# Patient Record
Sex: Male | Born: 1945 | Hispanic: Yes | Smoking: Never smoker
Health system: Southern US, Community
[De-identification: ages and names within clinical notes are randomized; demographics above are authoritative.]

## PROBLEM LIST (undated history)

## (undated) DIAGNOSIS — G20 Parkinson's disease: Secondary | ICD-10-CM

## (undated) DIAGNOSIS — C8590 Non-Hodgkin lymphoma, unspecified, unspecified site: Secondary | ICD-10-CM

## (undated) DIAGNOSIS — G20A1 Parkinson's disease without dyskinesia, without mention of fluctuations: Secondary | ICD-10-CM

## (undated) DIAGNOSIS — I10 Essential (primary) hypertension: Secondary | ICD-10-CM

---

## 2020-01-17 ENCOUNTER — Encounter (HOSPITAL_BASED_OUTPATIENT_CLINIC_OR_DEPARTMENT_OTHER): Payer: Self-pay | Admitting: *Deleted

## 2020-01-17 ENCOUNTER — Other Ambulatory Visit: Payer: Self-pay

## 2020-01-17 ENCOUNTER — Emergency Department (HOSPITAL_BASED_OUTPATIENT_CLINIC_OR_DEPARTMENT_OTHER): Payer: Medicare Other

## 2020-01-17 ENCOUNTER — Inpatient Hospital Stay (HOSPITAL_BASED_OUTPATIENT_CLINIC_OR_DEPARTMENT_OTHER)
Admission: EM | Admit: 2020-01-17 | Discharge: 2020-02-03 | DRG: 871 | Disposition: E | Payer: Medicare Other | Attending: Internal Medicine | Admitting: Internal Medicine

## 2020-01-17 DIAGNOSIS — R1310 Dysphagia, unspecified: Secondary | ICD-10-CM | POA: Diagnosis not present

## 2020-01-17 DIAGNOSIS — B3781 Candidal esophagitis: Secondary | ICD-10-CM | POA: Diagnosis not present

## 2020-01-17 DIAGNOSIS — U071 COVID-19: Secondary | ICD-10-CM | POA: Diagnosis not present

## 2020-01-17 DIAGNOSIS — I82462 Acute embolism and thrombosis of left calf muscular vein: Secondary | ICD-10-CM | POA: Diagnosis not present

## 2020-01-17 DIAGNOSIS — N179 Acute kidney failure, unspecified: Secondary | ICD-10-CM | POA: Diagnosis present

## 2020-01-17 DIAGNOSIS — E43 Unspecified severe protein-calorie malnutrition: Secondary | ICD-10-CM | POA: Insufficient documentation

## 2020-01-17 DIAGNOSIS — J1282 Pneumonia due to coronavirus disease 2019: Secondary | ICD-10-CM | POA: Diagnosis present

## 2020-01-17 DIAGNOSIS — A419 Sepsis, unspecified organism: Secondary | ICD-10-CM | POA: Diagnosis present

## 2020-01-17 DIAGNOSIS — R627 Adult failure to thrive: Secondary | ICD-10-CM | POA: Diagnosis present

## 2020-01-17 DIAGNOSIS — E860 Dehydration: Secondary | ICD-10-CM | POA: Diagnosis present

## 2020-01-17 DIAGNOSIS — G9349 Other encephalopathy: Secondary | ICD-10-CM | POA: Diagnosis not present

## 2020-01-17 DIAGNOSIS — N281 Cyst of kidney, acquired: Secondary | ICD-10-CM | POA: Diagnosis present

## 2020-01-17 DIAGNOSIS — E875 Hyperkalemia: Secondary | ICD-10-CM | POA: Diagnosis not present

## 2020-01-17 DIAGNOSIS — B370 Candidal stomatitis: Secondary | ICD-10-CM | POA: Diagnosis not present

## 2020-01-17 DIAGNOSIS — I5032 Chronic diastolic (congestive) heart failure: Secondary | ICD-10-CM | POA: Diagnosis present

## 2020-01-17 DIAGNOSIS — R6520 Severe sepsis without septic shock: Secondary | ICD-10-CM | POA: Diagnosis present

## 2020-01-17 DIAGNOSIS — D72823 Leukemoid reaction: Secondary | ICD-10-CM | POA: Diagnosis not present

## 2020-01-17 DIAGNOSIS — R680 Hypothermia, not associated with low environmental temperature: Secondary | ICD-10-CM | POA: Diagnosis not present

## 2020-01-17 DIAGNOSIS — Z781 Physical restraint status: Secondary | ICD-10-CM

## 2020-01-17 DIAGNOSIS — Z515 Encounter for palliative care: Secondary | ICD-10-CM

## 2020-01-17 DIAGNOSIS — D696 Thrombocytopenia, unspecified: Secondary | ICD-10-CM | POA: Diagnosis not present

## 2020-01-17 DIAGNOSIS — Z8572 Personal history of non-Hodgkin lymphomas: Secondary | ICD-10-CM

## 2020-01-17 DIAGNOSIS — J9601 Acute respiratory failure with hypoxia: Secondary | ICD-10-CM | POA: Diagnosis present

## 2020-01-17 DIAGNOSIS — Z0189 Encounter for other specified special examinations: Secondary | ICD-10-CM

## 2020-01-17 DIAGNOSIS — I110 Hypertensive heart disease with heart failure: Secondary | ICD-10-CM | POA: Diagnosis present

## 2020-01-17 DIAGNOSIS — D72829 Elevated white blood cell count, unspecified: Secondary | ICD-10-CM | POA: Diagnosis not present

## 2020-01-17 DIAGNOSIS — Z6827 Body mass index (BMI) 27.0-27.9, adult: Secondary | ICD-10-CM

## 2020-01-17 DIAGNOSIS — K922 Gastrointestinal hemorrhage, unspecified: Secondary | ICD-10-CM | POA: Diagnosis not present

## 2020-01-17 DIAGNOSIS — J189 Pneumonia, unspecified organism: Secondary | ICD-10-CM

## 2020-01-17 DIAGNOSIS — G9341 Metabolic encephalopathy: Secondary | ICD-10-CM | POA: Diagnosis not present

## 2020-01-17 DIAGNOSIS — Z66 Do not resuscitate: Secondary | ICD-10-CM | POA: Diagnosis not present

## 2020-01-17 DIAGNOSIS — K7290 Hepatic failure, unspecified without coma: Secondary | ICD-10-CM | POA: Diagnosis present

## 2020-01-17 DIAGNOSIS — I499 Cardiac arrhythmia, unspecified: Secondary | ICD-10-CM | POA: Diagnosis not present

## 2020-01-17 DIAGNOSIS — J9690 Respiratory failure, unspecified, unspecified whether with hypoxia or hypercapnia: Secondary | ICD-10-CM

## 2020-01-17 DIAGNOSIS — G20A1 Parkinson's disease without dyskinesia, without mention of fluctuations: Secondary | ICD-10-CM | POA: Diagnosis present

## 2020-01-17 DIAGNOSIS — Z79899 Other long term (current) drug therapy: Secondary | ICD-10-CM

## 2020-01-17 DIAGNOSIS — I10 Essential (primary) hypertension: Secondary | ICD-10-CM | POA: Diagnosis not present

## 2020-01-17 DIAGNOSIS — Y95 Nosocomial condition: Secondary | ICD-10-CM | POA: Diagnosis present

## 2020-01-17 DIAGNOSIS — R0602 Shortness of breath: Secondary | ICD-10-CM

## 2020-01-17 DIAGNOSIS — Z800 Family history of malignant neoplasm of digestive organs: Secondary | ICD-10-CM

## 2020-01-17 DIAGNOSIS — I4891 Unspecified atrial fibrillation: Secondary | ICD-10-CM | POA: Diagnosis not present

## 2020-01-17 DIAGNOSIS — A4189 Other specified sepsis: Principal | ICD-10-CM | POA: Diagnosis present

## 2020-01-17 DIAGNOSIS — Z4659 Encounter for fitting and adjustment of other gastrointestinal appliance and device: Secondary | ICD-10-CM

## 2020-01-17 DIAGNOSIS — E870 Hyperosmolality and hypernatremia: Secondary | ICD-10-CM | POA: Diagnosis not present

## 2020-01-17 DIAGNOSIS — I82811 Embolism and thrombosis of superficial veins of right lower extremities: Secondary | ICD-10-CM | POA: Diagnosis not present

## 2020-01-17 DIAGNOSIS — Z91041 Radiographic dye allergy status: Secondary | ICD-10-CM

## 2020-01-17 DIAGNOSIS — Z452 Encounter for adjustment and management of vascular access device: Secondary | ICD-10-CM

## 2020-01-17 DIAGNOSIS — G20 Parkinson's disease: Secondary | ICD-10-CM | POA: Diagnosis present

## 2020-01-17 DIAGNOSIS — E861 Hypovolemia: Secondary | ICD-10-CM | POA: Diagnosis not present

## 2020-01-17 DIAGNOSIS — R6521 Severe sepsis with septic shock: Secondary | ICD-10-CM | POA: Diagnosis not present

## 2020-01-17 HISTORY — DX: Essential (primary) hypertension: I10

## 2020-01-17 HISTORY — DX: Parkinson's disease: G20

## 2020-01-17 HISTORY — DX: Non-Hodgkin lymphoma, unspecified, unspecified site: C85.90

## 2020-01-17 HISTORY — DX: Parkinson's disease without dyskinesia, without mention of fluctuations: G20.A1

## 2020-01-17 LAB — CBC WITH DIFFERENTIAL/PLATELET
Abs Immature Granulocytes: 0.1 10*3/uL — ABNORMAL HIGH (ref 0.00–0.07)
Basophils Absolute: 0.1 10*3/uL (ref 0.0–0.1)
Basophils Relative: 0 %
Eosinophils Absolute: 0 10*3/uL (ref 0.0–0.5)
Eosinophils Relative: 0 %
HCT: 43.7 % (ref 39.0–52.0)
Hemoglobin: 15.2 g/dL (ref 13.0–17.0)
Immature Granulocytes: 1 %
Lymphocytes Relative: 4 %
Lymphs Abs: 0.6 10*3/uL — ABNORMAL LOW (ref 0.7–4.0)
MCH: 32.5 pg (ref 26.0–34.0)
MCHC: 34.8 g/dL (ref 30.0–36.0)
MCV: 93.4 fL (ref 80.0–100.0)
Monocytes Absolute: 1 10*3/uL (ref 0.1–1.0)
Monocytes Relative: 7 %
Neutro Abs: 13.2 10*3/uL — ABNORMAL HIGH (ref 1.7–7.7)
Neutrophils Relative %: 88 %
Platelets: 198 10*3/uL (ref 150–400)
RBC: 4.68 MIL/uL (ref 4.22–5.81)
RDW: 13.9 % (ref 11.5–15.5)
WBC: 15 10*3/uL — ABNORMAL HIGH (ref 4.0–10.5)
nRBC: 0 % (ref 0.0–0.2)

## 2020-01-17 LAB — D-DIMER, QUANTITATIVE: D-Dimer, Quant: 1.28 ug/mL-FEU — ABNORMAL HIGH (ref 0.00–0.50)

## 2020-01-17 LAB — FIBRINOGEN: Fibrinogen: >800 mg/dL — ABNORMAL HIGH (ref 210–475)

## 2020-01-17 LAB — COMPREHENSIVE METABOLIC PANEL
ALT: 6 U/L (ref 0–44)
AST: 40 U/L (ref 15–41)
Albumin: 3.7 g/dL (ref 3.5–5.0)
Alkaline Phosphatase: 175 U/L — ABNORMAL HIGH (ref 38–126)
Anion gap: 16 — ABNORMAL HIGH (ref 5–15)
BUN: 30 mg/dL — ABNORMAL HIGH (ref 8–23)
CO2: 19 mmol/L — ABNORMAL LOW (ref 22–32)
Calcium: 8.8 mg/dL — ABNORMAL LOW (ref 8.9–10.3)
Chloride: 98 mmol/L (ref 98–111)
Creatinine, Ser: 1.36 mg/dL — ABNORMAL HIGH (ref 0.61–1.24)
GFR calc Af Amer: 59 mL/min — ABNORMAL LOW (ref >60)
GFR calc non Af Amer: 51 mL/min — ABNORMAL LOW (ref >60)
Glucose, Bld: 162 mg/dL — ABNORMAL HIGH (ref 70–99)
Potassium: 3.7 mmol/L (ref 3.5–5.1)
Sodium: 133 mmol/L — ABNORMAL LOW (ref 135–145)
Total Bilirubin: 2.1 mg/dL — ABNORMAL HIGH (ref 0.3–1.2)
Total Protein: 7.5 g/dL (ref 6.5–8.1)

## 2020-01-17 LAB — LACTIC ACID, PLASMA
Lactic Acid, Venous: 4.3 mmol/L (ref 0.5–1.9)
Lactic Acid, Venous: 1.8 mmol/L (ref 0.5–1.9)

## 2020-01-17 LAB — TRIGLYCERIDES: Triglycerides: 87 mg/dL (ref <150)

## 2020-01-17 LAB — PROCALCITONIN: Procalcitonin: 2.55 ng/mL

## 2020-01-17 LAB — C-REACTIVE PROTEIN: CRP: 34.6 mg/dL — ABNORMAL HIGH (ref <1.0)

## 2020-01-17 LAB — SARS CORONAVIRUS 2 AG (30 MIN TAT): SARS Coronavirus 2 Ag: POSITIVE — AB

## 2020-01-17 LAB — LACTATE DEHYDROGENASE: LDH: 327 U/L — ABNORMAL HIGH (ref 98–192)

## 2020-01-17 LAB — FERRITIN: Ferritin: 571 ng/mL — ABNORMAL HIGH (ref 24–336)

## 2020-01-17 LAB — BRAIN NATRIURETIC PEPTIDE: B Natriuretic Peptide: 154.9 pg/mL — ABNORMAL HIGH (ref 0.0–100.0)

## 2020-01-17 MED ORDER — SODIUM CHLORIDE 0.9 % IV SOLN
100.0000 mg | Freq: Every day | INTRAVENOUS | Status: AC
  Administered 2020-01-18 – 2020-01-21 (×4): 100 mg via INTRAVENOUS
  Filled 2020-01-17 (×4): qty 20

## 2020-01-17 MED ORDER — SODIUM CHLORIDE 0.9 % IV BOLUS (SEPSIS)
500.0000 mL | Freq: Once | INTRAVENOUS | Status: AC
  Administered 2020-01-17: 500 mL via INTRAVENOUS

## 2020-01-17 MED ORDER — SODIUM CHLORIDE 0.9 % IV SOLN
100.0000 mg | INTRAVENOUS | Status: AC
  Administered 2020-01-17 (×2): 100 mg via INTRAVENOUS
  Filled 2020-01-17 (×2): qty 20

## 2020-01-17 MED ORDER — SODIUM CHLORIDE 0.9 % IV SOLN
1000.0000 mL | INTRAVENOUS | Status: DC
  Administered 2020-01-17: 1000 mL via INTRAVENOUS

## 2020-01-17 MED ORDER — ACETAMINOPHEN 500 MG PO TABS
ORAL_TABLET | ORAL | Status: AC
  Filled 2020-01-17: qty 2

## 2020-01-17 MED ORDER — ACETAMINOPHEN 500 MG PO TABS
1000.0000 mg | ORAL_TABLET | Freq: Once | ORAL | Status: AC
  Administered 2020-01-17: 1000 mg via ORAL

## 2020-01-17 MED ORDER — DEXAMETHASONE SODIUM PHOSPHATE 10 MG/ML IJ SOLN
10.0000 mg | Freq: Once | INTRAMUSCULAR | Status: AC
  Administered 2020-01-17: 10 mg via INTRAVENOUS
  Filled 2020-01-17: qty 1

## 2020-01-17 NOTE — ED Triage Notes (Signed)
Daughter states family at home with Covid, pts SOB x 2 days

## 2020-01-17 NOTE — Progress Notes (Signed)
Patient's SPO2 is 84%, and RR of 40 while on4 liter nasal upon arrival to room 11.  Placed patient on 10 liter Hi-Flo nasal cannula.  SPO2 increased to 97% and RR decreased to 30.  Patient's WOB has also decreased.

## 2020-01-17 NOTE — ED Provider Notes (Signed)
[facility] EMERGENCY DEPARTMENT Provider Note   CSN: [id number] Arrival date & time: 01/23/2020  1921     History Chief Complaint  Patient presents with  . Shortness of Breath    Christopher Travis is a 74 y.o. male.  HPI Patient moved to the [place] area from [place] about 3 weeks ago.  He is family from [place] came to help with the move.  Unfortunately, his daughter had contracted Covid from her daughter just before coming to their home.  All the family members have now contracted Covid.  Patient reports symptoms started about 5 to 7 days ago.  He has had a harsh cough.  He denies significant amount of chest pain.  He has had fever and body aches.  He denies he has been vomiting.  He reports he is continue to eat small amounts.  He has had some associated diarrhea.  Patient's daughter reports they have been using the pulse oximeter and his oxygen level has started to go down at home when he was getting down to the lower 80s.  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  He is treated for Parkinson's disease.  At baseline however he is active and living independently with his wife.  The majority of patients healthcare records are within the [facility].    Past Medical History:  Diagnosis Date  . Hypertension   . Non Hodgkin's lymphoma ([place])   . Parkinson disease ([place])     There are no problems to display for this patient.   History reviewed. No pertinent surgical history.     No family history on file.  Social History   Tobacco Use  . Smoking status: Not on file  Substance Use Topics  . Alcohol use: Not on file  . Drug use: Not on file    Home Medications Prior to Admission medications   Medication Sig Start Date End Date Taking? Authorizing Provider  amLODipine-valsartan (EXFORGE) 10-320 MG tablet Take 1 tablet by mouth daily.   Yes [provider]  carbidopa-levodopa (SINEMET IR) 25-100 MG  tablet Take 1 tablet by mouth 3 (three) times daily.   Yes [provider]  rOPINIRole (REQUIP) 2 MG tablet Take 2 mg by mouth at bedtime.   Yes [provider]    Allergies    Patient has no known allergies.  Review of Systems   Review of Systems 10 Systems reviewed and are negative for acute change except as noted in the HPI.  Physical Exam Updated Vital Signs BP (!) 141/77   Pulse 88   Temp (!) 102.5 F (39.2 C) (Oral)   Resp (!) 26   Ht 5\' 11"  (1.803 m)   Wt 88.9 kg   SpO2 99%   BMI 27.34 kg/m   Physical Exam Constitutional:      Comments: Patient is alert.  Mental status clear.  Moderate tachypnea.  Speaking in full short sentences.  HENT:     Head: Normocephalic and atraumatic.  Eyes:     Extraocular Movements: Extraocular movements intact.  Cardiovascular:     Comments: Tachycardia.  No gross rub murmur gallop. Pulmonary:     Comments: Mild to moderate increased work of breathing.  Crackles diffusely throughout the lung fields. Abdominal:     General: There is no distension.     Palpations: Abdomen is soft.     Tenderness: There is no abdominal tenderness. There is no guarding.  Musculoskeletal:     Comments: Large varicosity of the right lower extremity.  Brawny skin changes of the lower leg consistent with chronic venous stasis.  Left leg normal.  Skin:    General: Skin is warm and dry.  Neurological:     General: No focal deficit present.     Mental Status: He is oriented to person, place, and time.     Coordination: Coordination normal.  Psychiatric:        Mood and Affect: Mood normal.     ED Results / Procedures / Treatments   Labs (all labs ordered are listed, but only abnormal results are displayed) Labs Reviewed  SARS CORONAVIRUS 2 AG (30 MIN TAT) - Abnormal; Notable for the following components:      Result Value   SARS Coronavirus 2 Ag POSITIVE (*)    All other components within normal limits  CBC WITH  DIFFERENTIAL/PLATELET - Abnormal; Notable for the following components:   WBC 15.0 (*)    Neutro Abs 13.2 (*)    Lymphs Abs 0.6 (*)    Abs Immature Granulocytes 0.10 (*)    All other components within normal limits  COMPREHENSIVE METABOLIC PANEL - Abnormal; Notable for the following components:   Sodium 133 (*)    CO2 19 (*)    Glucose, Bld 162 (*)    BUN 30 (*)    Creatinine, Ser 1.36 (*)    Calcium 8.8 (*)    Alkaline Phosphatase 175 (*)    Total Bilirubin 2.1 (*)    GFR calc non Af Amer 51 (*)    GFR calc Af Amer 59 (*)    Anion gap 16 (*)    All other components within normal limits  LACTIC ACID, PLASMA - Abnormal; Notable for the following components:   Lactic Acid, Venous 4.3 (*)    All other components within normal limits  D-DIMER, QUANTITATIVE (NOT AT [facility]) - Abnormal; Notable for the following components:   D-Dimer, Quant 1.28 (*)    All other components within normal limits  CULTURE, BLOOD (ROUTINE X 2)  CULTURE, BLOOD (ROUTINE X 2)  LACTIC ACID, PLASMA  PROCALCITONIN  LACTATE DEHYDROGENASE  FERRITIN  TRIGLYCERIDES  FIBRINOGEN  C-REACTIVE PROTEIN  BRAIN NATRIURETIC PEPTIDE    EKG None EKG will not import from use.  Sinus rhythm 98.  PACs.  No acute ischemic changes. No old comparison available. Ventricular rate 98 QRS 96 QTC 460 Radiolo DG Chest Portable 1 View  Result Date: 01/18/2020 CLINICAL DATA:  Shortness of breath. COVID exposure at home. EXAM: PORTABLE CHEST 1 VIEW COMPARISON:  None. FINDINGS: Heterogeneous patchy bilateral airspace opacities, left greater than right. Heart is normal in size with normal mediastinal contours. No pleural fluid or pneumothorax. No acute osseous abnormalities are seen. IMPRESSION: Heterogeneous patchy bilateral airspace opacities, left greater than right. Findings suspicious for multifocal pneumonia, pattern typical of COVID pneumonia. Electronically Signed   By: Keith Rake M.D.   On: 01/22/2020 20:13     Procedures Procedures (including critical care time) CRITICAL CARE Performed by: Charlesetta Shanks   Total critical care time: 30 minutes  Critical care time was exclusive of separately billable procedures and treating other patients.  Critical care was necessary to treat or prevent imminent or life-threatening deterioration.  Critical care was time spent personally by me on the following activities: development of treatment plan with patient and/or surrogate as well as nursing, discussions with consultants, evaluation of patient's response to treatment, examination of patient, obtaining  history from patient or surrogate, ordering and performing treatments and interventions, ordering and review of laboratory studies, ordering and review of radiographic studies, pulse oximetry and re-evaluation of patient's condition. Medications Ordered in ED Medications  sodium chloride 0.9 % bolus 500 mL (500 mLs Intravenous New Bag/Given 02/02/2020 2032)    Followed by  0.9 %  sodium chloride infusion (has no administration in time range)  dexamethasone (DECADRON) injection 10 mg (has no administration in time range)  acetaminophen (TYLENOL) tablet 1,000 mg (1,000 mg Oral Given 01/10/2020 1937)    ED Course  I have reviewed the triage vital signs and the nursing notes.  Pertinent labs & imaging results that were available during my care of the patient were reviewed by me and considered in my medical decision making (see chart for details).    MDM Rules/Calculators/A&P                     Patient presents with symptoms of Covid pneumonia.  He has had direct exposure to positive family members.  Patient is hypoxic on arrival is 78%.  He has improved on high flow supplemental oxygen.  Patient's mental status is clear.  Chest x-ray is consistent with bilateral pneumonia.  Patient is started on remdesivir and Decadron in the emergency department.  Lactic acid is elevated and there is some renal  insufficiency.  Patient is given a 500 cc saline bolus and fluids started at 125 an hour.  Will repeat lactic acid.  Patient's mental status is good.  Extremities are warm and dry.  Heart rate and blood pressure are normal.  Patient has history of non-Hodgkin's lymphoma which has been in remission for greater than 3 years.  Patient has Parkinson's disease but lives independently with his wife and recently moved into a new home in the [place] area.  I reviewed the plan with patient's daughter Chrys Racer.  Her number is [phone].  She wants updates when available.  Patient will be admitted to [facility]. Final Clinical Impression(s) / ED Diagnoses Final diagnoses:  Pneumonia due to COVID-19 virus    Rx / DC Orders ED Discharge Orders    None       Charlesetta Shanks, MD 01/16/2020 2146

## 2020-01-17 NOTE — ED Notes (Signed)
ED Provider at bedside. 

## 2020-01-17 NOTE — ED Notes (Signed)
Date and time results received: 01/14/2020 2018 (use smartphrase ".now" to insert current time)  Test: lactic acid Critical Value: 4.3  Name of Provider Notified: Dr. Johnney Killian  Orders Received? Or Actions Taken?: no new orders

## 2020-01-17 NOTE — ED Notes (Signed)
Notified daughter by Phone of pts condition and plan.

## 2020-01-17 NOTE — ED Notes (Signed)
Pfifer MD made aware of pts condition and 02 sat

## 2020-01-18 ENCOUNTER — Encounter (HOSPITAL_COMMUNITY): Payer: Self-pay | Admitting: Family Medicine

## 2020-01-18 ENCOUNTER — Telehealth (HOSPITAL_BASED_OUTPATIENT_CLINIC_OR_DEPARTMENT_OTHER): Payer: Self-pay | Admitting: Emergency Medicine

## 2020-01-18 DIAGNOSIS — I110 Hypertensive heart disease with heart failure: Secondary | ICD-10-CM | POA: Diagnosis present

## 2020-01-18 DIAGNOSIS — G20 Parkinson's disease: Secondary | ICD-10-CM | POA: Diagnosis not present

## 2020-01-18 DIAGNOSIS — I10 Essential (primary) hypertension: Secondary | ICD-10-CM | POA: Diagnosis not present

## 2020-01-18 DIAGNOSIS — G20A1 Parkinson's disease without dyskinesia, without mention of fluctuations: Secondary | ICD-10-CM | POA: Diagnosis present

## 2020-01-18 DIAGNOSIS — E43 Unspecified severe protein-calorie malnutrition: Secondary | ICD-10-CM | POA: Diagnosis present

## 2020-01-18 DIAGNOSIS — N179 Acute kidney failure, unspecified: Secondary | ICD-10-CM | POA: Diagnosis present

## 2020-01-18 DIAGNOSIS — B370 Candidal stomatitis: Secondary | ICD-10-CM | POA: Diagnosis not present

## 2020-01-18 DIAGNOSIS — Y95 Nosocomial condition: Secondary | ICD-10-CM | POA: Diagnosis present

## 2020-01-18 DIAGNOSIS — J9601 Acute respiratory failure with hypoxia: Secondary | ICD-10-CM | POA: Diagnosis present

## 2020-01-18 DIAGNOSIS — Z800 Family history of malignant neoplasm of digestive organs: Secondary | ICD-10-CM | POA: Diagnosis not present

## 2020-01-18 DIAGNOSIS — Z8572 Personal history of non-Hodgkin lymphomas: Secondary | ICD-10-CM | POA: Diagnosis not present

## 2020-01-18 DIAGNOSIS — Z515 Encounter for palliative care: Secondary | ICD-10-CM | POA: Diagnosis not present

## 2020-01-18 DIAGNOSIS — E870 Hyperosmolality and hypernatremia: Secondary | ICD-10-CM | POA: Diagnosis not present

## 2020-01-18 DIAGNOSIS — U071 COVID-19: Secondary | ICD-10-CM | POA: Diagnosis present

## 2020-01-18 DIAGNOSIS — G9349 Other encephalopathy: Secondary | ICD-10-CM | POA: Diagnosis not present

## 2020-01-18 DIAGNOSIS — R7989 Other specified abnormal findings of blood chemistry: Secondary | ICD-10-CM | POA: Diagnosis not present

## 2020-01-18 DIAGNOSIS — I82811 Embolism and thrombosis of superficial veins of right lower extremities: Secondary | ICD-10-CM | POA: Diagnosis not present

## 2020-01-18 DIAGNOSIS — Z7189 Other specified counseling: Secondary | ICD-10-CM | POA: Diagnosis not present

## 2020-01-18 DIAGNOSIS — R6521 Severe sepsis with septic shock: Secondary | ICD-10-CM | POA: Diagnosis not present

## 2020-01-18 DIAGNOSIS — G9341 Metabolic encephalopathy: Secondary | ICD-10-CM | POA: Diagnosis not present

## 2020-01-18 DIAGNOSIS — N171 Acute kidney failure with acute cortical necrosis: Secondary | ICD-10-CM | POA: Diagnosis not present

## 2020-01-18 DIAGNOSIS — A4189 Other specified sepsis: Secondary | ICD-10-CM | POA: Diagnosis present

## 2020-01-18 DIAGNOSIS — A419 Sepsis, unspecified organism: Secondary | ICD-10-CM | POA: Diagnosis present

## 2020-01-18 DIAGNOSIS — J1282 Pneumonia due to coronavirus disease 2019: Secondary | ICD-10-CM | POA: Diagnosis present

## 2020-01-18 DIAGNOSIS — K922 Gastrointestinal hemorrhage, unspecified: Secondary | ICD-10-CM | POA: Diagnosis not present

## 2020-01-18 DIAGNOSIS — I82462 Acute embolism and thrombosis of left calf muscular vein: Secondary | ICD-10-CM | POA: Diagnosis not present

## 2020-01-18 DIAGNOSIS — R9431 Abnormal electrocardiogram [ECG] [EKG]: Secondary | ICD-10-CM | POA: Diagnosis not present

## 2020-01-18 DIAGNOSIS — I5032 Chronic diastolic (congestive) heart failure: Secondary | ICD-10-CM | POA: Diagnosis present

## 2020-01-18 DIAGNOSIS — J189 Pneumonia, unspecified organism: Secondary | ICD-10-CM | POA: Diagnosis not present

## 2020-01-18 DIAGNOSIS — Z66 Do not resuscitate: Secondary | ICD-10-CM | POA: Diagnosis not present

## 2020-01-18 DIAGNOSIS — B3781 Candidal esophagitis: Secondary | ICD-10-CM | POA: Diagnosis not present

## 2020-01-18 DIAGNOSIS — Z6827 Body mass index (BMI) 27.0-27.9, adult: Secondary | ICD-10-CM | POA: Diagnosis not present

## 2020-01-18 DIAGNOSIS — Z79899 Other long term (current) drug therapy: Secondary | ICD-10-CM | POA: Diagnosis not present

## 2020-01-18 LAB — COMPREHENSIVE METABOLIC PANEL
ALT: 8 U/L (ref 0–44)
AST: 32 U/L (ref 15–41)
Albumin: 3.3 g/dL — ABNORMAL LOW (ref 3.5–5.0)
Alkaline Phosphatase: 147 U/L — ABNORMAL HIGH (ref 38–126)
Anion gap: 11 (ref 5–15)
BUN: 32 mg/dL — ABNORMAL HIGH (ref 8–23)
CO2: 21 mmol/L — ABNORMAL LOW (ref 22–32)
Calcium: 8.4 mg/dL — ABNORMAL LOW (ref 8.9–10.3)
Chloride: 105 mmol/L (ref 98–111)
Creatinine, Ser: 1.05 mg/dL (ref 0.61–1.24)
GFR calc Af Amer: >60 mL/min (ref >60)
GFR calc non Af Amer: >60 mL/min (ref >60)
Glucose, Bld: 191 mg/dL — ABNORMAL HIGH (ref 70–99)
Potassium: 4.1 mmol/L (ref 3.5–5.1)
Sodium: 137 mmol/L (ref 135–145)
Total Bilirubin: 1 mg/dL (ref 0.3–1.2)
Total Protein: 6.8 g/dL (ref 6.5–8.1)

## 2020-01-18 LAB — CBC WITH DIFFERENTIAL/PLATELET
Abs Immature Granulocytes: 0.05 10*3/uL (ref 0.00–0.07)
Basophils Absolute: 0 10*3/uL (ref 0.0–0.1)
Basophils Relative: 0 %
Eosinophils Absolute: 0 10*3/uL (ref 0.0–0.5)
Eosinophils Relative: 0 %
HCT: 41.1 % (ref 39.0–52.0)
Hemoglobin: 14.3 g/dL (ref 13.0–17.0)
Immature Granulocytes: 1 %
Lymphocytes Relative: 3 %
Lymphs Abs: 0.3 10*3/uL — ABNORMAL LOW (ref 0.7–4.0)
MCH: 32.1 pg (ref 26.0–34.0)
MCHC: 34.8 g/dL (ref 30.0–36.0)
MCV: 92.4 fL (ref 80.0–100.0)
Monocytes Absolute: 0.4 10*3/uL (ref 0.1–1.0)
Monocytes Relative: 4 %
Neutro Abs: 9.4 10*3/uL — ABNORMAL HIGH (ref 1.7–7.7)
Neutrophils Relative %: 92 %
Platelets: 137 10*3/uL — ABNORMAL LOW (ref 150–400)
RBC: 4.45 MIL/uL (ref 4.22–5.81)
RDW: 13.8 % (ref 11.5–15.5)
WBC: 10.2 10*3/uL (ref 4.0–10.5)
nRBC: 0 % (ref 0.0–0.2)

## 2020-01-18 LAB — GLUCOSE, CAPILLARY
Glucose-Capillary: 182 mg/dL — ABNORMAL HIGH (ref 70–99)
Glucose-Capillary: 155 mg/dL — ABNORMAL HIGH (ref 70–99)
Glucose-Capillary: 221 mg/dL — ABNORMAL HIGH (ref 70–99)
Glucose-Capillary: 120 mg/dL — ABNORMAL HIGH (ref 70–99)

## 2020-01-18 LAB — D-DIMER, QUANTITATIVE: D-Dimer, Quant: 1.08 ug/mL-FEU — ABNORMAL HIGH (ref 0.00–0.50)

## 2020-01-18 LAB — C-REACTIVE PROTEIN: CRP: 31 mg/dL — ABNORMAL HIGH (ref <1.0)

## 2020-01-18 LAB — ABO/RH: ABO/RH(D): A POS

## 2020-01-18 LAB — FERRITIN: Ferritin: 604 ng/mL — ABNORMAL HIGH (ref 24–336)

## 2020-01-18 LAB — PROCALCITONIN: Procalcitonin: 3.7 ng/mL

## 2020-01-18 MED ORDER — ONDANSETRON HCL 4 MG/2ML IJ SOLN
4.0000 mg | Freq: Four times a day (QID) | INTRAMUSCULAR | Status: DC | PRN
  Administered 2020-01-27: 09:00:00 4 mg via INTRAVENOUS
  Filled 2020-01-18: qty 2

## 2020-01-18 MED ORDER — SODIUM CHLORIDE 0.9 % IV BOLUS
250.0000 mL | Freq: Once | INTRAVENOUS | Status: AC
  Administered 2020-01-18: 250 mL via INTRAVENOUS

## 2020-01-18 MED ORDER — SODIUM CHLORIDE 0.9 % IV SOLN: 250.0000 mL | INTRAVENOUS | Status: DC | PRN

## 2020-01-18 MED ORDER — SODIUM CHLORIDE 0.9% IV SOLUTION: Freq: Once | INTRAVENOUS | Status: AC

## 2020-01-18 MED ORDER — DEXAMETHASONE SODIUM PHOSPHATE 10 MG/ML IJ SOLN
6.0000 mg | INTRAMUSCULAR | Status: AC
  Administered 2020-01-18 – 2020-01-27 (×10): 6 mg via INTRAVENOUS
  Filled 2020-01-18 (×10): qty 1

## 2020-01-18 MED ORDER — INSULIN ASPART 100 UNIT/ML ~~LOC~~ SOLN
0.0000 [IU] | Freq: Three times a day (TID) | SUBCUTANEOUS | Status: DC
  Administered 2020-01-18: 13:00:00 3 [IU] via SUBCUTANEOUS
  Administered 2020-01-18 – 2020-01-19 (×3): 2 [IU] via SUBCUTANEOUS
  Administered 2020-01-19: 1 [IU] via SUBCUTANEOUS
  Administered 2020-01-20: 2 [IU] via SUBCUTANEOUS
  Administered 2020-01-20: 1 [IU] via SUBCUTANEOUS
  Administered 2020-01-20: 5 [IU] via SUBCUTANEOUS
  Administered 2020-01-21: 2 [IU] via SUBCUTANEOUS
  Administered 2020-01-21 (×2): 3 [IU] via SUBCUTANEOUS
  Administered 2020-01-22: 2 [IU] via SUBCUTANEOUS
  Administered 2020-01-22: 3 [IU] via SUBCUTANEOUS
  Administered 2020-01-22 – 2020-01-23 (×2): 2 [IU] via SUBCUTANEOUS
  Administered 2020-01-23: 5 [IU] via SUBCUTANEOUS
  Administered 2020-01-23 – 2020-01-25 (×5): 2 [IU] via SUBCUTANEOUS
  Administered 2020-01-25: 3 [IU] via SUBCUTANEOUS
  Administered 2020-01-25: 2 [IU] via SUBCUTANEOUS
  Administered 2020-01-26 (×3): 1 [IU] via SUBCUTANEOUS
  Administered 2020-01-27 (×3): 2 [IU] via SUBCUTANEOUS
  Administered 2020-01-28: 1 [IU] via SUBCUTANEOUS
  Administered 2020-01-28: 2 [IU] via SUBCUTANEOUS
  Administered 2020-01-28: 17:00:00 1 [IU] via SUBCUTANEOUS
  Administered 2020-01-29: 08:00:00 2 [IU] via SUBCUTANEOUS
  Administered 2020-01-29 (×2): 1 [IU] via SUBCUTANEOUS

## 2020-01-18 MED ORDER — CARBIDOPA-LEVODOPA 25-100 MG PO TABS
1.0000 | ORAL_TABLET | Freq: Once | ORAL | Status: AC
  Administered 2020-01-18: 1 via ORAL
  Filled 2020-01-18: qty 1

## 2020-01-18 MED ORDER — ROPINIROLE HCL 1 MG PO TABS
3.0000 mg | ORAL_TABLET | ORAL | Status: DC
  Administered 2020-01-19 – 2020-01-28 (×7): 3 mg via ORAL
  Filled 2020-01-18 (×16): qty 3

## 2020-01-18 MED ORDER — CARBIDOPA-LEVODOPA 25-100 MG PO TABS: 1.0000 | ORAL_TABLET | Freq: Three times a day (TID) | ORAL | Status: DC

## 2020-01-18 MED ORDER — CARBIDOPA-LEVODOPA 25-100 MG PO TABS
2.0000 | ORAL_TABLET | ORAL | Status: DC
  Administered 2020-01-18 – 2020-01-26 (×17): 2 via ORAL
  Filled 2020-01-18 (×31): qty 2

## 2020-01-18 MED ORDER — GUAIFENESIN-DM 100-10 MG/5ML PO SYRP
10.0000 mL | ORAL_SOLUTION | ORAL | Status: DC | PRN
  Administered 2020-01-18 – 2020-01-20 (×2): 10 mL via ORAL
  Filled 2020-01-18 (×2): qty 10

## 2020-01-18 MED ORDER — SENNOSIDES-DOCUSATE SODIUM 8.6-50 MG PO TABS: 1.0000 | ORAL_TABLET | Freq: Every evening | ORAL | Status: DC | PRN

## 2020-01-18 MED ORDER — CARBIDOPA-LEVODOPA 25-100 MG PO TABS
1.0000 | ORAL_TABLET | Freq: Three times a day (TID) | ORAL | Status: DC
  Filled 2020-01-18 (×2): qty 1

## 2020-01-18 MED ORDER — CARBIDOPA-LEVODOPA 25-100 MG PO TABS
1.0000 | ORAL_TABLET | Freq: Every day | ORAL | Status: DC
  Administered 2020-01-18 – 2020-01-22 (×5): 1 via ORAL
  Filled 2020-01-18 (×10): qty 1

## 2020-01-18 MED ORDER — SODIUM CHLORIDE 0.9 % IV SOLN
2.0000 g | INTRAVENOUS | Status: AC
  Administered 2020-01-18 – 2020-01-22 (×5): 2 g via INTRAVENOUS
  Filled 2020-01-18 (×5): qty 20

## 2020-01-18 MED ORDER — CARBIDOPA-LEVODOPA ER 50-200 MG PO TBCR
2.0000 | EXTENDED_RELEASE_TABLET | Freq: Every day | ORAL | Status: DC
  Administered 2020-01-18 – 2020-01-22 (×4): 2 via ORAL
  Filled 2020-01-18 (×9): qty 2

## 2020-01-18 MED ORDER — CARBIDOPA-LEVODOPA 25-100 MG PO TABS
2.0000 | ORAL_TABLET | Freq: Three times a day (TID) | ORAL | Status: DC
  Filled 2020-01-18: qty 2

## 2020-01-18 MED ORDER — ROPINIROLE HCL 1 MG PO TABS
3.0000 mg | ORAL_TABLET | Freq: Once | ORAL | Status: AC
  Administered 2020-01-18: 3 mg via ORAL
  Filled 2020-01-18: qty 3

## 2020-01-18 MED ORDER — ENSURE ENLIVE PO LIQD
237.0000 mL | Freq: Three times a day (TID) | ORAL | Status: DC
  Administered 2020-01-18 – 2020-01-27 (×16): 237 mL via ORAL

## 2020-01-18 MED ORDER — ENOXAPARIN SODIUM 40 MG/0.4ML ~~LOC~~ SOLN
40.0000 mg | SUBCUTANEOUS | Status: DC
  Administered 2020-01-18 – 2020-01-20 (×3): 40 mg via SUBCUTANEOUS
  Filled 2020-01-18 (×3): qty 0.4

## 2020-01-18 MED ORDER — SODIUM CHLORIDE 0.9% FLUSH
3.0000 mL | Freq: Two times a day (BID) | INTRAVENOUS | Status: DC
  Administered 2020-01-18 – 2020-01-29 (×22): 3 mL via INTRAVENOUS

## 2020-01-18 MED ORDER — LIP MEDEX EX OINT
TOPICAL_OINTMENT | CUTANEOUS | Status: DC | PRN
  Administered 2020-01-28: 1 via TOPICAL
  Filled 2020-01-18 (×3): qty 7

## 2020-01-18 MED ORDER — SODIUM CHLORIDE 0.9% FLUSH
3.0000 mL | Freq: Two times a day (BID) | INTRAVENOUS | Status: DC
  Administered 2020-01-18 – 2020-01-29 (×20): 3 mL via INTRAVENOUS

## 2020-01-18 MED ORDER — SODIUM CHLORIDE 0.9 % IV SOLN
500.0000 mg | INTRAVENOUS | Status: AC
  Administered 2020-01-18 – 2020-01-22 (×5): 500 mg via INTRAVENOUS
  Filled 2020-01-18 (×5): qty 500

## 2020-01-18 MED ORDER — ROPINIROLE HCL 1 MG PO TABS
2.0000 mg | ORAL_TABLET | Freq: Every day | ORAL | Status: DC
  Filled 2020-01-18: qty 2

## 2020-01-18 MED ORDER — SODIUM CHLORIDE 0.9% FLUSH: 3.0000 mL | INTRAVENOUS | Status: DC | PRN

## 2020-01-18 MED ORDER — ONDANSETRON HCL 4 MG PO TABS: 4.0000 mg | ORAL_TABLET | Freq: Four times a day (QID) | ORAL | Status: DC | PRN

## 2020-01-18 MED ORDER — ACETAMINOPHEN 325 MG PO TABS: 650.0000 mg | ORAL_TABLET | Freq: Four times a day (QID) | ORAL | Status: DC | PRN

## 2020-01-18 MED ORDER — HYDROCODONE-ACETAMINOPHEN 5-325 MG PO TABS: 1.0000 | ORAL_TABLET | Freq: Four times a day (QID) | ORAL | Status: DC | PRN

## 2020-01-18 MED ORDER — CARBIDOPA-LEVODOPA 25-100 MG PO TABS
1.0000 | ORAL_TABLET | Freq: Three times a day (TID) | ORAL | Status: DC
  Administered 2020-01-18 (×2): 1 via ORAL
  Filled 2020-01-18 (×3): qty 1

## 2020-01-18 NOTE — Progress Notes (Signed)
Called pt daughter to get clarification on pt Sinemet dosage and times. She gave correct dosing and times for all pt home meds. Those doses and times were relayed to MD. He asked that I contact pharmacy to have it corrected. Called pharmacy and gave her the info and pharmacy said they will correct it in [facility].

## 2020-01-18 NOTE — Progress Notes (Signed)
Initial Nutrition Assessment  RD working remotely.  DOCUMENTATION CODES:   Not applicable  INTERVENTION:  Provide Ensure Enlive po TID, each supplement provides 350 kcal and 20 grams of protein.  NUTRITION DIAGNOSIS:   Increased nutrient needs related to catabolic [id number] as evidenced by estimated needs.  GOAL:   Patient will meet greater than or equal to 90% of their needs  MONITOR:   PO intake, Supplement acceptance, Labs, Weight trends, I & O's  REASON FOR ASSESSMENT:   Malnutrition Screening Tool    ASSESSMENT:   74 year old male with PMHx of Parkinson's disease, Non Hodgkin's lymphoma in remission, HTN admitted with PNA due to COVID-19, sepsis, AKI.   Patient is Spanish-speaking. Attempted to call patient with phone interpreter service (ID # [phone]) but patient was unable to answer the phone. Patient is currently on soft diet with 1.2 L fluid restriction. According to chart patient ate 25% of his breakfast and lunch today. Patient has increased calorie/protein needs from COVID-19 and would benefit from oral nutrition supplements.  No weight history available in chart to trend. Patient is currently 85.8 kg (189.15 lbs).  Medications reviewed and include: Decadron 6 mg Q24hrs, Novolog 0-9 units TID, azithromycin, ceftriaxone, remdesivir.  Labs reviewed: CBG 120-221, CO2 21, BUN 32.  NUTRITION - FOCUSED PHYSICAL EXAM:  Unable to complete as RD working remotely.  Diet Order:   Diet Order            DIET SOFT Room service appropriate? Yes; Fluid consistency: Thin; Fluid restriction: 1200 mL Fluid  Diet effective now             EDUCATION NEEDS:   No education needs have been identified at this time  Skin:  Skin Assessment: Reviewed RN Assessment  Last BM:  01/18/2020 medium type 6  Height:   Ht Readings from Last 1 Encounters:  01/18/20 5\' 11"  (1.803 m)   Weight:   Wt Readings from Last 1 Encounters:  01/18/20 85.8 kg   BMI:  Body mass  index is 26.38 kg/m.  Estimated Nutritional Needs:   Kcal:  2000-2200  Protein:  100-110 grams  Fluid:  per MD  Jacklynn Barnacle, MS, RD, LDN Pager number available on Amion

## 2020-01-18 NOTE — Progress Notes (Signed)
[facility] to [employer] at approx 1815.

## 2020-01-18 NOTE — ED Notes (Signed)
Call pt's daughter, [name], with any questions or updates.  [phone]

## 2020-01-18 NOTE — Progress Notes (Signed)
[facility] PROGRESS NOTE    Progress Note  Christopher Travis  NTZ:[id number] DOB: Apr 11, 1946 DOA: 01/11/2020 PCP: System, Pcp Not In     Brief Narrative:   Christopher Travis is an 74 y.o. male past medical history significant for Parkinson's disease and non-Hodgkin's lymphoma in remission who developed subjective fever malaise about 1 week prior to admission, started developing shortness of breath about 2 to 3 days ago at home his sats began reading 80% presented to [facility] found to be hypoxic,  Tachypneic chest x-ray showed bilateral infiltrates, SARS-CoV-2 PCR, his creatinine was 1.3 (up from baseline 1.0) elevated bili Ruben with a leukocytosis lactic acid of 4.3 procalcitonin 2.5 D-dimer 1.28 blood cultures ordered was fluid resuscitated started on IV Decadron on remdesivir.  Assessment/Plan:   Acute respiratory failure with hypoxia 2/2 Pneumonia due to COVID-19 virus and probably community-acquired pneumonia: Currently requiring 10 L of oxygen high flow nasal cannula to keep saturations greater than 88%. Laboratory markers are significantly elevated he was started on IV remdesivir, Decadron Rocephin and azithromycin, he and his family agree with convalescent plasma. His leukocytosis improved he has remained afebrile, lactic acidosis has resolved after fluid. Try to keep the patient prone for at least 16 hours a day, if not prone out of bed to chair, continue to use incentive spirometry and flutter valve, continue strict I's and O's and daily weights. resuscitation Try to keep the patient prone for at least 16 hours a day without prone out of bed to chair.  Sepsis possibly due to community-acquired pneumonia: He had acute renal failure leukocytosis and tachycardia, with an elevated procalcitonin lactate on admission. Likely primary lung infectious blood cultures collected in the ED urine antigens are pending he was started empirically on IV Rocephin and azithromycin. Has  defervesced his leukocytosis improved.  Acute kidney injury: With a baseline creatinine of around 1, on admission 1.3 likely prerenal, resolved with IV fluid hydration.  Hyperbilirubinemia Abdominal exam is benign.  Parkinson's disease continue current medication.  Essential hypertension: Holding antihypertensive medication due to the sepsis picture.   DVT prophylaxis: Lovenox Family Communication:daughter Disposition Plan/Barrier to D/C: Once he is off oxygen has completed his treatment.  Code Status:     Code Status Orders  (From admission, onward)         Start     Ordered   01/18/20 0147  Full code  Continuous     01/18/20 0149        Code Status History    This patient has a current code status but no historical code status.   Advance Care Planning Activity        IV Access:    Peripheral IV   Procedures and diagnostic studies:   DG Chest Portable 1 View  Result Date: 01/23/2020 CLINICAL DATA:  Shortness of breath. COVID exposure at home. EXAM: PORTABLE CHEST 1 VIEW COMPARISON:  None. FINDINGS: Heterogeneous patchy bilateral airspace opacities, left greater than right. Heart is normal in size with normal mediastinal contours. No pleural fluid or pneumothorax. No acute osseous abnormalities are seen. IMPRESSION: Heterogeneous patchy bilateral airspace opacities, left greater than right. Findings suspicious for multifocal pneumonia, pattern typical of COVID pneumonia. Electronically Signed   By: Keith Rake M.D.   On: 01/30/2020 20:13     Medical Consultants:    None.  Anti-Infectives:   IV remdesivir Rocephin Azithro   Subjective:    Christopher Travis relates that his breathing is better than he was yesterday.  Objective:    Vitals:   01/18/20 0030 01/18/20 0038 01/18/20 0139 01/18/20 0400  BP: 114/77 120/78 123/74 113/72  Pulse: (!) 57 69 73 66  Resp: 19 (!) 28 (!) 24 (!) 23  Temp:   (!) 97.5 F (36.4 C) 97.6 F (36.4 C)  TempSrc:    Oral Oral  SpO2: 100% 100% 96% 98%  Weight:    85.8 kg  Height:    5' 11"  (1.803 m)   SpO2: 98 % O2 Flow Rate (L/min): 10 L/min   Intake/Output Summary (Last 24 hours) at 01/18/2020 0706 Last data filed at 01/18/2020 0354 Gross per 24 hour  Intake 1090 ml  Output --  Net 1090 ml   Filed Weights   01/15/2020 2027 01/18/20 0400  Weight: 88.9 kg 85.8 kg    Exam: General exam: In no acute distress. Respiratory system: Good air movement and diffuse crackles bilaterally Cardiovascular system: S1 & S2 heard, RRR. No JVD. Gastrointestinal system: Abdomen is nondistended, soft and nontender.  Central nervous system: Alert and oriented.  Extremities: No pedal edema. Skin: No rashes, lesions or ulcers Psychiatry: Patient has good judgment and insight about his medical condition. Data Reviewed:    Labs: Basic Metabolic Panel: Recent Labs  Lab 02/02/2020 1944 01/18/20 0555  NA 133* 137  K 3.7 4.1  CL 98 105  CO2 19* 21*  GLUCOSE 162* 191*  BUN 30* 32*  CREATININE 1.36* 1.05  CALCIUM 8.8* 8.4*   GFR Estimated Creatinine Clearance: 64.7 mL/min (by C-G formula based on SCr of 1.05 mg/dL). Liver Function Tests: Recent Labs  Lab 01/14/2020 1944 01/18/20 0555  AST 40 32  ALT 6 8  ALKPHOS 175* 147*  BILITOT 2.1* 1.0  PROT 7.5 6.8  ALBUMIN 3.7 3.3*   No results for input(s): LIPASE, AMYLASE in the last 168 hours. No results for input(s): AMMONIA in the last 168 hours. Coagulation profile No results for input(s): INR, PROTIME in the last 168 hours. COVID-19 Labs  Recent Labs    01/19/2020 1944 01/18/20 0555  DDIMER 1.28* 1.08*  FERRITIN 571*  --   LDH 327*  --   CRP 34.6*  --     No results found for: SARSCOV2NAA  CBC: Recent Labs  Lab 01/06/2020 1944 01/18/20 0555  WBC 15.0* 10.2  NEUTROABS 13.2* 9.4*  HGB 15.2 14.3  HCT 43.7 41.1  MCV 93.4 92.4  PLT 198 137*   Cardiac Enzymes: No results for input(s): CKTOTAL, CKMB, CKMBINDEX, TROPONINI in the last 168  hours. BNP (last 3 results) No results for input(s): PROBNP in the last 8760 hours. CBG: No results for input(s): GLUCAP in the last 168 hours. D-Dimer: Recent Labs    01/16/2020 1944 01/18/20 0555  DDIMER 1.28* 1.08*   Hgb A1c: No results for input(s): HGBA1C in the last 72 hours. Lipid Profile: Recent Labs    01/28/2020 1944  TRIG 87   Thyroid function studies: No results for input(s): TSH, T4TOTAL, T3FREE, THYROIDAB in the last 72 hours.  Invalid input(s): FREET3 Anemia work up: Recent Labs    02/02/2020 1944  FERRITIN 571*   Sepsis Labs: Recent Labs  Lab 01/07/2020 1944 01/06/2020 2159 01/18/20 0555  PROCALCITON 2.55  --   --   WBC 15.0*  --  10.2  LATICACIDVEN 4.3* 1.8  --    Microbiology Recent Results (from the past 240 hour(s))  SARS Coronavirus 2 Ag (30 min TAT) - Nasal Swab (BD Veritor Kit)     Status: Abnormal  Collection Time: 01/25/2020  7:59 PM   Specimen: Nasal Swab (BD Veritor Kit)  Result Value Ref Range Status   SARS Coronavirus 2 Ag POSITIVE (A) NEGATIVE Final    Comment: RESULT CALLED TO, READ BACK BY AND VERIFIED WITH: KELLY NEAL RN @2034  01/26/2020 OLSONM (NOTE) SARS-CoV-2 antigen PRESENT. Positive results indicate the presence of viral antigens, but clinical correlation with patient history and other diagnostic information is necessary to determine patient infection status.  Positive results do not rule out bacterial infection or co-infection  with other viruses. False positive results are rare but can occur, and confirmatory RT-PCR testing may be appropriate in some circumstances. The expected result is Negative. Fact Sheet for Patients: PodPark.tn Fact Sheet for Providers: GiftContent.is  This test is not yet approved or cleared by the [place] FDA and  has been authorized for detection and/or diagnosis of SARS-CoV-2 by FDA under an Emergency Use Authorization (EUA).  This EUA  will remain in effect (meaning this test can be used) for the duration of  the COVID-19 decla ration under Section 564(b)(1) of the Act, 21 U.S.C. section 360bbb-3(b)(1), unless the authorization is terminated or revoked sooner. Performed at [facility], [address] [place], [place] [address]      Medications:   . acetaminophen      . carbidopa-levodopa  1 tablet Oral TID  . dexamethasone (DECADRON) injection  6 mg Intravenous Q24H  . enoxaparin (LOVENOX) injection  40 mg Subcutaneous Q24H  . insulin aspart  0-9 Units Subcutaneous TID WC  . rOPINIRole  2 mg Oral QHS  . sodium chloride flush  3 mL Intravenous Q12H  . sodium chloride flush  3 mL Intravenous Q12H   Continuous Infusions: . sodium chloride    . azithromycin 500 mg (01/18/20 0354)  . cefTRIAXone (ROCEPHIN)  IV 2 g (01/18/20 0234)  . remdesivir 100 mg in NS 100 mL        LOS: 0 days   Charlynne Cousins  [facility]  01/18/2020, 7:06 AM

## 2020-01-18 NOTE — Progress Notes (Signed)
Pt sitting on edge of bed this morning when RN came in to meet and evaluate. He needed to use BSC. Assisted pt to [facility] and back to bed and pt unable to bring O2 sat back above 86% with 15L HFNC. RN changed probe site and repositioned pt and coached pt to slowly deep breath and pt still not able to bring sat's above 86%^. RN applied 15L NRB and pt quickly brought sat's up to 93%.

## 2020-01-18 NOTE — Progress Notes (Signed)
PHARMACY - PHYSICIAN COMMUNICATION CRITICAL VALUE ALERT - BLOOD CULTURE IDENTIFICATION (BCID)  Christianjacob Mccredie is an 74 y.o. male who presented to [facility] on 01/08/2020 with a chief complaint of Acute respiratory failure with hypoxia 2/2 Pneumonia due to COVID-19 virus and probably community-acquired pneumonia  Assessment: 1/2 blood cx is positive for GPC, no BCID Tm 102.5, wbc 10.2K, CRP31, PCT 2.55 > 3.7.  Name of physician (or Provider) Contacted: Dr. Aileen Fass (via secure chat)  Current antibiotics: Rocephin and azithromycin were started this morning for CAP   Changes to prescribed antibiotics recommended:  Response not received from provider;  current antibiotics are likely to cover the isolated organism.  Consider de-escalation soon.  No change for now Will continue to follow up results for the other set of blood culture.   No results found for this or any previous visit.  Maryanna Shape, PharmD, BCPS, BCPPS Clinical Pharmacist  Pager: [phone]   01/18/2020  3:21 PM

## 2020-01-18 NOTE — Evaluation (Signed)
Physical Therapy Evaluation Patient Details Name: Christopher Travis MRN: [id number] DOB: Jun 11, 1946 Today's Date: 01/18/2020   History of Present Illness   74 y.o. male with medical history significant for Parkinson disease and non-Hodgkin lymphoma in remission, who developed subjective fevers and malaise roughly a week ago after a family member with asymptomatic COVID visited him. He went on to develop SOB 2-3 days ago, was monitoring oxygen saturations at home, began to see readings in 80's, and presented to [facility] for that reason  Clinical Impression   Pt admitted with above diagnosis. Pt is natural Spanish speaker, therapist was not able to get interpreter Ipad at time of assessment attempted to use phone interpreter but pt declined and stated it was ok to speak to him. As result some of hx given may not be 100% accurate. Pt reports he was living with spouse and that he was independent with ADLs Visually pt has resting and also intention tremors from Parkinson's dx, he may have needed more assist at home that he relates. Pt currently with functional limitations due to the deficits listed below (see PT Problem List). This pm he is very pleasant and very agreeable to assessment, attempted to have pt ambulate in room but each time attempted to stand or more sats would decrease and HR would increase, attempted multi starts with same result, in end opted for standing and marching in place. With this pt di much better and sats initially stayed in 90s but once seated did deat again into mid 80s. Initially probe was on left finger, when moved to R earlobe seemed to have much better readings. Pt was on 15L/min via HFNC and also 15L via NRB, attempted to remove NRB for breathing exercises and immediately pt desat to low 80s, attempted to remove [place] and leave on NRB (even at 25L- to ambulate) with similar results. For all other activities pt was on 15HFNC and 15NRB he still desat to low to mid 80s but with rest and cues for  pursed lip breathing was able to recover to low 90s. Pt will benefit from skilled PT to increase their independence and safety with mobility to allow discharge to the venue listed below.      Follow Up Recommendations Supervision/Assistance - 24 hour;SNF    Equipment Recommendations  Rolling walker with 5" wheels    Recommendations for Other Services OT consult     Precautions / Restrictions Precautions Precautions: Fall;Other (comment)(parkinson's w/ tremors) Restrictions Weight Bearing Restrictions: No      Mobility  Bed Mobility               General bed mobility comments: pt sitting in recliner at therapist arrival  Transfers Overall transfer level: Needs assistance Equipment used: Rolling walker (2 wheeled) Transfers: Sit to/from Stand Sit to Stand: Mod assist            Ambulation/Gait             General Gait Details: did not attempt ambulation at this time just marching x 12 in place  Stairs            Wheelchair Mobility    Modified Rankin (Stroke Patients Only)       Balance Overall balance assessment: Needs assistance Sitting-balance support: Feet supported Sitting balance-Leahy Scale: Fair     Standing balance support: During functional activity;Bilateral upper extremity supported Standing balance-Leahy Scale: Fair  Pertinent Vitals/Pain Pain Assessment: No/denies pain    Home Living Family/patient expects to be discharged to:: Other (Comment)(lives in independent living) Living Arrangements: Spouse/significant other                    Prior Function Level of Independence: Independent         Comments: reports he was independent but not sure of accuracy of this     Hand Dominance        Extremity/Trunk Assessment   Upper Extremity Assessment Upper Extremity Assessment: Generalized weakness    Lower Extremity Assessment Lower Extremity Assessment:  Generalized weakness       Communication   Communication: Other (comment);Prefers language other than English(therapist unable to find visual interpreter pt states ok w/o)  Cognition Arousal/Alertness: Awake/alert Behavior During Therapy: WFL for tasks assessed/performed Overall Cognitive Status: No family/caregiver present to determine baseline cognitive functioning                                 General Comments: Pt declined need for interpreter stating it was ok to speak to him, not sure of accuracy of details given in hx      General Comments      Exercises Other Exercises Other Exercises: reinforced use of flutter valve and IS   Assessment/Plan    PT Assessment Patient needs continued PT services  PT Problem List Decreased strength;Decreased activity tolerance;Decreased balance;Decreased mobility;Decreased coordination;Decreased knowledge of use of DME;Decreased safety awareness       PT Treatment Interventions DME instruction;Gait training;Functional mobility training;Therapeutic activities;Therapeutic exercise;Balance training;Neuromuscular re-education;Patient/family education    PT Goals (Current goals can be found in the Care Plan section)  Acute Rehab PT Goals Patient Stated Goal: would like to get back home to spouse PT Goal Formulation: With patient Time For Goal Achievement: 02/01/20 Potential to Achieve Goals: Fair    Frequency Min 3X/week   Barriers to discharge        Co-evaluation               AM-PAC PT "6 Clicks" Mobility  Outcome Measure Help needed turning from your back to your side while in a flat bed without using bedrails?: A Little Help needed moving from lying on your back to sitting on the side of a flat bed without using bedrails?: A Little Help needed moving to and from a bed to a chair (including a wheelchair)?: A Lot Help needed standing up from a chair using your arms (e.g., wheelchair or bedside chair)?: A  Lot Help needed to walk in hospital room?: A Lot Help needed climbing 3-5 steps with a railing? : A Lot 6 Click Score: 14    End of Session Equipment Utilized During Treatment: Oxygen;Gait belt Activity Tolerance: Patient limited by fatigue;Patient limited by lethargy;Treatment limited secondary to medical complications (Comment) Patient left: in chair;with call bell/phone within reach;with chair alarm set Nurse Communication: Mobility status PT Visit Diagnosis: Unsteadiness on feet (R26.81);Other abnormalities of gait and mobility (R26.89);Muscle weakness (generalized) (M62.81)    Time: [id number] PT Time Calculation (min) (ACUTE ONLY): 44 min   Charges:   PT Evaluation $PT Eval Moderate Complexity: 1 Mod PT Treatments $Therapeutic Activity: 23-37 mins        Horald Chestnut, PT   Delford Field 01/18/2020, 5:20 PM

## 2020-01-18 NOTE — H&P (Addendum)
History and Physical    Christopher Travis CWC:[id number] DOB: [birth year] DOA: 01/09/2020  PCP: System, Pcp Not In   Patient coming from: ILF  Chief Complaint: SOB, cough, malaise, fevers, chills    HPI: Christopher Travis is a 74 y.o. male with medical history significant for Parkinson disease and non-Hodgkin lymphoma in remission, who developed subjective fevers and malaise roughly a week ago after a family member with asymptomatic COVID visited him. He went on to develop SOB 2-3 days ago, was monitoring oxygen saturations at home, began to see readings in 80's, and presented to [facility] for that reason. He denies chest pain, abdominal pain, flank pain, or leg swelling or tenderness. He feels a lot better with supplemental O2.   ED Course: Upon arrival to the ED, patient is found to be febrile to 39.2 C, saturating 79 on rm air, tachypneic with RR 30, slightly tachycardic, and with stable BP. EKG with sinus rhythm and PVC's. CXR concerning for patchy bilateral airspace opacities. Chemistry panel features SCr of 1.36, up from an apparent baseline of 1.0. Total bilirubin is 2.1 and was normal in June 2020. CBC notable for leukocytosis to 15,000 and lactic acid was elevated to 4.3. Procalcitonin elevated to 2.55 and CRP 34.6.  COVID Ag was positive and d-dimer 1.28. Blood cultures were collected in ED, 500 cc NS bolus was given, and he was treated with remdesivir, Decadron, and supplemental O2 prior to transfer to [facility] for admission.   Review of Systems:  All other systems reviewed and apart from HPI, are negative.  Past Medical History:  Diagnosis Date  . Hypertension   . Non Hodgkin's lymphoma ([place])   . Parkinson disease ([place])    Originally from [place], lived in [place] for >20 yrs, then [place] until moving to [place] recently where he lives with his wife in an [place].   History reviewed. No pertinent surgical history.   reports that he has never smoked. He has never used smokeless tobacco. He reports  previous alcohol use. He reports that he does not use drugs.  No Known Allergies  Family History  Problem Relation Age of Onset  . Liver cancer Mother      Prior to Admission medications   Medication Sig Start Date End Date Taking? Authorizing Provider  amLODipine-valsartan (EXFORGE) 10-320 MG tablet Take 1 tablet by mouth daily.   Yes [provider]  carbidopa-levodopa (SINEMET IR) 25-100 MG tablet Take 1 tablet by mouth 3 (three) times daily.   Yes [provider]  rOPINIRole (REQUIP) 2 MG tablet Take 2 mg by mouth at bedtime.   Yes [provider]    Physical Exam: Vitals:   01/18/20 0000 01/18/20 0030 01/18/20 0038 01/18/20 0139  BP: 124/76 114/77 120/78 123/74  Pulse: 64 (!) 57 69 73  Resp: (!) 25 19 (!) 28 (!) 24  Temp:    (!) 97.5 F (36.4 C)  TempSrc:    Oral  SpO2: 100% 100% 100% 96%  Weight:      Height:        Constitutional: NAD, calm  Eyes: PERTLA, lids and conjunctivae normal ENMT: Mucous membranes are moist. Posterior pharynx clear of any exudate or lesions.   Neck: normal, supple, no masses, no thyromegaly Respiratory: mild tachypnea, no wheezing. Speaking full sentences.  Cardiovascular: S1 & S2 heard, regular rate and rhythm. No extremity edema. Abdomen: No distension, no tenderness, soft. Bowel sounds active.  Musculoskeletal: no clubbing / cyanosis. No joint deformity upper and lower extremities.  Skin: no significant rashes, lesions, ulcers. Warm, dry, well-perfused. Neurologic: No facial asymmetry, no dysarthria. Sensation intact. Resting LUE tremor. Moving all extremities.  Psychiatric: Alert and oriented, appropriate throughout interview and exam. Very pleasant and cooperative.    Labs and Imaging on Admission: I have personally reviewed following labs and imaging studies  CBC: Recent Labs  Lab 01/10/2020 1944  WBC 15.0*  NEUTROABS 13.2*  HGB 15.2  HCT 43.7  MCV 93.4  PLT 382   Basic Metabolic Panel: Recent  Labs  Lab 01/16/2020 1944  NA 133*  K 3.7  CL 98  CO2 19*  GLUCOSE 162*  BUN 30*  CREATININE 1.36*  CALCIUM 8.8*   GFR: Estimated Creatinine Clearance: 50 mL/min (A) (by C-G formula based on SCr of 1.36 mg/dL (H)). Liver Function Tests: Recent Labs  Lab 01/06/2020 1944  AST 40  ALT 6  ALKPHOS 175*  BILITOT 2.1*  PROT 7.5  ALBUMIN 3.7   No results for input(s): LIPASE, AMYLASE in the last 168 hours. No results for input(s): AMMONIA in the last 168 hours. Coagulation Profile: No results for input(s): INR, PROTIME in the last 168 hours. Cardiac Enzymes: No results for input(s): CKTOTAL, CKMB, CKMBINDEX, TROPONINI in the last 168 hours. BNP (last 3 results) No results for input(s): PROBNP in the last 8760 hours. HbA1C: No results for input(s): HGBA1C in the last 72 hours. CBG: No results for input(s): GLUCAP in the last 168 hours. Lipid Profile: Recent Labs    02/01/2020 1944  TRIG 87   Thyroid Function Tests: No results for input(s): TSH, T4TOTAL, FREET4, T3FREE, THYROIDAB in the last 72 hours. Anemia Panel: Recent Labs    01/20/2020 1944  FERRITIN 571*   Urine analysis: No results found for: COLORURINE, APPEARANCEUR, LABSPEC, PHURINE, GLUCOSEU, HGBUR, BILIRUBINUR, KETONESUR, PROTEINUR, UROBILINOGEN, NITRITE, LEUKOCYTESUR Sepsis Labs: @LABRCNTIP (procalcitonin:4,lacticidven:4) ) Recent Results (from the past 240 hour(s))  SARS Coronavirus 2 Ag (30 min TAT) - Nasal Swab (BD Veritor Kit)     Status: Abnormal   Collection Time: 01/14/2020  7:59 PM   Specimen: Nasal Swab (BD Veritor Kit)  Result Value Ref Range Status   SARS Coronavirus 2 Ag POSITIVE (A) NEGATIVE Final    Comment: RESULT CALLED TO, READ BACK BY AND VERIFIED WITH: KELLY NEAL RN @2034  01/30/2020 OLSONM (NOTE) SARS-CoV-2 antigen PRESENT. Positive results indicate the presence of viral antigens, but clinical correlation with patient history and other diagnostic information is necessary to determine  patient infection status.  Positive results do not rule out bacterial infection or co-infection  with other viruses. False positive results are rare but can occur, and confirmatory RT-PCR testing may be appropriate in some circumstances. The expected result is Negative. Fact Sheet for Patients: PodPark.tn Fact Sheet for Providers: GiftContent.is  This test is not yet approved or cleared by the [place] FDA and  has been authorized for detection and/or diagnosis of SARS-CoV-2 by FDA under an Emergency Use Authorization (EUA).  This EUA will remain in effect (meaning this test can be used) for the duration of  the COVID-19 decla ration under Section 564(b)(1) of the Act, 21 U.S.C. section 360bbb-3(b)(1), unless the authorization is terminated or revoked sooner. Performed at [facility], [address] [place], [place] [address]      Radiological Exams on Admission: DG Chest Portable 1 View  Result Date: 01/26/2020 CLINICAL DATA:  Shortness of breath. COVID exposure at home. EXAM: PORTABLE CHEST 1 VIEW COMPARISON:  None. FINDINGS: Heterogeneous patchy bilateral airspace  opacities, left greater than right. Heart is normal in size with normal mediastinal contours. No pleural fluid or pneumothorax. No acute osseous abnormalities are seen. IMPRESSION: Heterogeneous patchy bilateral airspace opacities, left greater than right. Findings suspicious for multifocal pneumonia, pattern typical of COVID pneumonia. Electronically Signed   By: Keith Rake M.D.   On: 01/16/2020 20:13    EKG: Independently reviewed. Sinus rhythm, rate 98, PVC's.   Assessment/Plan   1. COVID-19, acute hypoxic respiratory failure   - Presents with a week of fever/chills and malaise, a couple days of progressive SOB, and low O2 sat at home  - He was tachypneic in ED, saturating 79% on rm air, CXR with multifocal PNA, procalcitonin and lactate  elevated  - He was started on HFNC oxygen, remdeisivir, and Decadron in ED  - Continue current medications, add Rocephin and azithromycin, trend markers, continue supportive care    2. Sepsis  - Presents with fever, tachypnea, tachycardia, leukocytosis, mild AKI, and had lactate of 4.3 and procalcitonin of 2.55  - CXR concerning for multifocal PNA, total bili elevated with benign abdominal exam, and there is no flank pain, urinary sxs, meningismus, or any apparent cellulitis  - Blood cultures were collected in ED, will add sputum and urine cultures, check legionella and strep pneumo antigens, start Rocephin and azithromycin, and follow cultures and clinical response to treatment    3. Acute kidney injury  - SCr is 1.36 in ED, up from 1.0 in June  - He was given IVF hydration in ED and chem panel will be repeated in am   4. Hyperbilirubinemia  - Total bilirubin is 2.1 with mild alk phos elevation and normal transaminases  - Abdominal exam benign, will repeat CMP in am   5. Parkinson disease  - Continue Sinemet and ropinrole     DVT prophylaxis: Lovenox Code Status: Full  Family Communication: Discussed with patient  Disposition Plan: Will likely return to ILF once respiratory status improved and stable  Consults called: None  Admission status: Inpatient    Vianne Bulls, MD [facility] Pager: See [email or web address]  If 7AM-7PM, please contact the daytime attending [email or web address]  01/18/2020, 2:36 AM

## 2020-01-18 NOTE — ED Notes (Signed)
Pt's daughter contacted and made aware pt had been transported to [facility].

## 2020-01-18 NOTE — Progress Notes (Signed)
Spoke with daughter [name] over the phone to have informed consent for Blood/Blood Products transfusion form signed. Daughter agrees to have plasma transfused. Consent verified with another RN.

## 2020-01-18 NOTE — Progress Notes (Signed)
Used [employer] interpreter Charlett Nose [phone] to discuss placing the [facility] on pt do be able to check pt blood sugars easier. Pt verbalizes understanding and gives verbal permission to place it.

## 2020-01-18 NOTE — Progress Notes (Signed)
New Admission Note:   Arrival Method: via [facility] from [facility] Mental Orientation: A&O X4 Telemetry: ON, CCMD notified Assessment: Completed Skin: Intact, refer to flowsheet IV: L hand & LFA, saline locked  Pain: 0/10 Tubes: None Safety Measures: Safety Fall Prevention Plan has been discussed  Admission: completed [place] Orientation: Patient has been orientated to the room, unit and staff.   Family: none   Orders to be reviewed and implemented. Will continue to monitor the patient. Call light has been placed within reach and bed alarm has been activated.

## 2020-01-19 LAB — COMPREHENSIVE METABOLIC PANEL
ALT: 10 U/L (ref 0–44)
AST: 45 U/L — ABNORMAL HIGH (ref 15–41)
Albumin: 2.9 g/dL — ABNORMAL LOW (ref 3.5–5.0)
Alkaline Phosphatase: 143 U/L — ABNORMAL HIGH (ref 38–126)
Anion gap: 13 (ref 5–15)
BUN: 35 mg/dL — ABNORMAL HIGH (ref 8–23)
CO2: 20 mmol/L — ABNORMAL LOW (ref 22–32)
Calcium: 8.5 mg/dL — ABNORMAL LOW (ref 8.9–10.3)
Chloride: 106 mmol/L (ref 98–111)
Creatinine, Ser: 1.12 mg/dL (ref 0.61–1.24)
GFR calc Af Amer: >60 mL/min (ref >60)
GFR calc non Af Amer: >60 mL/min (ref >60)
Glucose, Bld: 207 mg/dL — ABNORMAL HIGH (ref 70–99)
Potassium: 3.6 mmol/L (ref 3.5–5.1)
Sodium: 139 mmol/L (ref 135–145)
Total Bilirubin: 1.2 mg/dL (ref 0.3–1.2)
Total Protein: 6.5 g/dL (ref 6.5–8.1)

## 2020-01-19 LAB — CBC WITH DIFFERENTIAL/PLATELET
Abs Immature Granulocytes: 0.14 10*3/uL — ABNORMAL HIGH (ref 0.00–0.07)
Basophils Absolute: 0 10*3/uL (ref 0.0–0.1)
Basophils Relative: 0 %
Eosinophils Absolute: 0 10*3/uL (ref 0.0–0.5)
Eosinophils Relative: 0 %
HCT: 40.1 % (ref 39.0–52.0)
Hemoglobin: 13.6 g/dL (ref 13.0–17.0)
Immature Granulocytes: 1 %
Lymphocytes Relative: 2 %
Lymphs Abs: 0.3 10*3/uL — ABNORMAL LOW (ref 0.7–4.0)
MCH: 31.5 pg (ref 26.0–34.0)
MCHC: 33.9 g/dL (ref 30.0–36.0)
MCV: 92.8 fL (ref 80.0–100.0)
Monocytes Absolute: 0.8 10*3/uL (ref 0.1–1.0)
Monocytes Relative: 4 %
Neutro Abs: 17 10*3/uL — ABNORMAL HIGH (ref 1.7–7.7)
Neutrophils Relative %: 93 %
Platelets: 172 10*3/uL (ref 150–400)
RBC: 4.32 MIL/uL (ref 4.22–5.81)
RDW: 13.7 % (ref 11.5–15.5)
WBC: 18.3 10*3/uL — ABNORMAL HIGH (ref 4.0–10.5)
nRBC: 0 % (ref 0.0–0.2)

## 2020-01-19 LAB — CULTURE, BLOOD (ROUTINE X 2): Special Requests: ADEQUATE

## 2020-01-19 LAB — GLUCOSE, CAPILLARY
Glucose-Capillary: 165 mg/dL — ABNORMAL HIGH (ref 70–99)
Glucose-Capillary: 147 mg/dL — ABNORMAL HIGH (ref 70–99)
Glucose-Capillary: 121 mg/dL — ABNORMAL HIGH (ref 70–99)

## 2020-01-19 LAB — C-REACTIVE PROTEIN: CRP: 28.6 mg/dL — ABNORMAL HIGH (ref <1.0)

## 2020-01-19 LAB — D-DIMER, QUANTITATIVE: D-Dimer, Quant: 1.67 ug/mL-FEU — ABNORMAL HIGH (ref 0.00–0.50)

## 2020-01-19 LAB — FERRITIN: Ferritin: 924 ng/mL — ABNORMAL HIGH (ref 24–336)

## 2020-01-19 LAB — PROCALCITONIN: Procalcitonin: 3.21 ng/mL

## 2020-01-19 LAB — MAGNESIUM: Magnesium: 1.8 mg/dL (ref 1.7–2.4)

## 2020-01-19 MED ORDER — METOPROLOL TARTRATE 25 MG PO TABS
25.0000 mg | ORAL_TABLET | Freq: Two times a day (BID) | ORAL | Status: DC
  Administered 2020-01-19 – 2020-01-20 (×4): 25 mg via ORAL
  Filled 2020-01-19 (×4): qty 1

## 2020-01-19 NOTE — Progress Notes (Signed)
Called pt daughter [name] to give update, she was very h appy and thankful for update.

## 2020-01-19 NOTE — Progress Notes (Signed)
RN updated pt's daughter, [name], on [place].  RN will continue to monitor/assess pt.

## 2020-01-19 NOTE — Progress Notes (Signed)
Pt updated wife on personal cell phone.  RN will continue to monitor/assess pt

## 2020-01-19 NOTE — Progress Notes (Signed)
[facility] PROGRESS NOTE    Progress Note  Christopher Travis  ZDG:[id number] DOB: 1946/06/01 DOA: 01/12/2020 PCP: System, Pcp Not In     Brief Narrative:   Christopher Travis is an 74 y.o. male past medical history significant for Parkinson's disease and non-Hodgkin's lymphoma in remission who developed subjective fever malaise about 1 week prior to admission, started developing shortness of breath about 2 to 3 days ago at home his sats began reading 80% presented to [place] found to be hypoxic,  Tachypneic chest x-ray showed bilateral infiltrates, SARS-CoV-2 PCR, his creatinine was 1.3 (up from baseline 1.0) elevated bili Ruben with a leukocytosis lactic acid of 4.3 procalcitonin 2.5 D-dimer 1.28 blood cultures ordered was fluid resuscitated started on IV Decadron on remdesivir.  Assessment/Plan:   Acute respiratory failure with hypoxia 2/2 Pneumonia due to COVID-19 virus and probably community-acquired pneumonia: Mr. Amborn continue to require 10 L of high flow nasal cannula oxygen to keep saturations greater than 93%. He is status post 2 units of convalescent plasma, he was started on IV remdesivir and Decadron. He was started empirically on IV Rocephin and azithromycin due to the high suspicion of bacterial pneumonia. His white blood cell count is trending up, he has remained afebrile since admission is are improving, but what is comforting is that his oxygen requirements have come down from 15 L to 10 L. Try to keep the patient prone for early 16 hours a day, would not prone out of bed to chair, continue to use incentive spirometry and flutter valve, continue strict I's and O's and daily weights.  Sepsis possibly due to community-acquired pneumonia: He had acute renal failure leukocytosis and tachycardia, with an elevated procalcitonin lactate on admission. Cultures have remained negative till date, urine antigens are negative he has defervesced, continue empiric Rocephin and  azithromycin.  Acute kidney injury: With a baseline creatinine of around 1, on admission 1.3 likely prerenal, resolved with IV fluid hydration.  Hyperbilirubinemia Abdominal exam is benign, now resolved.  Parkinson's disease continue current medication.  Essential hypertension: Holding antihypertensive medication due to the sepsis picture.   DVT prophylaxis: Lovenox Family Communication:daughter Disposition Plan/Barrier to D/C: Once he is off oxygen has completed his treatment.  Code Status:     Code Status Orders  (From admission, onward)         Start     Ordered   01/18/20 0147  Full code  Continuous     01/18/20 0149        Code Status History    This patient has a current code status but no historical code status.   Advance Care Planning Activity        IV Access:    Peripheral IV   Procedures and diagnostic studies:   DG Chest Portable 1 View  Result Date: 01/23/2020 CLINICAL DATA:  Shortness of breath. COVID exposure at home. EXAM: PORTABLE CHEST 1 VIEW COMPARISON:  None. FINDINGS: Heterogeneous patchy bilateral airspace opacities, left greater than right. Heart is normal in size with normal mediastinal contours. No pleural fluid or pneumothorax. No acute osseous abnormalities are seen. IMPRESSION: Heterogeneous patchy bilateral airspace opacities, left greater than right. Findings suspicious for multifocal pneumonia, pattern typical of COVID pneumonia. Electronically Signed   By: Keith Rake M.D.   On: 01/18/2020 20:13     Medical Consultants:    None.  Anti-Infectives:   IV remdesivir Rocephin Azithro   Subjective:    Buell Parcel relates his breathing is significantly  better than yesterday.  Objective:    Vitals:   01/19/20 0626 01/19/20 0651 01/19/20 0724 01/19/20 0747  BP: 100/71 (!) 131/92 106/63 103/65  Pulse: 70 68 69 70  Resp: (!) 23 (!) 28 20 (!) 21  Temp: 97.7 F (36.5 C) 97.6 F (36.4 C) (!) 97.5 F (36.4 C) 97.6  F (36.4 C)  TempSrc: Oral Oral Oral Oral  SpO2: 93% 97% (!) 89% 93%  Weight:      Height:       SpO2: 93 % O2 Flow Rate (L/min): 10 L/min   Intake/Output Summary (Last 24 hours) at 01/19/2020 0815 Last data filed at 01/19/2020 0651 Gross per 24 hour  Intake 2227.91 ml  Output 150 ml  Net 2077.91 ml   Filed Weights   01/15/2020 2027 01/18/20 0400 01/19/20 0419  Weight: 88.9 kg 85.8 kg 85.9 kg    Exam: General exam: In no acute distress. Respiratory system: Good air movement and diffuse crackles bilaterally Cardiovascular system: S1 & S2 heard, RRR. No JVD. Gastrointestinal system: Abdomen is nondistended, soft and nontender.  Central nervous system: Alert and oriented.  Tremor at rest Extremities: No pedal edema. Skin: No rashes, lesions or ulcers  Data Reviewed:    Labs: Basic Metabolic Panel: Recent Labs  Lab 01/09/2020 1944 01/07/2020 1944 01/18/20 0555 01/19/20 0225  NA 133*  --  137 139  K 3.7   < > 4.1 3.6  CL 98  --  105 106  CO2 19*  --  21* 20*  GLUCOSE 162*  --  191* 207*  BUN 30*  --  32* 35*  CREATININE 1.36*  --  1.05 1.12  CALCIUM 8.8*  --  8.4* 8.5*  MG  --   --   --  1.8   < > = values in this interval not displayed.   GFR Estimated Creatinine Clearance: 60.7 mL/min (by C-G formula based on SCr of 1.12 mg/dL). Liver Function Tests: Recent Labs  Lab 01/24/2020 1944 01/18/20 0555 01/19/20 0225  AST 40 32 45*  ALT 6 8 10   ALKPHOS 175* 147* 143*  BILITOT 2.1* 1.0 1.2  PROT 7.5 6.8 6.5  ALBUMIN 3.7 3.3* 2.9*   No results for input(s): LIPASE, AMYLASE in the last 168 hours. No results for input(s): AMMONIA in the last 168 hours. Coagulation profile No results for input(s): INR, PROTIME in the last 168 hours. COVID-19 Labs  Recent Labs    01/30/2020 1944 01/18/20 0555 01/19/20 0225  DDIMER 1.28* 1.08* 1.67*  FERRITIN 571* 604* 924*  LDH 327*  --   --   CRP 34.6* 31.0* 28.6*    No results found for: SARSCOV2NAA  CBC: Recent Labs    Lab 01/16/2020 1944 01/18/20 0555 01/19/20 0225  WBC 15.0* 10.2 18.3*  NEUTROABS 13.2* 9.4* 17.0*  HGB 15.2 14.3 13.6  HCT 43.7 41.1 40.1  MCV 93.4 92.4 92.8  PLT 198 137* 172   Cardiac Enzymes: No results for input(s): CKTOTAL, CKMB, CKMBINDEX, TROPONINI in the last 168 hours. BNP (last 3 results) No results for input(s): PROBNP in the last 8760 hours. CBG: Recent Labs  Lab 01/18/20 0215 01/18/20 0804 01/18/20 1151 01/18/20 1718  GLUCAP 182* 155* 221* 120*   D-Dimer: Recent Labs    01/18/20 0555 01/19/20 0225  DDIMER 1.08* 1.67*   Hgb A1c: No results for input(s): HGBA1C in the last 72 hours. Lipid Profile: Recent Labs    01/27/2020 1944  TRIG 87   Thyroid function studies: No  results for input(s): TSH, T4TOTAL, T3FREE, THYROIDAB in the last 72 hours.  Invalid input(s): FREET3 Anemia work up: Recent Labs    01/18/20 0555 01/19/20 0225  FERRITIN 604* 924*   Sepsis Labs: Recent Labs  Lab 01/21/2020 1944 01/24/2020 2159 01/18/20 0555 01/19/20 0225  PROCALCITON 2.55  --  3.70 3.21  WBC 15.0*  --  10.2 18.3*  LATICACIDVEN 4.3* 1.8  --   --    Microbiology Recent Results (from the past 240 hour(s))  Blood culture (routine x 2)     Status: None (Preliminary result)   Collection Time: 01/30/2020  7:25 PM   Specimen: BLOOD  Result Value Ref Range Status   Specimen Description   Final    BLOOD BLOOD LEFT FOREARM Performed at [facility], [address] [place], [place] [address]    Special Requests   Final    BOTTLES DRAWN AEROBIC AND ANAEROBIC Blood Culture adequate volume Performed at [facility] [place] [facility], [address] [place], [place] [address]    Culture  Setup Time   Final    GRAM POSITIVE COCCI AEROBIC BOTTLE ONLY CRITICAL RESULT CALLED TO, READ BACK BY AND VERIFIED WITH: PHARMD MAY BALL @1501  01/18/20 AKT Performed at [facility], [place] [address] [place], [place] [address]    Culture GRAM POSITIVE COCCI  Final   Report  Status PENDING  Incomplete  Blood culture (routine x 2)     Status: None (Preliminary result)   Collection Time: 01/10/2020  7:54 PM   Specimen: BLOOD  Result Value Ref Range Status   Specimen Description   Final    BLOOD RIGHT ANTECUBITAL Performed at [facility], [address] [place], [place] [address]    Special Requests   Final    BOTTLES DRAWN AEROBIC AND ANAEROBIC Blood Culture adequate volume Performed at [facility], [address] [place], [place] [address]    Culture   Final    NO GROWTH < 24 HOURS Performed at [facility], [place] [address] [place], [place] [address]    Report Status PENDING  Incomplete  SARS Coronavirus 2 Ag (30 min TAT) - Nasal Swab (BD Veritor Kit)     Status: Abnormal   Collection Time: 01/30/2020  7:59 PM   Specimen: Nasal Swab (BD Veritor Kit)  Result Value Ref Range Status   SARS Coronavirus 2 Ag POSITIVE (A) NEGATIVE Final    Comment: RESULT CALLED TO, READ BACK BY AND VERIFIED WITH: KELLY NEAL RN @2034  01/12/2020 OLSONM (NOTE) SARS-CoV-2 antigen PRESENT. Positive results indicate the presence of viral antigens, but clinical correlation with patient history and other diagnostic information is necessary to determine patient infection status.  Positive results do not rule out bacterial infection or co-infection  with other viruses. False positive results are rare but can occur, and confirmatory RT-PCR testing may be appropriate in some circumstances. The expected result is Negative. Fact Sheet for Patients: PodPark.tn Fact Sheet for Providers: GiftContent.is  This test is not yet approved or cleared by the [place] FDA and  has been authorized for detection and/or diagnosis of SARS-CoV-2 by FDA under an Emergency Use Authorization (EUA).  This EUA will remain in effect (meaning this test can be used) for the duration of  the COVID-19 decla ration under  Section 564(b)(1) of the Act, 21 U.S.C. section 360bbb-3(b)(1), unless the authorization is terminated or revoked sooner. Performed at [facility],  [place], [place] [address]      Medications:   . carbidopa-levodopa  2 tablet Oral QHS  . carbidopa-levodopa  2 tablet Oral 3 times per day   And  . carbidopa-levodopa  1 tablet Oral QHS  . dexamethasone (DECADRON) injection  6 mg Intravenous Q24H  . enoxaparin (LOVENOX) injection  40 mg Subcutaneous Q24H  . feeding supplement (ENSURE ENLIVE)  237 mL Oral TID BM  . insulin aspart  0-9 Units Subcutaneous TID WC  . rOPINIRole  3 mg Oral Q24H  . sodium chloride flush  3 mL Intravenous Q12H  . sodium chloride flush  3 mL Intravenous Q12H   Continuous Infusions: . sodium chloride    . azithromycin 500 mg (01/19/20 0414)  . cefTRIAXone (ROCEPHIN)  IV 2 g (01/19/20 6010)  . remdesivir 100 mg in NS 100 mL Stopped (01/18/20 1020)      LOS: 1 day   Charlynne Cousins  [facility]  01/19/2020, 8:15 AM

## 2020-01-19 NOTE — Progress Notes (Signed)
Pt proned this morning for approx 1.5 hours and kept O2 sats at 100% while in prone position. RN able to titrate HFNC down to 6L while pt proned. Pt said he wanted to change positions and hasn't been able to keep O2 sats stable above 88% since then. RN has attempted to coach pt with breathing techniques as well as added 15L NRB. Pt O2 sats still not maintaining over 88% and pt RR is staying in high 20's to low 30's. Pt also have sporadic periods of tachycardia that last for 30 sec to a min.  RN contacted MD and adv of situation. He gave order to start HHF. RN called CN and adv of order and also called RT to come evaluate pt and set up for HHF.

## 2020-01-20 LAB — BPAM FFP
Blood Product Expiration Date: 202102141030
Blood Product Expiration Date: 202102141030
ISSUE DATE / TIME: 202102140235
ISSUE DATE / TIME: 202102140235
Unit Type and Rh: 6200
Unit Type and Rh: 6200

## 2020-01-20 LAB — MRSA PCR SCREENING: MRSA by PCR: NEGATIVE

## 2020-01-20 LAB — COMPREHENSIVE METABOLIC PANEL
ALT: 9 U/L (ref 0–44)
AST: 57 U/L — ABNORMAL HIGH (ref 15–41)
Albumin: 3.3 g/dL — ABNORMAL LOW (ref 3.5–5.0)
Alkaline Phosphatase: 147 U/L — ABNORMAL HIGH (ref 38–126)
Anion gap: 13 (ref 5–15)
BUN: 38 mg/dL — ABNORMAL HIGH (ref 8–23)
CO2: 21 mmol/L — ABNORMAL LOW (ref 22–32)
Calcium: 8.8 mg/dL — ABNORMAL LOW (ref 8.9–10.3)
Chloride: 109 mmol/L (ref 98–111)
Creatinine, Ser: 1 mg/dL (ref 0.61–1.24)
GFR calc Af Amer: >60 mL/min (ref >60)
GFR calc non Af Amer: >60 mL/min (ref >60)
Glucose, Bld: 190 mg/dL — ABNORMAL HIGH (ref 70–99)
Potassium: 3.9 mmol/L (ref 3.5–5.1)
Sodium: 143 mmol/L (ref 135–145)
Total Bilirubin: 0.7 mg/dL (ref 0.3–1.2)
Total Protein: 6.7 g/dL (ref 6.5–8.1)

## 2020-01-20 LAB — POCT I-STAT 7, (LYTES, BLD GAS, ICA,H+H)
Acid-base deficit: 2 mmol/L (ref 0.0–2.0)
Bicarbonate: 20.7 mmol/L (ref 20.0–28.0)
Calcium, Ion: 1.17 mmol/L (ref 1.15–1.40)
HCT: 44 % (ref 39.0–52.0)
Hemoglobin: 15 g/dL (ref 13.0–17.0)
O2 Saturation: 76 %
Potassium: 3.8 mmol/L (ref 3.5–5.1)
Sodium: 144 mmol/L (ref 135–145)
TCO2: 22 mmol/L (ref 22–32)
pCO2 arterial: 28.4 mmHg — ABNORMAL LOW (ref 32.0–48.0)
pH, Arterial: 7.47 — ABNORMAL HIGH (ref 7.350–7.450)
pO2, Arterial: 37 mmHg — CL (ref 83.0–108.0)

## 2020-01-20 LAB — PREPARE FRESH FROZEN PLASMA
Unit division: 0
Unit division: 0

## 2020-01-20 LAB — C-REACTIVE PROTEIN: CRP: 28.9 mg/dL — ABNORMAL HIGH (ref <1.0)

## 2020-01-20 LAB — CBC WITH DIFFERENTIAL/PLATELET
Abs Immature Granulocytes: 0.25 10*3/uL — ABNORMAL HIGH (ref 0.00–0.07)
Basophils Absolute: 0.1 10*3/uL (ref 0.0–0.1)
Basophils Relative: 0 %
Eosinophils Absolute: 0 10*3/uL (ref 0.0–0.5)
Eosinophils Relative: 0 %
HCT: 42.3 % (ref 39.0–52.0)
Hemoglobin: 14.6 g/dL (ref 13.0–17.0)
Immature Granulocytes: 1 %
Lymphocytes Relative: 2 %
Lymphs Abs: 0.4 10*3/uL — ABNORMAL LOW (ref 0.7–4.0)
MCH: 31.9 pg (ref 26.0–34.0)
MCHC: 34.5 g/dL (ref 30.0–36.0)
MCV: 92.6 fL (ref 80.0–100.0)
Monocytes Absolute: 0.6 10*3/uL (ref 0.1–1.0)
Monocytes Relative: 3 %
Neutro Abs: 20.2 10*3/uL — ABNORMAL HIGH (ref 1.7–7.7)
Neutrophils Relative %: 94 %
Platelets: 203 10*3/uL (ref 150–400)
RBC: 4.57 MIL/uL (ref 4.22–5.81)
RDW: 14 % (ref 11.5–15.5)
WBC: 21.4 10*3/uL — ABNORMAL HIGH (ref 4.0–10.5)
nRBC: 0 % (ref 0.0–0.2)

## 2020-01-20 LAB — PROCALCITONIN: Procalcitonin: 2.24 ng/mL

## 2020-01-20 LAB — GLUCOSE, CAPILLARY
Glucose-Capillary: 144 mg/dL — ABNORMAL HIGH (ref 70–99)
Glucose-Capillary: 155 mg/dL — ABNORMAL HIGH (ref 70–99)
Glucose-Capillary: 148 mg/dL — ABNORMAL HIGH (ref 70–99)
Glucose-Capillary: 275 mg/dL — ABNORMAL HIGH (ref 70–99)
Glucose-Capillary: 161 mg/dL — ABNORMAL HIGH (ref 70–99)

## 2020-01-20 LAB — FERRITIN: Ferritin: 1478 ng/mL — ABNORMAL HIGH (ref 24–336)

## 2020-01-20 LAB — D-DIMER, QUANTITATIVE: D-Dimer, Quant: 4.67 ug/mL-FEU — ABNORMAL HIGH (ref 0.00–0.50)

## 2020-01-20 MED ORDER — TOCILIZUMAB 400 MG/20ML IV SOLN: 8.0000 mg/kg | Freq: Once | INTRAVENOUS | Status: DC

## 2020-01-20 MED ORDER — FUROSEMIDE 10 MG/ML IJ SOLN
40.0000 mg | Freq: Once | INTRAMUSCULAR | Status: AC
  Administered 2020-01-20: 40 mg via INTRAVENOUS

## 2020-01-20 MED ORDER — FUROSEMIDE 10 MG/ML IJ SOLN
40.0000 mg | Freq: Once | INTRAMUSCULAR | Status: DC
  Filled 2020-01-20: qty 4

## 2020-01-20 MED ORDER — FUROSEMIDE 10 MG/ML IJ SOLN: 40.0000 mg | Freq: Once | INTRAMUSCULAR | Status: DC

## 2020-01-20 MED ORDER — TOCILIZUMAB 400 MG/20ML IV SOLN
700.0000 mg | Freq: Once | INTRAVENOUS | Status: AC
  Administered 2020-01-20: 700 mg via INTRAVENOUS
  Filled 2020-01-20: qty 35

## 2020-01-20 MED ORDER — CHLORHEXIDINE GLUCONATE CLOTH 2 % EX PADS
6.0000 | MEDICATED_PAD | Freq: Every day | CUTANEOUS | Status: DC
  Administered 2020-01-20 – 2020-01-29 (×9): 6 via TOPICAL

## 2020-01-20 MED ORDER — CHLORHEXIDINE GLUCONATE 0.12 % MT SOLN
15.0000 mL | Freq: Two times a day (BID) | OROMUCOSAL | Status: DC
  Administered 2020-01-20 – 2020-01-29 (×17): 15 mL via OROMUCOSAL
  Filled 2020-01-20 (×12): qty 15

## 2020-01-20 MED ORDER — FUROSEMIDE 10 MG/ML IJ SOLN
40.0000 mg | Freq: Once | INTRAMUSCULAR | Status: AC
  Administered 2020-01-20: 40 mg via INTRAVENOUS
  Filled 2020-01-20: qty 4

## 2020-01-20 MED ORDER — ORAL CARE MOUTH RINSE
15.0000 mL | Freq: Two times a day (BID) | OROMUCOSAL | Status: DC
  Administered 2020-01-20 – 2020-01-25 (×11): 15 mL via OROMUCOSAL

## 2020-01-20 NOTE — Progress Notes (Addendum)
[facility] PROGRESS NOTE    Progress Note  Christopher Travis  EPP:[id number] DOB: 11-29-1946 DOA: 01/23/2020 PCP: System, Pcp Not In     Brief Narrative:   Christopher Travis is an 74 y.o. male past medical history significant for Parkinson's disease and non-Hodgkin's lymphoma in remission who developed subjective fever malaise about 1 week prior to admission, started developing shortness of breath about 2 to 3 days ago at home his sats began reading 80% presented to [place] found to be hypoxic,  Tachypneic chest x-ray showed bilateral infiltrates, SARS-CoV-2 PCR, his creatinine was 1.3 (up from baseline 1.0) elevated bili Ruben with a leukocytosis lactic acid of 4.3 procalcitonin 2.5 D-dimer 1.28 blood cultures ordered was fluid resuscitated started on IV Decadron on remdesivir.  Assessment/Plan:   Acute respiratory failure with hypoxia 2/2 Pneumonia due to COVID-19 virus and probably community-acquired pneumonia: Mr. Pudlo is now requiring heated high flow at an FiO2 of 100% with a flow rate of 40 to keep saturations greater than 89%. His respiration appears that he has calmed down at his respiration rate has slowed down. He is status post 2 units of convalescent plasma, continue IV remdesivir and steroids, due to the high suspicion of bacterial infection he was started empirically on IV Rocephin and azithromycin. His CRP is significantly elevated this morning, he has remained afebrile Try to keep the patient prone for early 16 hours a day, would not prone out of bed to chair, continue to use incentive spirometry and flutter valve, continue strict I's and O's and daily weights. Procalcitonin is trending down, after having a long discussion about the risk and benefits of Actemra the patient and the family would like to proceed with this medication.The treatment plan and use of medications (IV Actemra) and known side effects were discussed with patient/family, they were clearly explained  that there is no proven definitive treatment for COVID-19 infection, any medications used here are based on published clinical articles/anecdotal data which are not peer-reviewed or randomized control trials.  Complete risks and long-term side effects are unknown, however in the best clinical judgment they seem to be of some clinical benefit rather than medical risks.  Patient/family agree with the treatment plan and want to receive the given medications.  Sepsis possibly due to community-acquired pneumonia: He had acute renal failure leukocytosis and tachycardia, with an elevated procalcitonin lactate on admission. Cultures have remained negative till date, urine antigens are negative he has defervesced, continue empiric Rocephin and azithromycin.  Acute kidney injury: With a baseline creatinine of around 1, on admission 1.3 likely prerenal, resolved with IV fluid hydration.  Hyperbilirubinemia Abdominal exam is benign, now resolved.  Parkinson's disease continue current medication.  Essential hypertension: Holding antihypertensive medication due to the sepsis picture.   DVT prophylaxis: Lovenox Family Communication:daughter Disposition Plan/Barrier to D/C: Once he is off oxygen has completed his treatment.  Code Status:     Code Status Orders  (From admission, onward)         Start     Ordered   01/18/20 0147  Full code  Continuous     01/18/20 0149        Code Status History    This patient has a current code status but no historical code status.   Advance Care Planning Activity        IV Access:    Peripheral IV   Procedures and diagnostic studies:   No results found.   Medical Consultants:  None.  Anti-Infectives:   IV remdesivir Rocephin Azithro   Subjective:    Asa Saunas r relates he does not feel well this morning is breathing is worse than yesterday.  Objective:    Vitals:   01/20/20 0530 01/20/20 0535 01/20/20 0555 01/20/20 0700   BP:    (!) 103/58  Pulse:    84  Resp:    (!) 34  Temp:      TempSrc:      SpO2: (!) 84% (!) 85% (!) 80% 93%  Weight:      Height:       SpO2: 93 % O2 Flow Rate (L/min): 40 L/min FiO2 (%): 100 %   Intake/Output Summary (Last 24 hours) at 01/20/2020 0743 Last data filed at 01/19/2020 2200 Gross per 24 hour  Intake 1239.08 ml  Output 100 ml  Net 1139.08 ml   Filed Weights   01/22/2020 2027 01/18/20 0400 01/19/20 0419  Weight: 88.9 kg 85.8 kg 85.9 kg    Exam: General exam: In no acute distress. Respiratory system: Good air movement and diffuse crackles bilaterally. Cardiovascular system: S1 & S2 heard, RRR. No JVD. Gastrointestinal system: Abdomen is nondistended, soft and nontender.  Extremities: No pedal edema. Skin: No rashes, lesions or ulcers   Data Reviewed:    Labs: Basic Metabolic Panel: Recent Labs  Lab 01/15/2020 1944 01/09/2020 1944 01/18/20 0555 01/18/20 0555 01/19/20 0225 01/19/20 0225 01/20/20 0134 01/20/20 0535  NA 133*  --  137  --  139  --  143 144  K 3.7   < > 4.1   < > 3.6   < > 3.9 3.8  CL 98  --  105  --  106  --  109  --   CO2 19*  --  21*  --  20*  --  21*  --   GLUCOSE 162*  --  191*  --  207*  --  190*  --   BUN 30*  --  32*  --  35*  --  38*  --   CREATININE 1.36*  --  1.05  --  1.12  --  1.00  --   CALCIUM 8.8*  --  8.4*  --  8.5*  --  8.8*  --   MG  --   --   --   --  1.8  --   --   --    < > = values in this interval not displayed.   GFR Estimated Creatinine Clearance: 68 mL/min (by C-G formula based on SCr of 1 mg/dL). Liver Function Tests: Recent Labs  Lab 01/07/2020 1944 01/18/20 0555 01/19/20 0225 01/20/20 0134  AST 40 32 45* 57*  ALT _0 ALKPHOS 175* 147* 143* 147*  BILITOT 2.1* 1.0 1.2 0.7  PROT 7.5 6.8 6.5 6.7  ALBUMIN 3.7 3.3* 2.9* 3.3*   No results for input(s): LIPASE, AMYLASE in the last 168 hours. No results for input(s): AMMONIA in the last 168 hours. Coagulation profile No results for input(s): INR,  PROTIME in the last 168 hours. COVID-19 Labs  Recent Labs    01/18/2020 1944 01/11/2020 1944 01/18/20 0555 01/19/20 0225 01/20/20 0134  DDIMER 1.28*   < > 1.08* 1.67* 4.67*  FERRITIN 571*   < > 604* 924* 1,478*  LDH 327*  --   --   --   --   CRP 34.6*   < > 31.0* 28.6* 28.9*   < > = values in  this interval not displayed.    No results found for: SARSCOV2NAA  CBC: Recent Labs  Lab 01/26/2020 1944 01/18/20 0555 01/19/20 0225 01/20/20 0134 01/20/20 0535  WBC 15.0* 10.2 18.3* 21.4*  --   NEUTROABS 13.2* 9.4* 17.0* 20.2*  --   HGB 15.2 14.3 13.6 14.6 15.0  HCT 43.7 41.1 40.1 42.3 44.0  MCV 93.4 92.4 92.8 92.6  --   PLT 198 137* 172 203  --    Cardiac Enzymes: No results for input(s): CKTOTAL, CKMB, CKMBINDEX, TROPONINI in the last 168 hours. BNP (last 3 results) No results for input(s): PROBNP in the last 8760 hours. CBG: Recent Labs  Lab 01/18/20 1718 01/19/20 0722 01/19/20 1157 01/19/20 1650 01/20/20 0719  GLUCAP 120* 165* 147* 121* 155*   D-Dimer: Recent Labs    01/19/20 0225 01/20/20 0134  DDIMER 1.67* 4.67*   Hgb A1c: No results for input(s): HGBA1C in the last 72 hours. Lipid Profile: Recent Labs    01/19/2020 1944  TRIG 87   Thyroid function studies: No results for input(s): TSH, T4TOTAL, T3FREE, THYROIDAB in the last 72 hours.  Invalid input(s): FREET3 Anemia work up: Recent Labs    01/19/20 0225 01/20/20 0134  FERRITIN 924* 1,478*   Sepsis Labs: Recent Labs  Lab 01/20/2020 1944 01/06/2020 2159 01/18/20 0555 01/19/20 0225 01/20/20 0134  PROCALCITON 2.55  --  3.70 3.21  --   WBC 15.0*  --  10.2 18.3* 21.4*  LATICACIDVEN 4.3* 1.8  --   --   --    Microbiology Recent Results (from the past 240 hour(s))  Blood culture (routine x 2)     Status: Abnormal   Collection Time: 01/23/2020  7:25 PM   Specimen: BLOOD  Result Value Ref Range Status   Specimen Description   Final    BLOOD BLOOD LEFT FOREARM Performed at [facility], [address] [place], [place] [address]    Special Requests   Final    BOTTLES DRAWN AEROBIC AND ANAEROBIC Blood Culture adequate volume Performed at [facility], [address] [place], [place] [address]    Culture  Setup Time   Final    GRAM POSITIVE COCCI AEROBIC BOTTLE ONLY CRITICAL RESULT CALLED TO, READ BACK BY AND VERIFIED WITH: PHARMD MAY BALL _0  01/18/20 AKT    Culture (A)  Final    STAPHYLOCOCCUS SPECIES (COAGULASE NEGATIVE) THE SIGNIFICANCE OF ISOLATING THIS ORGANISM FROM A SINGLE SET OF BLOOD CULTURES WHEN MULTIPLE SETS ARE DRAWN IS UNCERTAIN. PLEASE NOTIFY THE MICROBIOLOGY DEPARTMENT WITHIN ONE WEEK IF SPECIATION AND SENSITIVITIES ARE REQUIRED. Performed at [facility], [place] [address] [place], [place] [address]    Report Status 01/19/2020 FINAL  Final  Blood culture (routine x 2)     Status: None (Preliminary result)   Collection Time: 01/16/2020  7:54 PM   Specimen: BLOOD  Result Value Ref Range Status   Specimen Description   Final    BLOOD RIGHT ANTECUBITAL Performed at [facility], [address] [place], [place] [address]    Special Requests   Final    BOTTLES DRAWN AEROBIC AND ANAEROBIC Blood Culture adequate volume Performed at [facility], [address] [place], [place] [address]    Culture   Final    NO GROWTH 3 DAYS Performed at [facility], [place] [address], [place], [place] [address]    Report Status PENDING  Incomplete  SARS  Coronavirus 2 Ag (30 min TAT) - Nasal Swab (BD Veritor Kit)     Status: Abnormal   Collection Time: 01/15/2020  7:59 PM   Specimen: Nasal Swab (BD Veritor Kit)  Result Value Ref Range Status   SARS Coronavirus 2 Ag POSITIVE (A) NEGATIVE Final    Comment: RESULT CALLED TO, READ BACK BY AND VERIFIED WITH: KELLY NEAL RN _0  01/14/2020 OLSONM (NOTE) SARS-CoV-2 antigen PRESENT. Positive results indicate the presence of viral antigens, but clinical correlation with patient history  and other diagnostic information is necessary to determine patient infection status.  Positive results do not rule out bacterial infection or co-infection  with other viruses. False positive results are rare but can occur, and confirmatory RT-PCR testing may be appropriate in some circumstances. The expected result is Negative. Fact Sheet for Patients: PodPark.tn Fact Sheet for Providers: GiftContent.is  This test is not yet approved or cleared by the [place] FDA and  has been authorized for detection and/or diagnosis of SARS-CoV-2 by FDA under an Emergency Use Authorization (EUA).  This EUA will remain in effect (meaning this test can be used) for the duration of  the COVID-19 decla ration under Section 564(b)(1) of the Act, 21 U.S.C. section 360bbb-3(b)(1), unless the authorization is terminated or revoked sooner. Performed at [facility], [address] [place], [place] [address]      Medications:   . carbidopa-levodopa  2 tablet Oral QHS  . carbidopa-levodopa  2 tablet Oral 3 times per day   And  . carbidopa-levodopa  1 tablet Oral QHS  . Chlorhexidine Gluconate Cloth  6 each Topical Daily  . dexamethasone (DECADRON) injection  6 mg Intravenous Q24H  . enoxaparin (LOVENOX) injection  40 mg Subcutaneous Q24H  . feeding supplement (ENSURE ENLIVE)  237 mL Oral TID BM  . insulin aspart  0-9 Units Subcutaneous TID WC  . metoprolol tartrate  25 mg Oral BID  . rOPINIRole  3 mg Oral Q24H  . sodium chloride flush  3 mL Intravenous Q12H  . sodium chloride flush  3 mL Intravenous Q12H   Continuous Infusions: . sodium chloride    . azithromycin 500 mg (01/20/20 0306)  . cefTRIAXone (ROCEPHIN)  IV 2 g (01/20/20 0201)  . remdesivir 100 mg in NS 100 mL Stopped (01/19/20 1931)      LOS: 2 days   Charlynne Cousins  [facility]  01/20/2020, 7:43 AM

## 2020-01-20 NOTE — Progress Notes (Signed)
[id number] - patient continues to desat and not maintaining his sats despite [place] and NRB. MD at bedside. Interpreter services being used to facilitate education and explaining of treatment and transfer need for ICU. 40mg  IV lasix ordered and given as documented in [facility]. ABG completed, see results. Patient transferred to 208-3 and report given to [facility] prior to transfer with all questions answered. All belongings taken with patient to include his guitar, cell phone and charger, slippers, black jacket, and clothes.

## 2020-01-20 NOTE — Progress Notes (Signed)
O2 sat mostly in 70's at this time.  He was off NRB for part of the afternoon, but is now back on that with [place] 100% 50L as well.  I have changed the pulse ox probe to multiple different locations to ensure this is accurate.  Pt does not appear in any distress and is breathing no faster than previous.  Notified attending of change.

## 2020-01-20 NOTE — Progress Notes (Signed)
Physical Therapy Treatment Patient Details Name: Christopher Travis MRN: [id number] DOB: 1946/05/23 Today's Date: 01/20/2020    History of Present Illness  74 y.o. male with medical history significant for Parkinson disease and non-Hodgkin lymphoma in remission, who developed subjective fevers and malaise roughly a week ago after a family member with asymptomatic COVID visited him. He went on to develop SOB 2-3 days ago, was monitoring oxygen saturations at home, began to see readings in 80's, and [facility] for that reasonFormally dx with acute respiratory failure with hypoxia secondary to COIVD 19 PNA and possibly CAP, sepsis, AKI, hyperbilirubinema.      PT Comments    Pt seen with OT, smiling, talking, upright in bed with O2 sats in the mid to upper 80s, dropping to the low 80s with mobilty on HHFNC and NRB mask.  Pt tolerated transfer well with two person assist (only one person physical assist, second person used to help manage lines/for safety).   I would recommend using the video interpreter next session as he speaks very little English and it would be helpful.  PT will continue to follow acutely for safe mobility progression.  Follow Up Recommendations  Home health PT;Supervision/Assistance - 24 hour     Equipment Recommendations  Rolling walker with 5" wheels    Recommendations for Other Services   NA     Precautions / Restrictions Precautions Precautions: Fall;Other (comment) Precaution Comments: Parkinson's with tremors, high amounts of O2    Mobility  Bed Mobility Overal bed mobility: Needs Assistance Bed Mobility: Supine to Sit     Supine to sit: Min assist;HOB elevated     General bed mobility comments: Min assist to support trunk to come to EOB, HOB elevated and Pt using railing.   Transfers Overall transfer level: Needs assistance Equipment used: 1 person hand held assist Transfers: Sit to/from [facility] Sit to Stand: Min assist Stand pivot  transfers: +2 safety/equipment;Min assist       General transfer comment: Min assist to stand multiple times from EOB with therapist's hand held assist and bed rail.  Heavier min assist to transfer with second person for line management and safety.  Pt staggering with one LOB taking pivotal steps to the chair.    Ambulation/Gait             General Gait Details: NT as he is on HHFNC and the tubing is not long.          Balance Overall balance assessment: Needs assistance Sitting-balance support: Feet supported;No upper extremity supported Sitting balance-Leahy Scale: Good Sitting balance - Comments: Pt able to donn socks by bending forward in sitting, extra time needed due to tremors and WOB.    Standing balance support: Bilateral upper extremity supported Standing balance-Leahy Scale: Poor Standing balance comment: needs external support in standing for balance.                             Cognition Arousal/Alertness: Awake/alert Behavior During Therapy: WFL for tasks assessed/performed Overall Cognitive Status: Within Functional Limits for tasks assessed                                 General Comments: Would recommend interpreter next session, speaks very little [place].          General Comments General comments (skin integrity, edema, etc.): O2 sats in the low  80s with mobility and HHFNC 50L 100% FiO2 and NRB mask, O2 being moniotred by pedi earlobe probe.       Pertinent Vitals/Pain Pain Assessment: No/denies pain           PT Goals (current goals can now be found in the care plan section) Acute Rehab PT Goals Patient Stated Goal: would like to get back home to spouse Progress towards PT goals: Progressing toward goals    Frequency    Min 3X/week      PT Plan Current plan remains appropriate    Co-evaluation PT/OT/SLP Co-Evaluation/Treatment: Yes Reason for Co-Treatment: Complexity of the patient's impairments  (multi-system involvement);Necessary to address cognition/behavior during functional activity;For patient/therapist safety;To address functional/ADL transfers PT goals addressed during session: Mobility/safety with mobility;Balance;Strengthening/ROM        AM-PAC PT "6 Clicks" Mobility   Outcome Measure  Help needed turning from your back to your side while in a flat bed without using bedrails?: A Little Help needed moving from lying on your back to sitting on the side of a flat bed without using bedrails?: A Little Help needed moving to and from a bed to a chair (including a wheelchair)?: A Little Help needed standing up from a chair using your arms (e.g., wheelchair or bedside chair)?: A Little Help needed to walk in hospital room?: A Little Help needed climbing 3-5 steps with a railing? : A Lot 6 Click Score: 17    End of Session Equipment Utilized During Treatment: Oxygen Activity Tolerance: Patient tolerated treatment well Patient left: in chair;Other (comment)(chair alarm in chair, however, no boxes in the ICU)   PT Visit Diagnosis: Unsteadiness on feet (R26.81);Other abnormalities of gait and mobility (R26.89);Muscle weakness (generalized) (M62.81)     Time: [id number] PT Time Calculation (min) (ACUTE ONLY): 33 min  Charges:  $Therapeutic Activity: 8-22 mins            Verdene Lennert, PT, DPT  Acute Rehabilitation [phone] pager #[phone] office  @ [place] [place]: [phone]            01/20/2020, 3:07 PM

## 2020-01-20 NOTE — Progress Notes (Signed)
Christopher Travis has been up all night. No signs of sleeping. Constantly taking NRB mask off and quickly desats to the 70's. Signs of reserve diminishing throughout the night. Constantly having to remind patient to keep oxygen on and not to take it off. Patient continues to be fidgety throughout the night playing on his phone, and at one time playing his guitar. External catheter placed on patient due to incontinence.

## 2020-01-20 NOTE — Evaluation (Signed)
Occupational Therapy Evaluation Patient Details Name: Christopher Travis MRN: [id number] DOB: 1946/07/02 Today's Date: 01/20/2020    History of Present Illness  74 y.o. male with medical history significant for Parkinson disease and non-Hodgkin lymphoma in remission, who developed subjective fevers and malaise roughly a week ago after a family member with asymptomatic COVID visited him. He went on to develop SOB 2-3 days ago, was monitoring oxygen saturations at home, began to see readings in 80's, and [facility] for that reasonFormally dx with acute respiratory failure with hypoxia secondary to COIVD 19 PNA and possibly CAP, sepsis, AKI, hyperbilirubinema.     Clinical Impression   PTA, pt was living with his wife and was independent and enjoyed playing guitar and painting. Pt currently requiring Min A +2 for LB ADLs and functional mobility. Upon arrival, pt alert and supine in bed; SpO2 in high 80s on 50L HHHFNC and 15L NRB. SpO2 dropping to low 80s during activity, but quickly returned to >87% with seated rest break. Pt would benefit from further acute OT to facilitate safe dc. Recommend dc to home with HHOT for further OT to optimize safety, independence with ADLs, and return to PLOF.      Follow Up Recommendations  Home health OT;Supervision/Assistance - 24 hour    Equipment Recommendations  3 in 1 bedside commode    Recommendations for Other Services PT consult     Precautions / Restrictions Precautions Precautions: Fall;Other (comment) Precaution Comments: Parkinson's with tremors, high amounts of O2 Restrictions Weight Bearing Restrictions: No      Mobility Bed Mobility Overal bed mobility: Needs Assistance Bed Mobility: Supine to Sit     Supine to sit: Min assist;HOB elevated     General bed mobility comments: Min assist to support trunk to come to EOB, HOB elevated and Pt using railing.   Transfers Overall transfer level: Needs assistance Equipment used: 1 person  hand held assist Transfers: Sit to/from [facility] Sit to Stand: Min assist Stand pivot transfers: +2 safety/equipment;Min assist       General transfer comment: Min assist to stand multiple times from EOB with therapist's hand held assist and bed rail.  Heavier min assist to transfer with second person for line management and safety.  Pt staggering with one LOB taking pivotal steps to the chair.      Balance Overall balance assessment: Needs assistance Sitting-balance support: Feet supported;No upper extremity supported Sitting balance-Leahy Scale: Good Sitting balance - Comments: Pt able to donn socks by bending forward in sitting, extra time needed due to tremors and WOB.    Standing balance support: Bilateral upper extremity supported Standing balance-Leahy Scale: Poor Standing balance comment: needs external support in standing for balance.                            ADL either performed or assessed with clinical judgement   ADL Overall ADL's : Needs assistance/impaired Eating/Feeding: Set up;Sitting(thin liquids)   Grooming: Set up;Supervision/safety;Sitting   Upper Body Bathing: Set up;Supervision/ safety;Sitting   Lower Body Bathing: Minimal assistance;Sit to/from stand   Upper Body Dressing : Set up;Supervision/safety;Sitting   Lower Body Dressing: Minimal assistance;Sit to/from stand Lower Body Dressing Details (indicate cue type and reason): Pt donning his socks with increased effort and time while seated at EOB.  Min A for standing balance Toilet Transfer: Minimal assistance;+2 for safety/equipment;Ambulation(short distance; simulated to recliner)  Functional mobility during ADLs: Minimal assistance;+2 for safety/equipment General ADL Comments: Pt presenting with decreased balance, strength, and activity tolerance     Vision Baseline Vision/History: Wears glasses Wears Glasses: ("as needed") Patient Visual Report: No  change from baseline       Perception     Praxis      Pertinent Vitals/Pain Pain Assessment: No/denies pain     Hand Dominance Right   Extremity/Trunk Assessment Upper Extremity Assessment Upper Extremity Assessment: Generalized weakness(Baseline tremors)   Lower Extremity Assessment Lower Extremity Assessment: Generalized weakness   Cervical / Trunk Assessment Cervical / Trunk Assessment: Kyphotic   Communication Communication Communication: Prefers language other than English   Cognition Arousal/Alertness: Awake/alert Behavior During Therapy: WFL for tasks assessed/performed Overall Cognitive Status: Difficult to assess                                 General Comments: Pt following commands. Due to language barrier, unsure of problem solving and awareness. Would benefit from further assessment   General Comments  O2 sats in the low 80s with mobility and HHFNC 50L 100% FiO2 and NRB mask, O2 being monitored by pedi earlobe probe.     Exercises Exercises: Other exercises Other Exercises Other Exercises: reinforced use of flutter valve and IS   Shoulder Instructions      Home Living Family/patient expects to be discharged to:: Other (Comment)(lives in independent living) Living Arrangements: Spouse/significant other                                      Prior Functioning/Environment Level of Independence: Independent        Comments: Reports he was independent and enjoys playing guitar and painting        OT Problem List: Decreased strength;Decreased range of motion;Decreased activity tolerance;Impaired balance (sitting and/or standing);Decreased knowledge of use of DME or AE;Decreased knowledge of precautions;Cardiopulmonary status limiting activity      OT Treatment/Interventions: Therapeutic exercise;Self-care/ADL training;Energy conservation;DME and/or AE instruction;Therapeutic activities;Patient/family education    OT  Goals(Current goals can be found in the care plan section) Acute Rehab OT Goals Patient Stated Goal: would like to get back home to spouse OT Goal Formulation: With patient Time For Goal Achievement: 02/03/20 Potential to Achieve Goals: Good  OT Frequency: Min 2X/week   Barriers to D/C:            Co-evaluation PT/OT/SLP Co-Evaluation/Treatment: Yes Reason for Co-Treatment: Complexity of the patient's impairments (multi-system involvement);For patient/therapist safety;To address functional/ADL transfers PT goals addressed during session: Mobility/safety with mobility;Balance;Strengthening/ROM OT goals addressed during session: ADL's and self-care      AM-PAC OT "6 Clicks" Daily Activity     Outcome Measure Help from another person eating meals?: None Help from another person taking care of personal grooming?: A Little Help from another person toileting, which includes using toliet, bedpan, or urinal?: A Little Help from another person bathing (including washing, rinsing, drying)?: A Little Help from another person to put on and taking off regular upper body clothing?: None Help from another person to put on and taking off regular lower body clothing?: A Little 6 Click Score: 20   End of Session Equipment Utilized During Treatment: Oxygen Nurse Communication: Mobility status;Other (comment)(chair alarm pad in place; no alarm box)  Activity Tolerance: Patient tolerated treatment well Patient left: in chair;with call bell/phone  within reach  OT Visit Diagnosis: Unsteadiness on feet (R26.81);Other abnormalities of gait and mobility (R26.89);Muscle weakness (generalized) (M62.81);Pain                Time: 1311-1344 OT Time Calculation (min): 33 min Charges:  OT General Charges $OT Visit: 1 Visit OT Evaluation $OT Eval Moderate Complexity: [place], OTR/L Acute Rehab Pager: [phone] Office: [place] 01/20/2020, 4:59 PM

## 2020-01-20 NOTE — Progress Notes (Signed)
MD paged regarding patient's sats, respiratory status including use of accessory muscles.

## 2020-01-20 NOTE — Progress Notes (Signed)
Patient was seen for worsening hypoxia and increasing RR and WOB despite heated HF [place] at 45 lpm plus NRB.   He is awake and alert with RR upper 30's, saturation mid-upper 80's, and accessory muscle use. Hemodynamics appear stable.   Video interpreter was used, patient denies chest pain, denies feeling more short of breath this morning.   Per documented I/O's, he is net positive >1 L and Lasix 40 mg IV was given.   ABG will be obtained and patient moved to ICU for closer monitoring. Full code status was confirmed with patient using interpreter. Wife and daughter were updated.

## 2020-01-21 ENCOUNTER — Inpatient Hospital Stay (HOSPITAL_COMMUNITY): Payer: Medicare Other

## 2020-01-21 DIAGNOSIS — R7989 Other specified abnormal findings of blood chemistry: Secondary | ICD-10-CM

## 2020-01-21 LAB — COMPREHENSIVE METABOLIC PANEL
ALT: 14 U/L (ref 0–44)
AST: 66 U/L — ABNORMAL HIGH (ref 15–41)
Albumin: 3.1 g/dL — ABNORMAL LOW (ref 3.5–5.0)
Alkaline Phosphatase: 195 U/L — ABNORMAL HIGH (ref 38–126)
Anion gap: 22 — ABNORMAL HIGH (ref 5–15)
BUN: 55 mg/dL — ABNORMAL HIGH (ref 8–23)
CO2: 15 mmol/L — ABNORMAL LOW (ref 22–32)
Calcium: 8.4 mg/dL — ABNORMAL LOW (ref 8.9–10.3)
Chloride: 106 mmol/L (ref 98–111)
Creatinine, Ser: 1.25 mg/dL — ABNORMAL HIGH (ref 0.61–1.24)
GFR calc Af Amer: >60 mL/min (ref >60)
GFR calc non Af Amer: 56 mL/min — ABNORMAL LOW (ref >60)
Glucose, Bld: 246 mg/dL — ABNORMAL HIGH (ref 70–99)
Potassium: 3.8 mmol/L (ref 3.5–5.1)
Sodium: 143 mmol/L (ref 135–145)
Total Bilirubin: 1.2 mg/dL (ref 0.3–1.2)
Total Protein: 7.2 g/dL (ref 6.5–8.1)

## 2020-01-21 LAB — D-DIMER, QUANTITATIVE: D-Dimer, Quant: >20 ug/mL-FEU — ABNORMAL HIGH (ref 0.00–0.50)

## 2020-01-21 LAB — CBC WITH DIFFERENTIAL/PLATELET
Abs Immature Granulocytes: 0.45 10*3/uL — ABNORMAL HIGH (ref 0.00–0.07)
Basophils Absolute: 0.1 10*3/uL (ref 0.0–0.1)
Basophils Relative: 0 %
Eosinophils Absolute: 0 10*3/uL (ref 0.0–0.5)
Eosinophils Relative: 0 %
HCT: 51.8 % (ref 39.0–52.0)
Hemoglobin: 17.5 g/dL — ABNORMAL HIGH (ref 13.0–17.0)
Immature Granulocytes: 1 %
Lymphocytes Relative: 4 %
Lymphs Abs: 1.3 10*3/uL (ref 0.7–4.0)
MCH: 31.8 pg (ref 26.0–34.0)
MCHC: 33.8 g/dL (ref 30.0–36.0)
MCV: 94 fL (ref 80.0–100.0)
Monocytes Absolute: 0.7 10*3/uL (ref 0.1–1.0)
Monocytes Relative: 2 %
Neutro Abs: 31.8 10*3/uL — ABNORMAL HIGH (ref 1.7–7.7)
Neutrophils Relative %: 93 %
Platelets: 251 10*3/uL (ref 150–400)
RBC: 5.51 MIL/uL (ref 4.22–5.81)
RDW: 14.3 % (ref 11.5–15.5)
WBC: 34.3 10*3/uL — ABNORMAL HIGH (ref 4.0–10.5)
nRBC: 0 % (ref 0.0–0.2)

## 2020-01-21 LAB — GLUCOSE, CAPILLARY
Glucose-Capillary: 215 mg/dL — ABNORMAL HIGH (ref 70–99)
Glucose-Capillary: 167 mg/dL — ABNORMAL HIGH (ref 70–99)
Glucose-Capillary: 201 mg/dL — ABNORMAL HIGH (ref 70–99)
Glucose-Capillary: 218 mg/dL — ABNORMAL HIGH (ref 70–99)

## 2020-01-21 LAB — TROPONIN I (HIGH SENSITIVITY)
Troponin I (High Sensitivity): 21 ng/L — ABNORMAL HIGH (ref <18)
Troponin I (High Sensitivity): 24 ng/L — ABNORMAL HIGH (ref <18)

## 2020-01-21 LAB — C-REACTIVE PROTEIN: CRP: 34.1 mg/dL — ABNORMAL HIGH (ref <1.0)

## 2020-01-21 LAB — FERRITIN: Ferritin: 1930 ng/mL — ABNORMAL HIGH (ref 24–336)

## 2020-01-21 LAB — PROCALCITONIN: Procalcitonin: 2.2 ng/mL

## 2020-01-21 MED ORDER — METOPROLOL TARTRATE 5 MG/5ML IV SOLN
2.5000 mg | INTRAVENOUS | Status: DC | PRN
  Administered 2020-01-21: 2.5 mg via INTRAVENOUS
  Filled 2020-01-21: qty 5

## 2020-01-21 MED ORDER — NITROGLYCERIN 0.4 MG SL SUBL: 0.4000 mg | SUBLINGUAL_TABLET | SUBLINGUAL | Status: DC | PRN

## 2020-01-21 MED ORDER — ASPIRIN 81 MG PO CHEW
CHEWABLE_TABLET | ORAL | Status: AC
  Filled 2020-01-21: qty 1

## 2020-01-21 MED ORDER — ENOXAPARIN SODIUM 40 MG/0.4ML ~~LOC~~ SOLN: 40.0000 mg | Freq: Two times a day (BID) | SUBCUTANEOUS | Status: DC

## 2020-01-21 MED ORDER — ASPIRIN 81 MG PO CHEW
CHEWABLE_TABLET | ORAL | Status: AC
  Administered 2020-01-21: 81 mg
  Filled 2020-01-21: qty 1

## 2020-01-21 MED ORDER — MORPHINE SULFATE (PF) 2 MG/ML IV SOLN
2.0000 mg | INTRAVENOUS | Status: DC | PRN
  Administered 2020-01-21 – 2020-01-23 (×13): 2 mg via INTRAVENOUS
  Filled 2020-01-21 (×13): qty 1

## 2020-01-21 MED ORDER — METOPROLOL TARTRATE 25 MG PO TABS
50.0000 mg | ORAL_TABLET | Freq: Two times a day (BID) | ORAL | Status: DC
  Administered 2020-01-21 – 2020-01-27 (×5): 50 mg via ORAL
  Filled 2020-01-21 (×7): qty 2

## 2020-01-21 MED ORDER — ASPIRIN 81 MG PO CHEW
162.0000 mg | CHEWABLE_TABLET | Freq: Once | ORAL | Status: AC
  Administered 2020-01-21: 162 mg via ORAL

## 2020-01-21 MED ORDER — ENOXAPARIN SODIUM 100 MG/ML ~~LOC~~ SOLN
1.0000 mg/kg | Freq: Two times a day (BID) | SUBCUTANEOUS | Status: DC
  Administered 2020-01-21 – 2020-01-22 (×2): 85 mg via SUBCUTANEOUS
  Filled 2020-01-21 (×4): qty 1

## 2020-01-21 MED ORDER — METOPROLOL TARTRATE 5 MG/5ML IV SOLN
5.0000 mg | Freq: Four times a day (QID) | INTRAVENOUS | Status: DC | PRN
  Administered 2020-01-21 – 2020-01-27 (×6): 5 mg via INTRAVENOUS
  Filled 2020-01-21 (×6): qty 5

## 2020-01-21 MED ORDER — SODIUM CHLORIDE 0.9 % IV SOLN: INTRAVENOUS | Status: DC

## 2020-01-21 MED ORDER — NITROGLYCERIN 0.4 MG SL SUBL
SUBLINGUAL_TABLET | SUBLINGUAL | Status: AC
  Administered 2020-01-21: 0.4 mg via SUBLINGUAL
  Filled 2020-01-21: qty 2

## 2020-01-21 NOTE — Progress Notes (Signed)
[facility] PROGRESS NOTE    Progress Note  Christopher Travis  ULA:[id number] DOB: [birth year] DOA: 01/30/2020 PCP: System, Pcp Not In     Brief Narrative:   Christopher Travis is an 74 y.o. male past medical history significant for Parkinson's disease and non-Hodgkin's lymphoma in remission who developed subjective fever malaise about 1 week prior to admission, started developing shortness of breath about 2 to 3 days ago at home his sats began reading 80% presented to [facility] found to be hypoxic,  Tachypneic chest x-ray showed bilateral infiltrates, SARS-CoV-2 PCR, his creatinine was 1.3 (up from baseline 1.0) elevated bili Ruben with a leukocytosis lactic acid of 4.3 procalcitonin 2.5 D-dimer 1.28 blood cultures ordered was fluid resuscitated started on IV Decadron on remdesivir.  Assessment/Plan:   Acute respiratory failure with hypoxia 2/2 Pneumonia due to COVID-19 virus and probably community-acquired pneumonia: Christopher Travis is now requiring heated high flow at an FiO2 of 100% with a flow rate of 40 to keep saturations greater than 89%.  His respiration rate this morning appears to be elevated above 30, he is status post 2 doses of convalescent plasma, he is currently on IV remdesivir and steroids and the family agreed yesterday to IV Actemra. But this his saturations continue to worsen, he continues to be on IV Rocephin and azithromycin and his CRP and D-dimer continues to climb.  His D-dimer was greater than 20 his CRP is 34, will get a lower extremity Doppler and CT angio of the chest to rule out a thromboembolic event. I had a long discussion with the family about his worsening condition including but not limited to his respiratory status and cardiac.  I have expressed to them that he has an extremely poor prognosis. They relate they would like to have a meeting with the family on how to proceed forward. They would like the current treatment to be maintained and not to escalate his care,  they did agree to IV morphine to help with the respiration.  Sepsis possibly due to community-acquired pneumonia: He has remained afebrile with no leukocytosis, continue IV Rocephin and azithromycin culture data has remained negative.  Acute kidney injury: This morning his creatinine is worsening, in the setting of sepsis picture.  With her worsening bicarbonate, will start him on gentle IV fluid hydration.   Hyperbilirubinemia Abdominal exam is benign, now resolved.  Parkinson's disease continue current medication.  Essential hypertension: Holding antihypertensive medication due to the sepsis picture.  Goals of care/ethics: I had a long discussion with the family the patient on 01/19/2020, expressed that he did not want to be intubated or resuscitated this was expressed to the family.  I have expressed to the family my concerns about his extremely poor prognosis they would like to talk it over and give it a few more hours to see if he turns around to have explained to him that it is unlikely but we would do as they wish.  We will call him again this afternoon.   DVT prophylaxis: Lovenox Family Communication:daughter Disposition Plan/Barrier to D/C: Once he is off oxygen has completed his treatment.  Code Status:     Code Status Orders  (From admission, onward)         Start     Ordered   01/18/20 0147  Full code  Continuous     01/18/20 0149        Code Status History    This patient has a current code status but no  historical code status.   Advance Care Planning Activity        IV Access:    Peripheral IV   Procedures and diagnostic studies:   No results found.   Medical Consultants:    None.  Anti-Infectives:   IV remdesivir Rocephin Azithro   Subjective:    Christopher Travis lethargic mumbling.  Objective:    Vitals:   01/21/20 0600 01/21/20 0615 01/21/20 0730 01/21/20 0800  BP: 136/73 134/80    Pulse: (!) 119 (!) 108    Resp: (!) 36 (!)  26    Temp:   97.8 F (36.6 C)   TempSrc:   Oral   SpO2: (!) 88% (!) 86%  (!) 85%  Weight:      Height:       SpO2: (!) 85 % O2 Flow Rate (L/min): 45 L/min FiO2 (%): 100 %   Intake/Output Summary (Last 24 hours) at 01/21/2020 0918 Last data filed at 01/21/2020 0400 Gross per 24 hour  Intake 1271.42 ml  Output 850 ml  Net 421.42 ml   Filed Weights   01/24/2020 2027 01/18/20 0400 01/19/20 0419  Weight: 88.9 kg 85.8 kg 85.9 kg    Exam: General exam: In no acute distress. Respiratory system: Good air movement and diffuse crackles bilaterally Cardiovascular system: S1 & S2 heard, RRR. No JVD. Gastrointestinal system: Abdomen is nondistended, soft and nontender.  Extremities: No pedal edema. Skin: No rashes, lesions or ulcers  Data Reviewed:    Labs: Basic Metabolic Panel: Recent Labs  Lab 01/13/2020 1944 01/16/2020 1944 01/18/20 0555 01/18/20 0555 01/19/20 0225 01/19/20 0225 01/20/20 0134 01/20/20 0134 01/20/20 0535 01/21/20 0544  NA 133*   < > 137  --  139  --  143  --  144 143  K 3.7   < > 4.1   < > 3.6   < > 3.9   < > 3.8 3.8  CL 98  --  105  --  106  --  109  --   --  106  CO2 19*  --  21*  --  20*  --  21*  --   --  15*  GLUCOSE 162*  --  191*  --  207*  --  190*  --   --  246*  BUN 30*  --  32*  --  35*  --  38*  --   --  55*  CREATININE 1.36*  --  1.05  --  1.12  --  1.00  --   --  1.25*  CALCIUM 8.8*  --  8.4*  --  8.5*  --  8.8*  --   --  8.4*  MG  --   --   --   --  1.8  --   --   --   --   --    < > = values in this interval not displayed.   GFR Estimated Creatinine Clearance: 54.4 mL/min (A) (by C-G formula based on SCr of 1.25 mg/dL (H)). Liver Function Tests: Recent Labs  Lab 01/19/2020 1944 01/18/20 0555 01/19/20 0225 01/20/20 0134 01/21/20 0544  AST 40 32 45* 57* 66*  ALT _0 ALKPHOS 175* 147* 143* 147* 195*  BILITOT 2.1* 1.0 1.2 0.7 1.2  PROT 7.5 6.8 6.5 6.7 7.2  ALBUMIN 3.7 3.3* 2.9* 3.3* 3.1*   No results for input(s): LIPASE,  AMYLASE in the last 168 hours. No results for input(s): AMMONIA in  the last 168 hours. Coagulation profile No results for input(s): INR, PROTIME in the last 168 hours. COVID-19 Labs  Recent Labs    01/19/20 0225 01/20/20 0134 01/21/20 0544  DDIMER 1.67* 4.67* >20.00*  FERRITIN 924* 1,478* 1,930*  CRP 28.6* 28.9* 34.1*    No results found for: SARSCOV2NAA  CBC: Recent Labs  Lab 01/06/2020 1944 01/07/2020 1944 01/18/20 0555 01/19/20 0225 01/20/20 0134 01/20/20 0535 01/21/20 0544  WBC 15.0*  --  10.2 18.3* 21.4*  --  34.3*  NEUTROABS 13.2*  --  9.4* 17.0* 20.2*  --  31.8*  HGB 15.2   < > 14.3 13.6 14.6 15.0 17.5*  HCT 43.7   < > 41.1 40.1 42.3 44.0 51.8  MCV 93.4  --  92.4 92.8 92.6  --  94.0  PLT 198  --  137* 172 203  --  251   < > = values in this interval not displayed.   Cardiac Enzymes: No results for input(s): CKTOTAL, CKMB, CKMBINDEX, TROPONINI in the last 168 hours. BNP (last 3 results) No results for input(s): PROBNP in the last 8760 hours. CBG: Recent Labs  Lab 01/20/20 0719 01/20/20 1143 01/20/20 1600 01/20/20 2246 01/21/20 0728  GLUCAP 155* 148* 275* 161* 215*   D-Dimer: Recent Labs    01/20/20 0134 01/21/20 0544  DDIMER 4.67* >20.00*   Hgb A1c: No results for input(s): HGBA1C in the last 72 hours. Lipid Profile: No results for input(s): CHOL, HDL, LDLCALC, TRIG, CHOLHDL, LDLDIRECT in the last 72 hours. Thyroid function studies: No results for input(s): TSH, T4TOTAL, T3FREE, THYROIDAB in the last 72 hours.  Invalid input(s): FREET3 Anemia work up: Recent Labs    01/20/20 0134 01/21/20 0544  FERRITIN 1,478* 1,930*   Sepsis Labs: Recent Labs  Lab 01/08/2020 1944 01/27/2020 1944 01/30/2020 2159 01/18/20 0555 01/19/20 0225 01/20/20 0134 01/21/20 0544  PROCALCITON 2.55   < >  --  3.70 3.21 2.24 2.20  WBC 15.0*   < >  --  10.2 18.3* 21.4* 34.3*  LATICACIDVEN 4.3*  --  1.8  --   --   --   --    < > = values in this interval not displayed.     Microbiology Recent Results (from the past 240 hour(s))  Blood culture (routine x 2)     Status: Abnormal   Collection Time: 01/31/2020  7:25 PM   Specimen: BLOOD  Result Value Ref Range Status   Specimen Description   Final    BLOOD BLOOD LEFT FOREARM Performed at [facility], [address] [place], [place] [address]    Special Requests   Final    BOTTLES DRAWN AEROBIC AND ANAEROBIC Blood Culture adequate volume Performed at [facility], [address] [place], [place] [address]    Culture  Setup Time   Final    GRAM POSITIVE COCCI AEROBIC BOTTLE ONLY CRITICAL RESULT CALLED TO, READ BACK BY AND VERIFIED WITH: PHARMD MAY BALL _0  01/18/20 AKT    Culture (A)  Final    STAPHYLOCOCCUS SPECIES (COAGULASE NEGATIVE) THE SIGNIFICANCE OF ISOLATING THIS ORGANISM FROM A SINGLE SET OF BLOOD CULTURES WHEN MULTIPLE SETS ARE DRAWN IS UNCERTAIN. PLEASE NOTIFY THE MICROBIOLOGY DEPARTMENT WITHIN ONE WEEK IF SPECIATION AND SENSITIVITIES ARE REQUIRED. Performed at [facility], [place] [address] [place], [place] [address]    Report Status 01/19/2020 FINAL  Final  Blood culture (routine x 2)     Status: None (Preliminary result)  Collection Time: 01/23/2020  7:54 PM   Specimen: BLOOD  Result Value Ref Range Status   Specimen Description   Final    BLOOD RIGHT ANTECUBITAL Performed at [facility], [address] [place], [place] [address]    Special Requests   Final    BOTTLES DRAWN AEROBIC AND ANAEROBIC Blood Culture adequate volume Performed at [facility], [address] [place], [place] [address]    Culture   Final    NO GROWTH 4 DAYS Performed at [facility], [place] [address], [place], [place] [address]    Report Status PENDING  Incomplete  SARS Coronavirus 2 Ag (30 min TAT) - Nasal Swab (BD Veritor Kit)     Status: Abnormal   Collection Time: 01/19/2020  7:59 PM   Specimen: Nasal Swab (BD Veritor Kit)  Result Value Ref Range  Status   SARS Coronavirus 2 Ag POSITIVE (A) NEGATIVE Final    Comment: RESULT CALLED TO, READ BACK BY AND VERIFIED WITH: KELLY NEAL RN _0  01/07/2020 OLSONM (NOTE) SARS-CoV-2 antigen PRESENT. Positive results indicate the presence of viral antigens, but clinical correlation with patient history and other diagnostic information is necessary to determine patient infection status.  Positive results do not rule out bacterial infection or co-infection  with other viruses. False positive results are rare but can occur, and confirmatory RT-PCR testing may be appropriate in some circumstances. The expected result is Negative. Fact Sheet for Patients: PodPark.tn Fact Sheet for Providers: GiftContent.is  This test is not yet approved or cleared by the [place] FDA and  has been authorized for detection and/or diagnosis of SARS-CoV-2 by FDA under an Emergency Use Authorization (EUA).  This EUA will remain in effect (meaning this test can be used) for the duration of  the COVID-19 decla ration under Section 564(b)(1) of the Act, 21 U.S.C. section 360bbb-3(b)(1), unless the authorization is terminated or revoked sooner. Performed at [facility], [address] [place], [place] [address]   MRSA PCR Screening     Status: None   Collection Time: 01/20/20  9:40 AM   Specimen: Nasopharyngeal  Result Value Ref Range Status   MRSA by PCR NEGATIVE NEGATIVE Final    Comment:        The GeneXpert MRSA Assay (FDA approved for NASAL specimens only), is one component of a comprehensive MRSA colonization surveillance program. It is not intended to diagnose MRSA infection nor to guide or monitor treatment for MRSA infections. Performed at [facility], [place] [address] [place], [place] [address]      Medications:   . aspirin      . carbidopa-levodopa  2 tablet Oral QHS  . carbidopa-levodopa  2 tablet  Oral 3 times per day   And  . carbidopa-levodopa  1 tablet Oral QHS  . chlorhexidine  15 mL Mouth Rinse BID  . Chlorhexidine Gluconate Cloth  6 each Topical Daily  . dexamethasone (DECADRON) injection  6 mg Intravenous Q24H  . enoxaparin (LOVENOX) injection  40 mg Subcutaneous Q12H  . feeding supplement (ENSURE ENLIVE)  237 mL Oral TID BM  . insulin aspart  0-9 Units Subcutaneous TID WC  . mouth rinse  15 mL Mouth Rinse q12n4p  . metoprolol tartrate  50 mg Oral BID  . rOPINIRole  3 mg Oral Q24H  . sodium chloride flush  3 mL Intravenous Q12H  . sodium chloride flush  3 mL Intravenous Q12H  Continuous Infusions: . sodium chloride    . azithromycin 500 mg (01/21/20 0451)  . cefTRIAXone (ROCEPHIN)  IV 2 g (01/21/20 0357)  . remdesivir 100 mg in NS 100 mL Stopped (01/20/20 1027)      LOS: 3 days   Charlynne Cousins  [facility]  01/21/2020, 9:18 AM

## 2020-01-21 NOTE — Progress Notes (Signed)
This svt run was during 7a-7pshift that was called to inform me, pt is okay at this time nsr

## 2020-01-21 NOTE — Progress Notes (Signed)
PT Cancellation Note  Patient Details Name: Christopher Travis MRN: [id number] DOB: Jan 23, 1946   Cancelled Treatment:    Reason Eval/Treat Not Completed: Medical issues which prohibited therapy.  Per RN pt not alert with medical issues (see previous notes from today).  Not appropriate for PT at this time.  PT to follow acutely until further decisions are made re: continued treatment.   Thanks,  Verdene Lennert, PT, DPT  Acute Rehabilitation [phone] pager [phone] office  @ [facility]: [phone]     Harvie Heck 01/21/2020, 8:54 AM

## 2020-01-21 NOTE — Progress Notes (Addendum)
[facility] Progress Note Patient Name: Christopher Travis DOB: 16-Dec-1945 MRN: [id number]   Date of Service  01/21/2020  HPI/Events of Note  Patient noted to be tachycardic in the 120s. Complaining of chest pain 8/10. BP 139/94.  Possible afib on telemetry  eICU Interventions  SL nitro Metoprolol 2.5 mg IV x 3 Aspirin EKG and troponin  Chest pain resolved after 1 nitro     Intervention Category Major Interventions: Other:  Judd Lien 01/21/2020, 5:56 AM

## 2020-01-21 NOTE — Progress Notes (Signed)
Inpatient Diabetes Program Recommendations  AACE/ADA: New Consensus Statement on Inpatient Glycemic Control (2015)  Target Ranges:  Prepandial:   less than 140 mg/dL      Peak postprandial:   less than 180 mg/dL (1-2 hours)      Critically ill patients:  140 - 180 mg/dL   Lab Results  Component Value Date   GLUCAP 215 (H) 01/21/2020    Review of Glycemic Control Results for Christopher Travis, Christopher Travis (MRN [id number]) as of 01/21/2020 11:25  Ref. Range 01/20/2020 07:19 01/20/2020 11:43 01/20/2020 16:00 01/20/2020 22:46 01/21/2020 07:28  Glucose-Capillary Latest Ref Range: 70 - 99 mg/dL 155 (H) 148 (H) 275 (H) 161 (H) 215 (H)   Diabetes history: None noted  Current orders for Inpatient glycemic control:  Novolog 0-9 units tid   Decadron 6 mg Q24 hours Ensure Enlive tid between emals  Inpatient Diabetes Program Recommendations:   Consider the following:  Add Novolog hs scale. Add Levemir 6 units.  Thanks  Tama Headings RN, MSN, BC-ADM Inpatient Diabetes Coordinator Team Pager [phone] (8a-5p)

## 2020-01-21 NOTE — Progress Notes (Signed)
ANTICOAGULATION CONSULT NOTE - Initial Consult  Pharmacy Consult for Lovenox Indication: DVT  Allergies  Allergen Reactions  . Iodine Swelling    Patient Measurements: Height: 5\' 11"  (180.3 cm) Weight: 189 lb 6 oz (85.9 kg) IBW/kg (Calculated) : 75.3  Vital Signs: Temp: 97.6 F (36.4 C) (02/16 1200) Temp Source: Axillary (02/16 1200) BP: 143/87 (02/16 1523) Pulse Rate: 86 (02/16 1523)  Labs: Recent Labs    01/19/20 0225 01/19/20 0225 01/20/20 0134 01/20/20 0134 01/20/20 0535 01/21/20 0544 01/21/20 0753  HGB 13.6   < > 14.6   < > 15.0 17.5*  --   HCT 40.1   < > 42.3  --  44.0 51.8  --   PLT 172  --  203  --   --  251  --   CREATININE 1.12  --  1.00  --   --  1.25*  --   TROPONINIHS  --   --   --   --   --  21* 24*   < > = values in this interval not displayed.    Estimated Creatinine Clearance: 54.4 mL/min (A) (by C-G formula based on SCr of 1.25 mg/dL (H)).   Medical History: Past Medical History:  Diagnosis Date  . Hypertension   . Non Hodgkin's lymphoma ([place])   . Parkinson disease ([place])     Medications:  Medications Prior to Admission  Medication Sig Dispense Refill Last Dose  . amLODipine-valsartan (EXFORGE) 5-320 MG tablet Take 1 tablet by mouth daily.     . carbidopa-levodopa (SINEMET CR) 50-200 MG tablet Take 2 tablets by mouth daily at 10 pm.     . carbidopa-levodopa (SINEMET IR) 25-100 MG tablet Take 1-2 tablets by mouth See admin instructions. Take 2 tablets by mouth at 0700, 2 tablets by mouth at noon, take 2 tablets by mouth at 1700, & take 1 tablet at 2200     . Cholecalciferol (VITAMIN D3 PO) Take 1 tablet by mouth daily.     . Cyanocobalamin (VITAMIN B-12 PO) Take 1 tablet by mouth daily.     Marland Kitchen rOPINIRole (REQUIP) 3 MG tablet Take 3 mg by mouth daily. (0700)       Assessment: 65 YOM with COVID-19 pneumonia. Dopplers this afternoon revealed an acute DVT. Pharmacy consulted to transition patient to treatment dose Lovenox.   CBC reviewed.  SCr up slightly to 1.25.   Goal of Therapy:  Anti-Xa level 0.6-1 units/ml 4hrs after LMWH dose given Monitor platelets by anticoagulation protocol: Yes   Plan:  -Start Lovenox 85 mg (1 mg/kg) twice daily  -Monitor CBC, renal fx and s/s of bleeding    Albertina Parr, PharmD., BCPS Clinical Pharmacist Clinical phone for 01/21/20 until 5pm: [phone]

## 2020-01-21 NOTE — Progress Notes (Signed)
Bilateral lower extremity venous duplex complete.  Please see CV proc tab for preliminary results. Lita Mains- RDMS, RVT 3:34 PM  01/21/2020

## 2020-01-21 NOTE — Progress Notes (Signed)
HR noted to be SVTs 150s. Pt complained of chest pain 8/10 on pain scale. No distress noted.   0600- Spoke to Dr Genevive Bi. EKG completed. No noted ST elevation. 0.4 mg Nitrostat, 2.5 mg Lopressor and 81 mg aspirin ordered.   [id number]- Pain 4/10 on pain scale.

## 2020-01-21 NOTE — Progress Notes (Signed)
Pt lethargic, jumping in and out of SVT. sats continue to be in 80s. Opens eyes to rigorous stimulation, but speech. Unable to follow commands. Laurey Arrow NP made aware, NP calling MD.

## 2020-01-22 ENCOUNTER — Inpatient Hospital Stay (HOSPITAL_COMMUNITY): Payer: Medicare Other

## 2020-01-22 LAB — BASIC METABOLIC PANEL
Anion gap: 21 — ABNORMAL HIGH (ref 5–15)
BUN: 113 mg/dL — ABNORMAL HIGH (ref 8–23)
CO2: 18 mmol/L — ABNORMAL LOW (ref 22–32)
Calcium: 8.3 mg/dL — ABNORMAL LOW (ref 8.9–10.3)
Chloride: 108 mmol/L (ref 98–111)
Creatinine, Ser: 2.51 mg/dL — ABNORMAL HIGH (ref 0.61–1.24)
GFR calc Af Amer: 28 mL/min — ABNORMAL LOW (ref >60)
GFR calc non Af Amer: 24 mL/min — ABNORMAL LOW (ref >60)
Glucose, Bld: 208 mg/dL — ABNORMAL HIGH (ref 70–99)
Potassium: 5 mmol/L (ref 3.5–5.1)
Sodium: 147 mmol/L — ABNORMAL HIGH (ref 135–145)

## 2020-01-22 LAB — CBC WITH DIFFERENTIAL/PLATELET
Abs Immature Granulocytes: 0.36 10*3/uL — ABNORMAL HIGH (ref 0.00–0.07)
Basophils Absolute: 0.1 10*3/uL (ref 0.0–0.1)
Basophils Relative: 0 %
Eosinophils Absolute: 0 10*3/uL (ref 0.0–0.5)
Eosinophils Relative: 0 %
HCT: 47.6 % (ref 39.0–52.0)
Hemoglobin: 16.1 g/dL (ref 13.0–17.0)
Immature Granulocytes: 1 %
Lymphocytes Relative: 2 %
Lymphs Abs: 0.6 10*3/uL — ABNORMAL LOW (ref 0.7–4.0)
MCH: 31.8 pg (ref 26.0–34.0)
MCHC: 33.8 g/dL (ref 30.0–36.0)
MCV: 93.9 fL (ref 80.0–100.0)
Monocytes Absolute: 0.6 10*3/uL (ref 0.1–1.0)
Monocytes Relative: 2 %
Neutro Abs: 27.9 10*3/uL — ABNORMAL HIGH (ref 1.7–7.7)
Neutrophils Relative %: 95 %
Platelets: DECREASED 10*3/uL (ref 150–400)
RBC: 5.07 MIL/uL (ref 4.22–5.81)
RDW: 14.6 % (ref 11.5–15.5)
WBC: 29.6 10*3/uL — ABNORMAL HIGH (ref 4.0–10.5)
nRBC: 0 % (ref 0.0–0.2)

## 2020-01-22 LAB — COMPREHENSIVE METABOLIC PANEL
ALT: 10 U/L (ref 0–44)
AST: 60 U/L — ABNORMAL HIGH (ref 15–41)
Albumin: 2.9 g/dL — ABNORMAL LOW (ref 3.5–5.0)
Alkaline Phosphatase: 230 U/L — ABNORMAL HIGH (ref 38–126)
Anion gap: 20 — ABNORMAL HIGH (ref 5–15)
BUN: 90 mg/dL — ABNORMAL HIGH (ref 8–23)
CO2: 16 mmol/L — ABNORMAL LOW (ref 22–32)
Calcium: 8.4 mg/dL — ABNORMAL LOW (ref 8.9–10.3)
Chloride: 111 mmol/L (ref 98–111)
Creatinine, Ser: 2.15 mg/dL — ABNORMAL HIGH (ref 0.61–1.24)
GFR calc Af Amer: 34 mL/min — ABNORMAL LOW (ref >60)
GFR calc non Af Amer: 29 mL/min — ABNORMAL LOW (ref >60)
Glucose, Bld: 211 mg/dL — ABNORMAL HIGH (ref 70–99)
Potassium: 4.8 mmol/L (ref 3.5–5.1)
Sodium: 147 mmol/L — ABNORMAL HIGH (ref 135–145)
Total Bilirubin: 1.6 mg/dL — ABNORMAL HIGH (ref 0.3–1.2)
Total Protein: 6.3 g/dL — ABNORMAL LOW (ref 6.5–8.1)

## 2020-01-22 LAB — C-REACTIVE PROTEIN: CRP: 22.7 mg/dL — ABNORMAL HIGH (ref <1.0)

## 2020-01-22 LAB — GLUCOSE, CAPILLARY
Glucose-Capillary: 184 mg/dL — ABNORMAL HIGH (ref 70–99)
Glucose-Capillary: 224 mg/dL — ABNORMAL HIGH (ref 70–99)
Glucose-Capillary: 184 mg/dL — ABNORMAL HIGH (ref 70–99)
Glucose-Capillary: 202 mg/dL — ABNORMAL HIGH (ref 70–99)

## 2020-01-22 LAB — CULTURE, BLOOD (ROUTINE X 2)
Culture: NO GROWTH
Special Requests: ADEQUATE

## 2020-01-22 LAB — FERRITIN: Ferritin: 1890 ng/mL — ABNORMAL HIGH (ref 24–336)

## 2020-01-22 LAB — MAGNESIUM: Magnesium: 2.6 mg/dL — ABNORMAL HIGH (ref 1.7–2.4)

## 2020-01-22 LAB — D-DIMER, QUANTITATIVE: D-Dimer, Quant: >20 ug/mL-FEU — ABNORMAL HIGH (ref 0.00–0.50)

## 2020-01-22 LAB — PROCALCITONIN: Procalcitonin: 3.18 ng/mL

## 2020-01-22 MED ORDER — HEPARIN (PORCINE) 25000 UT/250ML-% IV SOLN
1100.0000 [IU]/h | INTRAVENOUS | Status: DC
  Administered 2020-01-22: 1400 [IU]/h via INTRAVENOUS
  Administered 2020-01-23: 15:00:00 1350 [IU]/h via INTRAVENOUS
  Administered 2020-01-24 – 2020-01-26 (×4): 1200 [IU]/h via INTRAVENOUS
  Administered 2020-01-27: 1100 [IU]/h via INTRAVENOUS
  Filled 2020-01-22 (×7): qty 250

## 2020-01-22 MED ORDER — SODIUM CHLORIDE 0.45 % IV SOLN: INTRAVENOUS | Status: DC

## 2020-01-22 MED ORDER — SODIUM BICARBONATE 8.4 % IV SOLN
100.0000 meq | Freq: Once | INTRAVENOUS | Status: AC
  Administered 2020-01-22: 100 meq via INTRAVENOUS
  Filled 2020-01-22: qty 100

## 2020-01-22 NOTE — Progress Notes (Signed)
MD notified:  sitter order still needed.  I've been able to wean him to 40 & 80% with NRB this afternoon and he did well with PT but continues to take his O2 off and drops.  [name] called after lunch for an update. I advised her MD would call pt's 'son' with an update, [name] stated ' there is no son, just 3 daughters'.  Daughters and wife attempted group video chat with 'What's app' on pt's phone but the signal wasn't good.  Did teleconference with my phone.

## 2020-01-22 NOTE — Progress Notes (Addendum)
Report received. Sitting up in bed on HFNC 30L/100% w/ sats 76- 86% and NRB. Increased HFNC to 40L. Condom catheter in place. Was reported that he was taken for head CT and Heparin gtt had been placed on Hold, and is not infusing at this time. Text page sent via Amnion to MD on call to get clarification about continuing Heparin Gtt or continuing to keep it on hold.  He is tachypneic 22-25 at this time. Breathing appears labored. Will use interpreter to complete full assessment. Remains 1:1 w/ this nurse in room at bedside. Continuing to monitor.   2013: MD sent secure chat and states to restart Heparin gtt. Notified MD of O2 sats 75-83% most of the time on 50 L / 100% HFNC now and NRB.

## 2020-01-22 NOTE — Progress Notes (Signed)
While this RN not in patient room, Patient removed both HFNC and NRB oxygen mask. Desaturation to low 70s and bradycardia in the 40s. Upon entering room, patient appeared pale and cyanotic around lips. Not immediately responding to voice. Both HFNC and NRB replaced. Sternal rub to assess responsiveness; opened eyes, looked at nurse, and mumbled. This RN explained importance of keeping oxygen on. Oxygen now 90% and HR 76; normal color returned to face and lips. Will stay in room with patient until primary RN returns.

## 2020-01-22 NOTE — Progress Notes (Signed)
Interpreter used to communicate his needs and assess. He states to interpreter, in [place], that his breathing feels "much better" since the medication was given to him, referring to Morphine that was given for pain/work of breathing. Denies any further needs besides asking for some Tea. Provided him w/ some tea. He has not had any problems w/ swallowing/ no s/s/ of aspiration risks w/ meds/water. Remains 1:1 w/ this nurse at bedside continuing to monitor.

## 2020-01-22 NOTE — Progress Notes (Signed)
Continues to have intermittent decreased O2 sats 68-70s and taking a few minutes to recover. Labored breathing noted. Remains on Heparin Gtt per order/see EMAR. Restlessness/anxiousness intermittently. He remains on 50L/100%. RT notified. MD notified. RT at bedside. Gave IV morphine per order.  Sats now 82% and he appears calm and no longer restless at this time. Continuing 1:1 monitoring.

## 2020-01-22 NOTE — Progress Notes (Signed)
PROGRESS NOTE    Christopher Travis  TWS:[id number] DOB: 09-25-1946 DOA: 01/19/2020 PCP: System, Pcp Not In   Brief Narrative:  Christopher Travis is an 74 y.o.  Hispanic male PMHx Parkinson's disease and non-Hodgkin's lymphoma in remission   who developed subjective fever malaise about 1 week prior to admission, started developing shortness of breath about 2 to 3 days ago at home his sats began reading 80% presented to [facility] found to be hypoxic,  Tachypneic chest x-ray showed bilateral infiltrates, SARS-CoV-2 PCR, his creatinine was 1.3 (up from baseline 1.0) elevated bili Ruben with a leukocytosis lactic acid of 4.3 procalcitonin 2.5 D-dimer 1.28 blood cultures ordered was fluid resuscitated started on IV Decadron on remdesivir.   Subjective: Last 24 hours afebrile, A/O x1 (did not know where, when, why).  Did know he was in the hospital.  Was able to state that he has Parkinson's disease and has good and bad days, stated today was a bad day.  Sitting in chair comfortably on HHFNC   Assessment & Plan:   Principal Problem:   Pneumonia due to COVID-19 virus Active Problems:   Parkinson disease ([place])   Hypertension   AKI (acute kidney injury) ([place])   Severe sepsis ([place])   Hyperbilirubinemia   Acute respiratory failure with hypoxia ([place])  Covid pneumonia/Acute Respiratory Failure with hypoxia/CAP COVID-19 Labs  Recent Labs    01/20/20 0134 01/21/20 0544 01/22/20 0249  DDIMER 4.67* >20.00* >20.00*  FERRITIN 1,478* 1,930* 1,890*  CRP 28.9* 34.1* 22.7*    2/12 SARS coronavirus 2 positive  Covid pneumonia/acute respiratory failure with hypoxia/CAP -Covid convalescent plasma x2 doses -Decadron 6 mg daily -Remdesivir per pharmacy protocol -Actemra x1 dose -Family would like the current treatment to be maintained and not to escalate his care, they did agree to IV morphine to help with the respiration. -See goals of care  Sepsis -Unknown source CAP vs bacteremia vs UTI vs  unk source vs secondary to acute DVT -Leukocytosis with negative fever, bands or left shift -Patient has been started on empiric antibiotics for CAP we will continue as we await results of cultures. -2/17 PCXR pending  Acute LEFT lower extremity DVT/Acute RIGHT lower extremity SVT -Treatment dose Lovenox -Unable to verify if patient has PE spoke with daughter who confirms has true iodine allergy.  Essential hypertension: -Holding antihypertensive medication due to the sepsis picture. -Patient has had multiple episode of tachycardia/bradycardia.  Sick sinus syndrome?  Will obtain echocardiogram -Depending upon findings on echocardiogram may need to consult EP/cardiology  Sick sinus syndrome? -See HTN  Acute kidney injury: Recent Labs  Lab 01/18/20 0555 01/19/20 0225 01/20/20 0134 01/21/20 0544 01/22/20 0249  CREATININE 1.05 1.12 1.00 1.25* 2.15*  -Sodium bicarb x2 Amps -0.45% saline 75m/hr  Hyperbilirubinemia Abdominal exam is benign, now resolved.  Parkinson's disease -Continue home medication.     Goals of care/ethics: I had a long discussion with the family the patient on 01/19/2020, expressed that he did not want to be intubated or resuscitated this was expressed to the family.  I have expressed to the family my concerns about his extremely poor prognosis they would like to talk it over and give it a few more hours to see if he turns around to have explained to him that it is unlikely but we would do as they wish.  We will call him again this afternoon. -2/17 consult palliative care; patient with extremely poor prognosis family does not want to escalate care.  Comfort care  vs home hospice vs residential hospice   DVT prophylaxis: Lovenox treatment dose Code Status: DNR Family Communication: 2/17 Spoke [name] (Daughter) counseled on plan of care, answered all questions Disposition Plan:    Consultants:     Procedures/Significant Events:  2/16 bilateral  lower extremity Doppler;RIGHT: Positive acute SVT involving the  right great saphenous vein, and right varicosities or other superficial veins.  LEFT: Positive acute DVT involving the left gastrocnemius veins.      I have personally reviewed and interpreted all radiology studies and my findings are as above.  VENTILATOR SETTINGS: HHFNC+NRB 2/17 FiO2 100% Flow; 60 L/min SPO2; 92%   Cultures 2/12 blood 1/2 positive coag negative staph most likely contaminant 2/12 SARS coronavirus 2 positive 2/17 sputum pending 2/17 urine pending 2/17 Blood pending    Antimicrobials: Anti-infectives (From admission, onward)   Start     Dose/Rate Stop   01/18/20 1000  remdesivir 100 mg in sodium chloride 0.9 % 100 mL IVPB     100 mg 200 mL/hr over 30 Minutes 01/21/20 1106   01/18/20 0400  azithromycin (ZITHROMAX) 500 mg in sodium chloride 0.9 % 250 mL IVPB     500 mg 250 mL/hr over 60 Minutes 01/22/20 0422   01/18/20 0300  cefTRIAXone (ROCEPHIN) 2 g in sodium chloride 0.9 % 100 mL IVPB     2 g 200 mL/hr over 30 Minutes 01/22/20 0317   01/16/2020 2045  remdesivir 100 mg in sodium chloride 0.9 % 100 mL IVPB     100 mg 200 mL/hr over 30 Minutes 02/02/2020 2232       Devices   LINES      Continuous Infusions: . sodium chloride       Objective: Vitals:   01/22/20 0713 01/22/20 0714 01/22/20 0755 01/22/20 0800  BP:    121/73  Pulse: 71 73 74 73  Resp: (!) 25 (!) 28 (!) 29 (!) 28  Temp:   97.7 F (36.5 C) 97.7 F (36.5 C)  TempSrc:   Axillary Axillary  SpO2: (!) 89% 90% (!) 88% 92%  Weight:      Height:        Intake/Output Summary (Last 24 hours) at 01/22/2020 0935 Last data filed at 01/22/2020 0854 Gross per 24 hour  Intake 2494.97 ml  Output 700 ml  Net 1794.97 ml   Filed Weights   01/27/2020 2027 01/18/20 0400 01/19/20 0419  Weight: 88.9 kg 85.8 kg 85.9 kg    Examination:  General: A/O x1, (does not know when, why, where) positive acute respiratory distress Eyes:  negative scleral hemorrhage, negative anisocoria, negative icterus ENT: Negative Runny nose, negative gingival bleeding, Neck:  Negative scars, masses, torticollis, lymphadenopathy, JVD Lungs: Tachypneic, decreased breath sounds bilaterally, positive crackles Cardiovascular: Tachycardic, without murmur gallop or rub normal S1 and S2 Abdomen: negative abdominal pain, nondistended, positive soft, bowel sounds, no rebound, no ascites, no appreciable mass Extremities: No significant cyanosis, clubbing, or edema bilateral lower extremities Skin: Negative rashes, lesions, ulcers Psychiatric:  Negative depression, negative anxiety, negative fatigue, negative mania  Central nervous system:  Cranial nerves II through XII intact, tongue/uvula midline, all extremities muscle strength 5/5, sensation intact throughout, negative dysarthria, negative expressive aphasia, negative receptive aphasia.  .     Data Reviewed: Care during the described time interval was provided by me .  I have reviewed this patient's available data, including medical history, events of note, physical examination, and all test results as part of my evaluation.   CBC: Recent  Labs  Lab 01/18/20 0555 01/18/20 0555 01/19/20 0225 01/20/20 0134 01/20/20 0535 01/21/20 0544 01/22/20 0249  WBC 10.2  --  18.3* 21.4*  --  34.3* 29.6*  NEUTROABS 9.4*  --  17.0* 20.2*  --  31.8* 27.9*  HGB 14.3   < > 13.6 14.6 15.0 17.5* 16.1  HCT 41.1   < > 40.1 42.3 44.0 51.8 47.6  MCV 92.4  --  92.8 92.6  --  94.0 93.9  PLT 137*  --  172 203  --  251 PLATELET CLUMPS NOTED ON SMEAR, COUNT APPEARS DECREASED   < > = values in this interval not displayed.   Basic Metabolic Panel: Recent Labs  Lab 01/18/20 0555 01/18/20 0555 01/19/20 0225 01/20/20 0134 01/20/20 0535 01/21/20 0544 01/22/20 0249  NA 137   < > 139 143 144 143 147*  K 4.1   < > 3.6 3.9 3.8 3.8 4.8  CL 105  --  106 109  --  106 111  CO2 21*  --  20* 21*  --  15* 16*  GLUCOSE  191*  --  207* 190*  --  246* 211*  BUN 32*  --  35* 38*  --  55* 90*  CREATININE 1.05  --  1.12 1.00  --  1.25* 2.15*  CALCIUM 8.4*  --  8.5* 8.8*  --  8.4* 8.4*  MG  --   --  1.8  --   --   --   --    < > = values in this interval not displayed.   GFR: Estimated Creatinine Clearance: 31.6 mL/min (A) (by C-G formula based on SCr of 2.15 mg/dL (H)). Liver Function Tests: Recent Labs  Lab 01/18/20 0555 01/19/20 0225 01/20/20 0134 01/21/20 0544 01/22/20 0249  AST 32 45* 57* 66* 60*  ALT 8 10 9 14 10   ALKPHOS 147* 143* 147* 195* 230*  BILITOT 1.0 1.2 0.7 1.2 1.6*  PROT 6.8 6.5 6.7 7.2 6.3*  ALBUMIN 3.3* 2.9* 3.3* 3.1* 2.9*   No results for input(s): LIPASE, AMYLASE in the last 168 hours. No results for input(s): AMMONIA in the last 168 hours. Coagulation Profile: No results for input(s): INR, PROTIME in the last 168 hours. Cardiac Enzymes: No results for input(s): CKTOTAL, CKMB, CKMBINDEX, TROPONINI in the last 168 hours. BNP (last 3 results) No results for input(s): PROBNP in the last 8760 hours. HbA1C: No results for input(s): HGBA1C in the last 72 hours. CBG: Recent Labs  Lab 01/21/20 0728 01/21/20 1213 01/21/20 1634 01/21/20 1927 01/22/20 0720  GLUCAP 215* 167* 201* 218* 184*   Lipid Profile: No results for input(s): CHOL, HDL, LDLCALC, TRIG, CHOLHDL, LDLDIRECT in the last 72 hours. Thyroid Function Tests: No results for input(s): TSH, T4TOTAL, FREET4, T3FREE, THYROIDAB in the last 72 hours. Anemia Panel: Recent Labs    01/21/20 0544 01/22/20 0249  FERRITIN 1,930* 1,890*   Urine analysis: No results found for: COLORURINE, APPEARANCEUR, LABSPEC, PHURINE, GLUCOSEU, HGBUR, BILIRUBINUR, KETONESUR, PROTEINUR, UROBILINOGEN, NITRITE, LEUKOCYTESUR Sepsis Labs: @LABRCNTIP (procalcitonin:4,lacticidven:4)  ) Recent Results (from the past 240 hour(s))  Blood culture (routine x 2)     Status: Abnormal   Collection Time: 01/23/2020  7:25 PM   Specimen: BLOOD  Result  Value Ref Range Status   Specimen Description   Final    BLOOD BLOOD LEFT FOREARM Performed at [facility], [address] [place], [place] [address]    Special Requests   Final    BOTTLES DRAWN  AEROBIC AND ANAEROBIC Blood Culture adequate volume Performed at [facility], [address] [place], [place] [address]    Culture  Setup Time   Final    GRAM POSITIVE COCCI AEROBIC BOTTLE ONLY CRITICAL RESULT CALLED TO, READ BACK BY AND VERIFIED WITH: PHARMD MAY BALL @1501  01/18/20 AKT    Culture (A)  Final    STAPHYLOCOCCUS SPECIES (COAGULASE NEGATIVE) THE SIGNIFICANCE OF ISOLATING THIS ORGANISM FROM A SINGLE SET OF BLOOD CULTURES WHEN MULTIPLE SETS ARE DRAWN IS UNCERTAIN. PLEASE NOTIFY THE MICROBIOLOGY DEPARTMENT WITHIN ONE WEEK IF SPECIATION AND SENSITIVITIES ARE REQUIRED. Performed at [facility], [place] [address], [place], [place] [address]    Report Status 01/19/2020 FINAL  Final  Blood culture (routine x 2)     Status: None   Collection Time: 01/09/2020  7:54 PM   Specimen: BLOOD  Result Value Ref Range Status   Specimen Description   Final    BLOOD RIGHT ANTECUBITAL Performed at [facility], [address] [place], [place] [address]    Special Requests   Final    BOTTLES DRAWN AEROBIC AND ANAEROBIC Blood Culture adequate volume Performed at [facility], [address] [place], [place] [address]    Culture   Final    NO GROWTH 5 DAYS Performed at [facility], [place] [address], [place], [place] [address]    Report Status 01/22/2020 FINAL  Final  SARS Coronavirus 2 Ag (30 min TAT) - Nasal Swab (BD Veritor Kit)     Status: Abnormal   Collection Time: 01/13/2020  7:59 PM   Specimen: Nasal Swab (BD Veritor Kit)  Result Value Ref Range Status   SARS Coronavirus 2 Ag POSITIVE (A) NEGATIVE Final    Comment: RESULT CALLED TO, READ BACK BY AND VERIFIED WITH: KELLY NEAL RN @2034  01/15/2020 OLSONM (NOTE) SARS-CoV-2 antigen  PRESENT. Positive results indicate the presence of viral antigens, but clinical correlation with patient history and other diagnostic information is necessary to determine patient infection status.  Positive results do not rule out bacterial infection or co-infection  with other viruses. False positive results are rare but can occur, and confirmatory RT-PCR testing may be appropriate in some circumstances. The expected result is Negative. Fact Sheet for Patients: PodPark.tn Fact Sheet for Providers: GiftContent.is  This test is not yet approved or cleared by the [place] FDA and  has been authorized for detection and/or diagnosis of SARS-CoV-2 by FDA under an Emergency Use Authorization (EUA).  This EUA will remain in effect (meaning this test can be used) for the duration of  the COVID-19 decla ration under Section 564(b)(1) of the Act, 21 U.S.C. section 360bbb-3(b)(1), unless the authorization is terminated or revoked sooner. Performed at [facility], [address] [place], [place] [address]   MRSA PCR Screening     Status: None   Collection Time: 01/20/20  9:40 AM   Specimen: Nasopharyngeal  Result Value Ref Range Status   MRSA by PCR NEGATIVE NEGATIVE Final    Comment:        The GeneXpert MRSA Assay (FDA approved for NASAL specimens only), is one component of a comprehensive MRSA colonization surveillance program. It is not intended to diagnose MRSA infection nor to guide or monitor treatment for MRSA infections. Performed at [facility], [place] [address] [place], [place] [address]          Radiology Studies: VAS [place]  LOWER EXTREMITY VENOUS (DVT)  Result Date: 01/21/2020  Lower Venous DVTStudy Indications: Elevated D dimer.  Performing Technologist: Antonieta Pert RDMS, RVT  Examination Guidelines: A complete evaluation includes B-mode imaging, spectral Doppler, color  Doppler, and power Doppler as needed of all accessible portions of each vessel. Bilateral testing is considered an integral part of a complete examination. Limited examinations for reoccurring indications may be performed as noted. The reflux portion of the exam is performed with the patient in reverse Trendelenburg.  +-------------+---------------+---------+-----------+-------------+------------+ RIGHT        CompressibilityPhasicitySpontaneityProperties   Thrombus                                                                  Aging        +-------------+---------------+---------+-----------+-------------+------------+ CFV          Full           Yes      Yes                                  +-------------+---------------+---------+-----------+-------------+------------+ SFJ          Full                                                         +-------------+---------------+---------+-----------+-------------+------------+ FV Prox      Full                                                         +-------------+---------------+---------+-----------+-------------+------------+ FV Mid       Full                                                         +-------------+---------------+---------+-----------+-------------+------------+ FV Distal    Full                                                         +-------------+---------------+---------+-----------+-------------+------------+ PFV          Full                                                         +-------------+---------------+---------+-----------+-------------+------------+ POP          Full           Yes      Yes                                  +-------------+---------------+---------+-----------+-------------+------------+  PTV          Full                                                         +-------------+---------------+---------+-----------+-------------+------------+ PERO          Full                                                         +-------------+---------------+---------+-----------+-------------+------------+ GSV          Partial                                                      +-------------+---------------+---------+-----------+-------------+------------+ varicosititesNone                               softly       Acute                                                        echogenic                 +-------------+---------------+---------+-----------+-------------+------------+   +---------+---------------+---------+-----------+----------+--------------+ LEFT     CompressibilityPhasicitySpontaneityPropertiesThrombus Aging +---------+---------------+---------+-----------+----------+--------------+ CFV      Full           Yes      Yes                                 +---------+---------------+---------+-----------+----------+--------------+ SFJ      Full                                                        +---------+---------------+---------+-----------+----------+--------------+ FV Prox  Full                                                        +---------+---------------+---------+-----------+----------+--------------+ FV Mid   Full                                                        +---------+---------------+---------+-----------+----------+--------------+ FV DistalFull                                                        +---------+---------------+---------+-----------+----------+--------------+  PFV      Full                                                        +---------+---------------+---------+-----------+----------+--------------+ POP      Full           Yes      Yes                                 +---------+---------------+---------+-----------+----------+--------------+ PTV      Full                                                         +---------+---------------+---------+-----------+----------+--------------+ PERO     Full                                                        +---------+---------------+---------+-----------+----------+--------------+ Gastroc  Partial                                      Acute          +---------+---------------+---------+-----------+----------+--------------+ GSV      Full                                                        +---------+---------------+---------+-----------+----------+--------------+     Summary: RIGHT: - Findings consistent with acute superficial vein thrombosis involving the right great saphenous vein, and right varicosities or other superficial veins. - No cystic structure found in the popliteal fossa.  LEFT: - Findings consistent with acute deep vein thrombosis involving the left gastrocnemius veins. - No cystic structure found in the popliteal fossa.  *See table(s) above for measurements and observations. Electronically signed by Deitra Mayo MD on 01/21/2020 at 4:12:51 PM.    Final         Scheduled Meds: . carbidopa-levodopa  2 tablet Oral QHS  . carbidopa-levodopa  2 tablet Oral 3 times per day   And  . carbidopa-levodopa  1 tablet Oral QHS  . chlorhexidine  15 mL Mouth Rinse BID  . Chlorhexidine Gluconate Cloth  6 each Topical Daily  . dexamethasone (DECADRON) injection  6 mg Intravenous Q24H  . enoxaparin (LOVENOX) injection  1 mg/kg Subcutaneous Q12H  . feeding supplement (ENSURE ENLIVE)  237 mL Oral TID BM  . insulin aspart  0-9 Units Subcutaneous TID WC  . mouth rinse  15 mL Mouth Rinse q12n4p  . metoprolol tartrate  50 mg Oral BID  . rOPINIRole  3 mg Oral Q24H  . sodium chloride flush  3 mL Intravenous Q12H  . sodium chloride flush  3 mL Intravenous Q12H   Continuous Infusions: . sodium chloride  LOS: 4 days   The patient is critically ill with multiple organ systems failure and requires high complexity decision making  for assessment and support, frequent evaluation and titration of therapies, application of advanced monitoring technologies and extensive interpretation of multiple databases. Critical Care Time devoted to patient care services described in this note  Time spent: 40 minutes     Marios Gaiser, Geraldo Docker, MD [facility] Pager [phone]  If 7PM-7AM, please contact night-coverage [email or web address] Password [facility] 01/22/2020, 9:35 AM

## 2020-01-22 NOTE — Plan of Care (Signed)
Received call from nursing that pt is starting to wheeze. Remains on heated high flow. Will stop IVF for now. Defer to rounding team to decide if pt needs continued IVF hydration after AM labs are resulted.

## 2020-01-22 NOTE — Progress Notes (Signed)
Called to bedside by primary RN/float RN for assistance with patient. Pt reportedly self-removed HHFNC and NRB mask, desatting to 22s. Pt had bradycardic episode rate in 40s followed by SVT rate in 170s. After reapplication of HHFNC, pt oxygen saturations recovered to 95-98% on 50L/100% and heart rate returned to 70s, sinus rhythm. Untied mitts were applied to both hands for patient safety. Pt was reminded to avoid removing his oxygen mask and ; all needs and concerns at this time met. Discussed plan to continue closely monitoring patient's compliance with medical equipment. Pt DNR at this time.

## 2020-01-22 NOTE — Progress Notes (Addendum)
ANTICOAGULATION CONSULT NOTE - Initial Consult  Pharmacy Consult for IV Heparin (changing from Lovenox) Indication: DVT  Allergies  Allergen Reactions  . Iodine Swelling    Patient Measurements: Height: 5\' 11"  (180.3 cm) Weight: 189 lb 6 oz (85.9 kg) IBW/kg (Calculated) : 75.3  Heparin dosing weight: 85.9 kg  Vital Signs: Temp: 97.7 F (36.5 C) (02/17 1300) Temp Source: Axillary (02/17 1300) BP: 93/60 (02/17 1354) Pulse Rate: 68 (02/17 1354)  Labs: Recent Labs    01/20/20 0134 01/20/20 0134 01/20/20 0535 01/20/20 0535 01/21/20 0544 01/21/20 0753 01/22/20 0249  HGB 14.6   < > 15.0   < > 17.5*  --  16.1  HCT 42.3   < > 44.0  --  51.8  --  47.6  PLT 203  --   --   --  251  --  PLATELET CLUMPS NOTED ON SMEAR, COUNT APPEARS DECREASED  CREATININE 1.00  --   --   --  1.25*  --  2.15*  TROPONINIHS  --   --   --   --  21* 24*  --    < > = values in this interval not displayed.    Estimated Creatinine Clearance: 31.6 mL/min (A) (by C-G formula based on SCr of 2.15 mg/dL (H)).   Medical History: Past Medical History:  Diagnosis Date  . Hypertension   . Non Hodgkin's lymphoma ([place])   . Parkinson disease ([place])     Medications:  Medications Prior to Admission  Medication Sig Dispense Refill Last Dose  . amLODipine-valsartan (EXFORGE) 5-320 MG tablet Take 1 tablet by mouth daily.     . carbidopa-levodopa (SINEMET CR) 50-200 MG tablet Take 2 tablets by mouth daily at 10 pm.     . carbidopa-levodopa (SINEMET IR) 25-100 MG tablet Take 1-2 tablets by mouth See admin instructions. Take 2 tablets by mouth at 0700, 2 tablets by mouth at noon, take 2 tablets by mouth at 1700, & take 1 tablet at 2200     . Cholecalciferol (VITAMIN D3 PO) Take 1 tablet by mouth daily.     . Cyanocobalamin (VITAMIN B-12 PO) Take 1 tablet by mouth daily.     Marland Kitchen rOPINIRole (REQUIP) 3 MG tablet Take 3 mg by mouth daily. (0700)       Assessment: Christopher Travis with COVID-19 pneumonia. Dopplers revealed an  acute L-DVT and R-superficial thrombosis. Pharmacy consulted for Lovenox on 2/16, but patient now with worsening renal function. Discussed with Dr. Sherral Hammers - Will transition to IV Heparin at this time.   CBC reviewed and stable. SCr up significantly from 1.25 to 2.15 in 24 hours. D-dimer >20.  Last dose of Lovenox was at 04:04 AM. Will start at 12 hour mark without a bolus and monitor by levels.   Goal of Therapy:  Heparin level 0.3-0.7 units/ml Monitor platelets by anticoagulation protocol: Yes   Plan:  -Discontinue Lovenox. -Start IV Heparin (No bolus) at 1600 PM at 1400 units/hr.  -Heparin level in 8 hours (caution interpreting with recent Lovenox).  -Daily Heparin level and CBC.    Sloan Leiter, PharmD, BCPS, BCCCP Clinical Pharmacist Please refer to [facility] for [place] numbers 01/22/2020, 3:35 PM

## 2020-01-22 NOTE — Progress Notes (Signed)
Physical Therapy Treatment Patient Details Name: Christopher Travis MRN: [id number] DOB: [birth year] Today's Date: 01/22/2020    History of Present Illness  74 y.o. male with medical history significant for Parkinson disease and non-Hodgkin lymphoma in remission, who developed subjective fevers and malaise roughly a week ago after a family member with asymptomatic COVID visited him. He went on to develop SOB 2-3 days ago, was monitoring oxygen saturations at home, began to see readings in 80's, and [facility] that reasonFormally dx with acute respiratory failure with hypoxia secondary to COIVD 19 PNA and possibly CAP, sepsis, AKI, hyperbilirubinema.      PT Comments    Pt did well with this session, he did c/o fatigue towards end but was able to complete all tasks given. Worked on standing and marching place, walking forwards and backwards as lines would allow and seated breathing exercises. Pt was on 15L via HFNC and 15L/NRB, sats at rest in low 90s with activity noted desat to min 81% Pt needed cues for pursed lip breathing between activities. Pt also completed flutter valve and incentive spirometer use.     Follow Up Recommendations  Supervision/Assistance - 24 hour     Equipment Recommendations  None recommended by PT    Recommendations for Other Services       Precautions / Restrictions Precautions Precautions: Fall;Other (comment) Precaution Comments: Parkinson's with tremors, high amounts of O2 Restrictions Weight Bearing Restrictions: No    Mobility  Bed Mobility               General bed mobility comments: pt found sitting in recliner  Transfers Overall transfer level: Needs assistance Equipment used: Rolling walker (2 wheeled) Transfers: Sit to/from Stand Sit to Stand: Mod assist         General transfer comment: able to stand from recliner multiple times with mod a and RW  Ambulation/Gait             General Gait Details: able to march in place  at recliner and also able to take 3 steps forwards and 3 steps back x 2 with RW and min a   Chief Strategy Officer    Modified Rankin (Stroke Patients Only)       Balance Overall balance assessment: Needs assistance Sitting-balance support: Feet unsupported Sitting balance-Leahy Scale: Fair     Standing balance support: During functional activity;Bilateral upper extremity supported Standing balance-Leahy Scale: Fair                              Cognition Arousal/Alertness: Charity fundraiser During Therapy: WFL for tasks assessed/performed Overall Cognitive Status: No family/caregiver present to determine baseline cognitive functioning                                 General Comments: difficult because even with interpreter pt still on NRB and with bag makes it very difficult to hear what he is saying and for him to hear what's been said to him      Exercises Other Exercises Other Exercises: flutter valve x 5 Other Exercises: incentive spiromter x 5    General Comments        Pertinent Vitals/Pain Pain Assessment: Faces Faces Pain Scale: Hurts a little bit Pain Location: w. increased work of breathing Pain Intervention(s): Limited activity within  patient's tolerance;Monitored during session    Home Living                      Prior Function            PT Goals (current goals can now be found in the care plan section) Acute Rehab PT Goals Patient Stated Goal: did not voice any goals at this time PT Goal Formulation: With patient Time For Goal Achievement: 02/01/20 Potential to Achieve Goals: Fair Progress towards PT goals: Not progressing toward goals - comment(inconsistently participates)    Frequency    Min 3X/week      PT Plan Discharge plan needs to be updated    Co-evaluation              AM-PAC PT "6 Clicks" Mobility   Outcome Measure  Help needed turning from your back to  your side while in a flat bed without using bedrails?: A Little Help needed moving from lying on your back to sitting on the side of a flat bed without using bedrails?: A Little Help needed moving to and from a bed to a chair (including a wheelchair)?: A Little Help needed standing up from a chair using your arms (e.g., wheelchair or bedside chair)?: A Lot Help needed to walk in hospital room?: A Little Help needed climbing 3-5 steps with a railing? : A Lot 6 Click Score: 16    End of Session Equipment Utilized During Treatment: Oxygen Activity Tolerance: Treatment limited secondary to medical complications (Comment);Patient limited by fatigue;Patient limited by lethargy Patient left: in chair;with call bell/phone within reach;with chair alarm set;with nursing/sitter in room Nurse Communication: Mobility status;Other (comment)(nurse in room most of tx session) PT Visit Diagnosis: Unsteadiness on feet (R26.81);Other abnormalities of gait and mobility (R26.89);Muscle weakness (generalized) (M62.81)     Time: [id number] PT Time Calculation (min) (ACUTE ONLY): 40 min  Charges:  $Therapeutic Exercise: 8-22 mins $Therapeutic Activity: 8-22 mins $Self Care/Home Management: [place], PT    Delford Field 01/22/2020, 1:35 PM

## 2020-01-23 ENCOUNTER — Inpatient Hospital Stay (HOSPITAL_COMMUNITY): Payer: Medicare Other

## 2020-01-23 DIAGNOSIS — Z515 Encounter for palliative care: Secondary | ICD-10-CM

## 2020-01-23 DIAGNOSIS — I82811 Embolism and thrombosis of superficial veins of right lower extremities: Secondary | ICD-10-CM

## 2020-01-23 DIAGNOSIS — R9431 Abnormal electrocardiogram [ECG] [EKG]: Secondary | ICD-10-CM

## 2020-01-23 DIAGNOSIS — I82402 Acute embolism and thrombosis of unspecified deep veins of left lower extremity: Secondary | ICD-10-CM

## 2020-01-23 DIAGNOSIS — G20 Parkinson's disease: Secondary | ICD-10-CM

## 2020-01-23 LAB — GLUCOSE, CAPILLARY
Glucose-Capillary: 194 mg/dL — ABNORMAL HIGH (ref 70–99)
Glucose-Capillary: 247 mg/dL — ABNORMAL HIGH (ref 70–99)
Glucose-Capillary: 168 mg/dL — ABNORMAL HIGH (ref 70–99)
Glucose-Capillary: 157 mg/dL — ABNORMAL HIGH (ref 70–99)

## 2020-01-23 LAB — BASIC METABOLIC PANEL
Anion gap: 20 — ABNORMAL HIGH (ref 5–15)
BUN: 150 mg/dL — ABNORMAL HIGH (ref 8–23)
CO2: 21 mmol/L — ABNORMAL LOW (ref 22–32)
Calcium: 8.1 mg/dL — ABNORMAL LOW (ref 8.9–10.3)
Chloride: 111 mmol/L (ref 98–111)
Creatinine, Ser: 3.44 mg/dL — ABNORMAL HIGH (ref 0.61–1.24)
GFR calc Af Amer: 19 mL/min — ABNORMAL LOW (ref >60)
GFR calc non Af Amer: 16 mL/min — ABNORMAL LOW (ref >60)
Glucose, Bld: 195 mg/dL — ABNORMAL HIGH (ref 70–99)
Potassium: 4.1 mmol/L (ref 3.5–5.1)
Sodium: 152 mmol/L — ABNORMAL HIGH (ref 135–145)

## 2020-01-23 LAB — COMPREHENSIVE METABOLIC PANEL
ALT: 7 U/L (ref 0–44)
AST: 58 U/L — ABNORMAL HIGH (ref 15–41)
Albumin: 3 g/dL — ABNORMAL LOW (ref 3.5–5.0)
Alkaline Phosphatase: 209 U/L — ABNORMAL HIGH (ref 38–126)
Anion gap: 18 — ABNORMAL HIGH (ref 5–15)
BUN: 114 mg/dL — ABNORMAL HIGH (ref 8–23)
CO2: 22 mmol/L (ref 22–32)
Calcium: 8.4 mg/dL — ABNORMAL LOW (ref 8.9–10.3)
Chloride: 108 mmol/L (ref 98–111)
Creatinine, Ser: 2.69 mg/dL — ABNORMAL HIGH (ref 0.61–1.24)
GFR calc Af Amer: 26 mL/min — ABNORMAL LOW (ref >60)
GFR calc non Af Amer: 22 mL/min — ABNORMAL LOW (ref >60)
Glucose, Bld: 205 mg/dL — ABNORMAL HIGH (ref 70–99)
Potassium: 4.6 mmol/L (ref 3.5–5.1)
Sodium: 148 mmol/L — ABNORMAL HIGH (ref 135–145)
Total Bilirubin: 1.6 mg/dL — ABNORMAL HIGH (ref 0.3–1.2)
Total Protein: 6.1 g/dL — ABNORMAL LOW (ref 6.5–8.1)

## 2020-01-23 LAB — CBC WITH DIFFERENTIAL/PLATELET
Abs Immature Granulocytes: 1.07 10*3/uL — ABNORMAL HIGH (ref 0.00–0.07)
Basophils Absolute: 0.2 10*3/uL — ABNORMAL HIGH (ref 0.0–0.1)
Basophils Relative: 1 %
Eosinophils Absolute: 0 10*3/uL (ref 0.0–0.5)
Eosinophils Relative: 0 %
HCT: 48 % (ref 39.0–52.0)
Hemoglobin: 16.5 g/dL (ref 13.0–17.0)
Immature Granulocytes: 3 %
Lymphocytes Relative: 2 %
Lymphs Abs: 0.7 10*3/uL (ref 0.7–4.0)
MCH: 32 pg (ref 26.0–34.0)
MCHC: 34.4 g/dL (ref 30.0–36.0)
MCV: 93.2 fL (ref 80.0–100.0)
Monocytes Absolute: 0.8 10*3/uL (ref 0.1–1.0)
Monocytes Relative: 3 %
Neutro Abs: 28.8 10*3/uL — ABNORMAL HIGH (ref 1.7–7.7)
Neutrophils Relative %: 91 %
Platelets: 125 10*3/uL — ABNORMAL LOW (ref 150–400)
RBC: 5.15 MIL/uL (ref 4.22–5.81)
RDW: 14.8 % (ref 11.5–15.5)
WBC: 31.6 10*3/uL — ABNORMAL HIGH (ref 4.0–10.5)
nRBC: 0.1 % (ref 0.0–0.2)

## 2020-01-23 LAB — ECHOCARDIOGRAM COMPLETE
Height: 71 in
Weight: 3030 oz

## 2020-01-23 LAB — HEPARIN LEVEL (UNFRACTIONATED)
Heparin Unfractionated: 0.51 IU/mL (ref 0.30–0.70)
Heparin Unfractionated: 0.67 IU/mL (ref 0.30–0.70)

## 2020-01-23 LAB — FERRITIN: Ferritin: 1936 ng/mL — ABNORMAL HIGH (ref 24–336)

## 2020-01-23 LAB — MRSA PCR SCREENING: MRSA by PCR: NEGATIVE

## 2020-01-23 LAB — C-REACTIVE PROTEIN: CRP: 15.2 mg/dL — ABNORMAL HIGH (ref <1.0)

## 2020-01-23 LAB — MAGNESIUM
Magnesium: 2.6 mg/dL — ABNORMAL HIGH (ref 1.7–2.4)
Magnesium: 2.7 mg/dL — ABNORMAL HIGH (ref 1.7–2.4)

## 2020-01-23 LAB — PHOSPHORUS: Phosphorus: 4.7 mg/dL — ABNORMAL HIGH (ref 2.5–4.6)

## 2020-01-23 LAB — D-DIMER, QUANTITATIVE: D-Dimer, Quant: >20 ug/mL-FEU — ABNORMAL HIGH (ref 0.00–0.50)

## 2020-01-23 MED ORDER — LORAZEPAM 2 MG/ML IJ SOLN
1.0000 mg | INTRAMUSCULAR | Status: DC | PRN
  Administered 2020-01-23 – 2020-01-24 (×2): 2 mg via INTRAVENOUS
  Administered 2020-01-24: 07:00:00 1 mg via INTRAVENOUS
  Filled 2020-01-23 (×3): qty 1

## 2020-01-23 MED ORDER — FLUCONAZOLE IN SODIUM CHLORIDE 200-0.9 MG/100ML-% IV SOLN
200.0000 mg | Freq: Once | INTRAVENOUS | Status: AC
  Administered 2020-01-23: 200 mg via INTRAVENOUS
  Filled 2020-01-23: qty 100

## 2020-01-23 MED ORDER — SODIUM CHLORIDE 0.9 % IV SOLN
2.0000 g | INTRAVENOUS | Status: DC
  Administered 2020-01-23 – 2020-01-25 (×3): 2 g via INTRAVENOUS
  Filled 2020-01-23 (×3): qty 2

## 2020-01-23 MED ORDER — FUROSEMIDE 10 MG/ML IJ SOLN
40.0000 mg | Freq: Once | INTRAMUSCULAR | Status: AC
  Administered 2020-01-23: 40 mg via INTRAVENOUS
  Filled 2020-01-23: qty 4

## 2020-01-23 MED ORDER — MORPHINE SULFATE (PF) 2 MG/ML IV SOLN
2.0000 mg | INTRAVENOUS | Status: DC | PRN
  Administered 2020-01-23 – 2020-01-24 (×2): 2 mg via INTRAVENOUS
  Filled 2020-01-23 (×4): qty 1

## 2020-01-23 MED ORDER — FENTANYL CITRATE (PF) 100 MCG/2ML IJ SOLN
50.0000 ug | INTRAMUSCULAR | Status: DC | PRN
  Administered 2020-01-23: 15:00:00 50 ug via INTRAVENOUS

## 2020-01-23 MED ORDER — FUROSEMIDE 10 MG/ML IJ SOLN
60.0000 mg | Freq: Once | INTRAMUSCULAR | Status: AC
  Administered 2020-01-23: 60 mg via INTRAVENOUS
  Filled 2020-01-23: qty 6

## 2020-01-23 MED ORDER — FLUCONAZOLE 100MG IVPB
100.0000 mg | INTRAVENOUS | Status: AC
  Administered 2020-01-24 – 2020-01-28 (×5): 100 mg via INTRAVENOUS
  Filled 2020-01-23 (×8): qty 50

## 2020-01-23 MED ORDER — FENTANYL CITRATE (PF) 100 MCG/2ML IJ SOLN
25.0000 ug | INTRAMUSCULAR | Status: DC | PRN
  Administered 2020-01-23: 25 ug via INTRAVENOUS
  Filled 2020-01-23 (×2): qty 2

## 2020-01-23 NOTE — Consult Note (Signed)
Consultation Note Date: 01/23/2020   Patient Name: Christopher Travis  DOB: 09/24/1946  MRN: [id number]  Age / Sex: 74 y.o., male  PCP: System, Pcp Not In Referring Physician: Allie Bossier, MD  Reason for Consultation: Establishing goals of care and Psychosocial/spiritual support  HPI/Patient Profile: 74 y.o. male  with past medical history of Parkinsons and non-hodkins lymphoma (in remission) who was admitted on 01/22/2020 with multifocal pneumonia and acute renal failure secondary to COVID 19.   Reportedly the patient had been battling symptoms at home for about a week.  He had to come to the hospital as he had become hypoxic with oxygen saturations in the high 70s on room air.  The patient has had a very difficult course and is currently on 60L of HFNC with a non-rebreather mask.  He opted for DNR on 2/15 after a discussion with Dr. Aileen Fass.  Clinical Assessment and Goals of Care:  I have reviewed medical records including EPIC notes, labs and imaging, received report from the night RN as well as Dr. Sherral Hammers, and spoke on the phone with [name] the patient's daughter to introduce Palliative Medicine and to discuss diagnosis prognosis, GOC, disposition and options.  I introduced Palliative Medicine as specialized medical care for people living with serious illness. It focuses on providing relief from the symptoms and stress of a serious illness.   We discussed a brief life review of the patient. He is a very strong man who is well loved by his family.  He lived at home with his wife.  He has 3 daughters.  Per [name] the family makes decisions as a whole.    [name] was very much aware of [name]' status and has kept in close contact with the doctor.  On our 1st phone call she asked about labs.  We discussed that his kidney function was worsening and his respiratory status was not improving.  [name]' had  struggled greatly overnight As [name]' status appears to be very fragile [name] was naturally very concerned about how to improve his health.  She requested interdisciplinary support.  She questioned using ECMO and a nephrology consult.  I talked with her about her father's DNR code status.   He agreed to DNR personally.  Jerome expressed that [name]' had been told by the doctor that if he was intubated he would not be able to come off the ventilator and that is why he chose DNR.  I suggested to [name] that that statement (about intubation) was still true and that ECMO was going beyond intubation.  In response to her request about interdisciplinary support I committed to talk with Dr. Sherral Hammers about a pulmonary critical care consultation.  While I was talking with Dr. Sherral Hammers the patient became acutely worse his sats were in the 80s on 60L and they dropped into the 35s.  Dr. Sherral Hammers and I called [name] back with the intention of getting the family members to the hospital quickly out of concern that he may pass away.  [name] added her 2 sisters, mother and husband to the call.  We explained that [name] was acutely worse and that 2 of them should come to the hospital to be with him as we were concerned for end of life.  [place] wanted to talk further about code status and interdisciplinary consultations but we encouraged her to start making her way to the hospital.  While she was on the way to the hospital [name] called the Palliative office.  I returned her call.  She put a male voice on the phone who explained their concern was that [name] had not agreed with DNR and nothing had been signed.  I attempted to explain that [name] had agreed to DNR and that it wasn't necessary to sign anything - rather it was just an order placed by the doctor while in the hospital.  I was unable to give this explanation to the family because our conversation was interrupted by the hospital calling [name].        Primary Decision Maker:  PATIENT.  I do not believe a surrogate decision maker was named but per [name] the family makes decisions as a whole.    SUMMARY OF RECOMMENDATIONS    PMT will continue to follow with you and continue to attempt to support the family and advocate for Mr. Barrowman.  Code Status/Advance Care Planning:  DNR.  More discussions are likely needed with the family if Mr. Laque is unable to represent himself.   Symptom Management:   Changed Morphine to fentanyl given renal status.  Additional Recommendations (Limitations, Scope, Preferences):  Full scope treatment with the only current care limitation being DNR.  Psycho-social/Spiritual:   Desire for further Chaplaincy support: not discussed  Prognosis:  Unfortunately [name] is at very high risk for acute decline and death due to severe bilateral COVID pneumonia and a high probability of PE.    (Patient has an iodine allergy)    Discharge Planning: To Be Determined      Primary Diagnoses: Present on Admission: . Parkinson disease ([place]) . Hypertension . AKI (acute kidney injury) ([place]) . Severe sepsis ([place]) . Hyperbilirubinemia   I have reviewed the medical record, interviewed the patient and family, and examined the patient. The following aspects are pertinent.  Past Medical History:  Diagnosis Date  . Hypertension   . Non Hodgkin's lymphoma ([place])   . Parkinson disease [facility])    Social History   Socioeconomic History  . Marital status: Unknown    Spouse name: Not on file  . Number of children: Not on file  . Years of education: Not on file  . Highest education level: Not on file  Occupational History  . Not on file  Tobacco Use  . Smoking status: Never Smoker  . Smokeless tobacco: Never Used  Substance and Sexual Activity  . Alcohol use: Not Currently  . Drug use: Never  . Sexual activity: Not on file  Other Topics Concern  . Not on file  Social History Narrative  . Not  on file   Social Determinants of Health   Financial Resource Strain:   . Difficulty of Paying Living Expenses: Not on file  Food Insecurity:   . Worried About Charity fundraiser in the Last Year: Not on file  . Ran Out of Food in the Last Year: Not on file  Transportation Needs:   . Lack of Transportation (Medical): Not on file  . Lack of Transportation (Non-Medical): Not on file  Physical Activity:   . Days of Exercise per Week: Not on file  . Minutes of Exercise per Session: Not on file  Stress:   . Feeling of Stress : Not on file  Social Connections:   . Frequency of Communication with Friends and Family: Not on file  . Frequency of Social Gatherings with Friends and Family: Not on file  . Attends Religious Services: Not on file  . Active Member of Clubs or Organizations: Not on file  . Attends Archivist Meetings: Not on file  . Marital Status: Not on file   Family History  Problem Relation Age of Onset  . Liver cancer Mother    Scheduled Meds: . carbidopa-levodopa  2 tablet Oral QHS  . carbidopa-levodopa  2 tablet Oral 3 times per day   And  . carbidopa-levodopa  1 tablet Oral QHS  . chlorhexidine  15 mL Mouth Rinse BID  . Chlorhexidine Gluconate Cloth  6 each Topical Daily  . dexamethasone (DECADRON) injection  6 mg Intravenous Q24H  . feeding supplement (ENSURE ENLIVE)  237 mL Oral TID BM  . insulin aspart  0-9 Units Subcutaneous TID WC  . mouth rinse  15 mL Mouth Rinse q12n4p  . metoprolol tartrate  50 mg Oral BID  . rOPINIRole  3 mg Oral Q24H  . sodium chloride flush  3 mL Intravenous Q12H  . sodium chloride flush  3 mL Intravenous Q12H   Continuous Infusions: . sodium chloride    . ceFEPime (MAXIPIME) IV 2 g (01/23/20 0804)  . heparin 1,350 Units/hr (01/23/20 1034)   PRN Meds:.sodium chloride, acetaminophen, fentaNYL (SUBLIMAZE) injection, guaiFENesin-dextromethorphan, HYDROcodone-acetaminophen, lip balm, LORazepam, metoprolol tartrate, morphine  injection, nitroGLYCERIN, ondansetron **OR** ondansetron (ZOFRAN) IV, senna-docusate, sodium chloride flush Allergies  Allergen Reactions  . Iodine Swelling     Vital Signs: BP (!) 103/59 (BP Location: Right Arm)   Pulse 83   Temp (!) 97.5 F (36.4 C) (Axillary)   Resp (!) 22   Ht 5\' 11"  (1.803 m)   Wt 85.9 kg   SpO2 (!) 79%   BMI 26.41 kg/m  Pain Scale: 0-10 POSS *See Group Information*: 1-Acceptable,Awake and alert Pain Score: 0-No pain   SpO2: SpO2: (!) 79 % O2 Device:SpO2: (!) 79 % O2 Flow Rate: .O2 Flow Rate (L/min): 60 L/min  IO: Intake/output summary:   Intake/Output Summary (Last 24 hours) at 01/23/2020 1454 Last data filed at 01/23/2020 [id number] Gross per 24 hour  Intake 1386.54 ml  Output 775 ml  Net 611.54 ml    LBM: Last BM Date: 01/21/20 Baseline Weight: Weight: 88.9 kg Most recent weight: Weight: 85.9 kg     Palliative Assessment/Data: 30%   Discussed with Dr. Sherral Hammers, Dr. Rowe Pavy, PMT RN.  Time In: 10:30 Time Out:  11:30 Time in:  2:45 Time out 3:15 (approximately) Time Total: 90 min. Visit consisted of counseling and education dealing with the complex and emotionally intense issues surrounding the need for palliative care and symptom management in the setting of serious and potentially life-threatening illness. Greater than 50%  of this time was spent counseling and coordinating care related to the above assessment and plan.  Signed by: Florentina Jenny, PA-C Palliative Medicine  Please contact Palliative Medicine Team phone at [phone] for questions and concerns.  For individual provider: See Shea Evans

## 2020-01-23 NOTE — Progress Notes (Signed)
  RT Note:  RT called to assess patient.  Oxygen falling out of nose.  RT wrapped tubing around ears with cloth tape.  Placed cloth tape across cheeks to help hold cannula in place. Per RN patient is more relaxed and calm at this time.

## 2020-01-23 NOTE — Progress Notes (Signed)
Daughter called and provided w/ updates and states she would like to be contacted w/ updates from MD before late this evening. She talked to her Dad as well. All questions answered.

## 2020-01-23 NOTE — Progress Notes (Signed)
Has continued to have intermittent restlessness and discomfort/labored breathing, but is now resting quietly w/ eyes closed at this time. 1:1 monitoring continues. HR 59 SB, sats 80-82 on 40L/100%. Changed condom catheter and peri care completed, as well as linen changed repositioned in the bed towards his left side.

## 2020-01-23 NOTE — Progress Notes (Addendum)
Intermittent restlesness, labored breathing and discomfort. RR increase and oxygen sats decrease w/ increased restlessness. IV morphine given for pain per order/see EMAR. He is now calm w/ eyes closed and RR 15, O2 sats 80% on 50L/100% HFNC and NRB, HR 60 . He does, however wake up every few minutes and take off NRB mask then falls back to sleep. Remains 1:1 with this nurse at bedside continuously monitoring.

## 2020-01-23 NOTE — Progress Notes (Signed)
[place] for IV Heparin  Indication: DVT  Allergies  Allergen Reactions  . Iodine Swelling    Patient Measurements: Height: 5\' 11"  (180.3 cm) Weight: 189 lb 6 oz (85.9 kg) IBW/kg (Calculated) : 75.3  Heparin dosing weight: 85.9 kg  Vital Signs: Temp: 96.9 F (36.1 C) (02/17 2320) Temp Source: Axillary (02/17 2320) BP: 112/70 (02/18 0000) Pulse Rate: 65 (02/18 0130)  Labs: Recent Labs    01/21/20 0544 01/21/20 0544 01/21/20 0753 01/22/20 0249 01/22/20 2004 01/23/20 0020  HGB 17.5*   < >  --  16.1  --  16.5  HCT 51.8  --   --  47.6  --  48.0  PLT 251  --   --  PLATELET CLUMPS NOTED ON SMEAR, COUNT APPEARS DECREASED  --  125*  HEPARINUNFRC  --   --   --   --   --  0.51  CREATININE 1.25*   < >  --  2.15* 2.51* 2.69*  TROPONINIHS 21*  --  24*  --   --   --    < > = values in this interval not displayed.    Estimated Creatinine Clearance: 25.3 mL/min (A) (by C-G formula based on SCr of 2.69 mg/dL (H)).  Assessment: 37 YOM with COVID-19 pneumonia. Dopplers revealed an acute L-DVT and R-superficial thrombosis. Pharmacy consulted for Lovenox on 2/16, but patient now with worsening renal function. Discussed with Dr. Sherral Hammers - Will transition to IV Heparin at this time.   CBC reviewed and stable. SCr up significantly from 1.25 to 2.15 in 24 hours. D-dimer >20.  Last dose of Lovenox was at 04:04 AM. Will start at 12 hour mark without a bolus and monitor by levels.  Heparin level therapeutic (0.51) on gtt at 1400 units/hr. No bleeding noted.   Goal of Therapy:  Heparin level 0.3-0.7 units/ml Monitor platelets by anticoagulation protocol: Yes   Plan:  -Continue IV heparin at 1400 units/hr.  -Will f/u 6 hr confirmatory heparin level  Sherlon Handing, PharmD, BCPS Please see amion for complete clinical pharmacist phone lists 01/23/2020, 2:11 AM

## 2020-01-23 NOTE — Progress Notes (Signed)
Occupational Therapy Treatment Patient Details Name: Christopher Travis MRN: [id number] DOB: [birth year] Today's Date: 01/23/2020    History of present illness  74 y.o. male with medical history significant for Parkinson disease and non-Hodgkin lymphoma in remission, who developed subjective fevers and malaise roughly a week ago after a family member with asymptomatic COVID visited him. He went on to develop SOB 2-3 days ago, was monitoring oxygen saturations at home, began to see readings in 80's, and [facility] with acute respiratory failure with hypoxia secondary to COIVD 19 PNA and possibly CAP, sepsis, AKI, hyperbilirubinema.     OT comments  Patient supine in bed with head elevated on arrival.  Nursing present in room.  Patient on 60L O2 HHFNC and SpO2 69.  Nursing advising against mobility out of bed level.  Patient was very restless and trying to get out of bed.  Required some relaxation techniques.  Completed bed level grooming with set up.  Will continue to follow with OT acutely to address the deficits listed below.    Follow Up Recommendations  Home health OT;Supervision/Assistance - 24 hour    Equipment Recommendations  3 in 1 bedside commode    Recommendations for Other Services      Precautions / Restrictions Precautions Precautions: Fall;Other (comment) Precaution Comments: Parkinson's with tremors, high amounts of O2 Restrictions Weight Bearing Restrictions: No       Mobility Bed Mobility Overal bed mobility: Needs Assistance Bed Mobility: Supine to Sit     Supine to sit: Min assist;HOB elevated     General bed mobility comments: Did not sit EOB, only sat up in bed  Transfers Overall transfer level: Needs assistance                    Balance Overall balance assessment: Mild deficits observed, not formally tested                                         ADL either performed or assessed with clinical  judgement   ADL Overall ADL's : Needs assistance/impaired     Grooming: Wash/dry hands;Wash/dry face;Set up;Bed level                                 General ADL Comments: Unable to leave bed level due to medical complications     Vision       Perception     Praxis      Cognition Arousal/Alertness: Awake/alert Behavior During Therapy: Restless;Anxious;Impulsive Overall Cognitive Status: No family/caregiver present to determine baseline cognitive functioning                                          Exercises     Shoulder Instructions       General Comments      Pertinent Vitals/ Pain       Pain Assessment: Faces Faces Pain Scale: Hurts a little bit Pain Location: general discomfort Pain Intervention(s): Relaxation;Limited activity within patient's tolerance  Home Living  Prior Functioning/Environment              Frequency  Min 2X/week        Progress Toward Goals  OT Goals(current goals can now be found in the care plan section)  Progress towards OT goals: Not progressing toward goals - comment(Complicated by O2 requirements)  Acute Rehab OT Goals Patient Stated Goal: did not voice any goals at this time OT Goal Formulation: With patient Time For Goal Achievement: 02/03/20 Potential to Achieve Goals: Good  Plan Discharge plan remains appropriate    Co-evaluation                 AM-PAC OT "6 Clicks" Daily Activity     Outcome Measure   Help from another person eating meals?: A Little Help from another person taking care of personal grooming?: A Little Help from another person toileting, which includes using toliet, bedpan, or urinal?: A Lot Help from another person bathing (including washing, rinsing, drying)?: A Lot Help from another person to put on and taking off regular upper body clothing?: A Little Help from another person to put on and  taking off regular lower body clothing?: A Lot 6 Click Score: 15    End of Session Equipment Utilized During Treatment: Oxygen  OT Visit Diagnosis: Unsteadiness on feet (R26.81);Other abnormalities of gait and mobility (R26.89);Muscle weakness (generalized) (M62.81)   Activity Tolerance Treatment limited secondary to medical complications (Comment)(High O2 amounts and low SpO2)   Patient Left in bed;with call bell/phone within reach;with bed alarm set;with nursing/sitter in room   Nurse Communication Mobility status        Time: 1020-1035 OT Time Calculation (min): 15 min  Charges: OT General Charges $OT Visit: 1 Visit OT Treatments $Self Care/Home Management : 8-22 mins  August Luz, OTR/L    Phylliss Bob 01/23/2020, 1:50 PM

## 2020-01-23 NOTE — Progress Notes (Signed)
[place] for IV Heparin  Indication: DVT  Allergies  Allergen Reactions  . Iodine Swelling    Patient Measurements: Height: 5\' 11"  (180.3 cm) Weight: 189 lb 6 oz (85.9 kg) IBW/kg (Calculated) : 75.3  Heparin dosing weight: 85.9 kg  Vital Signs: Temp: 97.8 F (36.6 C) (02/18 0725) Temp Source: Axillary (02/18 0725) BP: 120/75 (02/18 0725) Pulse Rate: 74 (02/18 0725)  Labs: Recent Labs    01/21/20 0544 01/21/20 0544 01/21/20 0753 01/22/20 0249 01/22/20 2004 01/23/20 0020 01/23/20 0805  HGB 17.5*   < >  --  16.1  --  16.5  --   HCT 51.8  --   --  47.6  --  48.0  --   PLT 251  --   --  PLATELET CLUMPS NOTED ON SMEAR, COUNT APPEARS DECREASED  --  125*  --   HEPARINUNFRC  --   --   --   --   --  0.51 0.67  CREATININE 1.25*   < >  --  2.15* 2.51* 2.69*  --   TROPONINIHS 21*  --  24*  --   --   --   --    < > = values in this interval not displayed.    Estimated Creatinine Clearance: 25.3 mL/min (A) (by C-G formula based on SCr of 2.69 mg/dL (H)).  Assessment: 34 YOM with COVID-19 pneumonia. Dopplers revealed an acute L-DVT and R-superficial thrombosis. Pharmacy consulted for Lovenox on 2/16, but patient now with worsening renal function. Discussed with Dr. Sherral Hammers - Will transition to IV Heparin at this time.   Hgb/Hct has remained stable. Platelets have fluctuated was down to 137 on 2/13, up to 250 on 2/16, now down again - given fluctuations does not appear to be HIT but will monitor closely and discuss testing with MD given further trends.   Heparin level this morning remains therapeutic but on the higher end of the goal range (Hl 0.67 << 0.51, goal of 0.3-0.7). No bleeding or issues noted per discussion with RN - will reduce slightly to keep within range.   Goal of Therapy:  Heparin level 0.3-0.7 units/ml Monitor platelets by anticoagulation protocol: Yes   Plan:  - Reduce Heparin to 1350 units/hr (13.5 ml/hr) - Will continue  to monitor for any signs/symptoms of bleeding and will follow up with heparin level in the a.m.   Thank you for allowing pharmacy to be a part of this patient's care.  Alycia Rossetti, PharmD, BCPS Clinical Pharmacist 01/23/2020 10:21 AM   **Pharmacist phone directory can now be found on [email or web address] ([email or web address]).  Listed under [place].

## 2020-01-23 NOTE — Plan of Care (Signed)
Pt A&Ox3. VSS, SpO2 >88% on 40L HHFNC 100% NRB. Utilizing condom cath w. Minimal output, provider aware. Using faces pain scale to measure pain, no indication throughout shift. Bgs stable w/ sliding scale insulin for coverage.  Pt had episode of severe desaturation w/o ability to recover. Pt was restless & anxious, kept removing NRB from face & desatting to 50s%. Provider notified & RT came to bedside to assess. Pt given PRN fentanyl & PRN ativan at this time. Proned. Able to to recover sat. Remains drowsy/lethargic d/t meds. Family notified by MD of pts condition  Family visit completed. MD present to explain pts plan of care to daughter [name] & Pts wife.   Unable to give 1700 PO meds d/t drowsiness  All safety measures in place, will report to oncoming shift.   Tomie China    Problem: Education: Goal: Knowledge of General Education information will improve Description: Including pain rating scale, medication(s)/side effects and non-pharmacologic comfort measures Outcome: Progressing   Problem: Health Behavior/Discharge Planning: Goal: Ability to manage health-related needs will improve Outcome: Progressing   Problem: Clinical Measurements: Goal: Ability to maintain clinical measurements within normal limits will improve Outcome: Progressing Goal: Will remain free from infection Outcome: Progressing Goal: Diagnostic test results will improve Outcome: Progressing Goal: Respiratory complications will improve Outcome: Progressing Goal: Cardiovascular complication will be avoided Outcome: Progressing   Problem: Activity: Goal: Risk for activity intolerance will decrease Outcome: Progressing   Problem: Nutrition: Goal: Adequate nutrition will be maintained Outcome: Progressing   Problem: Coping: Goal: Level of anxiety will decrease Outcome: Progressing   Problem: Elimination: Goal: Will not experience complications related to bowel motility Outcome:  Progressing Goal: Will not experience complications related to urinary retention Outcome: Progressing   Problem: Pain Managment: Goal: General experience of comfort will improve Outcome: Progressing   Problem: Safety: Goal: Ability to remain free from injury will improve Outcome: Progressing   Problem: Skin Integrity: Goal: Risk for impaired skin integrity will decrease Outcome: Progressing

## 2020-01-23 NOTE — Progress Notes (Signed)
Patient transported for family visit with 15L NRB and 15L HFNC. Heated HFNC and NRB placed on patient in visitation room.  After visit, patient transferred back to #[id number] and again placed back on Heated HFNC and NRB.

## 2020-01-23 NOTE — Progress Notes (Signed)
Pharmacy Antibiotic Note  Christopher Travis is a 74 y.o. male admitted on 01/25/2020 with COVID-19 pneumonia now concerning for superimposed bacterial pneumonia.  Pharmacy has been consulted for Cefpeime dosing. Patient has persistent leukocytosis with worsening respiratory status. PCT elevated at 3.18. SCr up to 2.69  Plan: -Start Cefepime 2 gm IV Q 24 hours -Monitor CBC, cultures and clinical progress -F/u MRSA PCR   Height: 5\' 11"  (180.3 cm) Weight: 189 lb 6 oz (85.9 kg) IBW/kg (Calculated) : 75.3  Temp (24hrs), Avg:97.3 F (36.3 C), Min:96.9 F (36.1 C), Max:97.8 F (36.6 C)  Recent Labs  Lab 01/25/2020 1944 01/28/2020 2159 01/18/20 0555 01/19/20 0225 01/19/20 0225 01/20/20 0134 01/21/20 0544 01/22/20 0249 01/22/20 2004 01/23/20 0020  WBC 15.0*  --    < > 18.3*  --  21.4* 34.3* 29.6*  --  31.6*  CREATININE 1.36*  --    < > 1.12   < > 1.00 1.25* 2.15* 2.51* 2.69*  LATICACIDVEN 4.3* 1.8  --   --   --   --   --   --   --   --    < > = values in this interval not displayed.    Estimated Creatinine Clearance: 25.3 mL/min (A) (by C-G formula based on SCr of 2.69 mg/dL (H)).    Allergies  Allergen Reactions  . Iodine Swelling    Antimicrobials this admission: remdesivir 2/12>>2/16 azithromycin 2/13>> 2/17 ceftraixone 2/13>> 2/17  Dose adjustments this admission:  Microbiology results: 2/17 BCx: ngtd 2/15 MRSA PCR: neg   Thank you for allowing pharmacy to be a part of this patient's care.  Albertina Parr, PharmD., BCPS Clinical Pharmacist Clinical phone for 01/23/20 until 5pm: [phone]

## 2020-01-23 NOTE — Progress Notes (Signed)
  Echocardiogram 2D Echocardiogram has been performed.  Bobbye Charleston 01/23/2020, 9:04 AM

## 2020-01-23 NOTE — Progress Notes (Addendum)
PROGRESS NOTE    Christopher Travis  SXJ:[id number] DOB: 08-03-1946 DOA: 01/11/2020 PCP: System, Pcp Not In   Brief Narrative:  Christopher Travis is an 74 y.o.  Hispanic male PMHx Parkinson's disease and non-Hodgkin's lymphoma in remission   who developed subjective fever malaise about 1 week prior to admission, started developing shortness of breath about 2 to 3 days ago at home his sats began reading 80% presented to [facility] found to be hypoxic,  Tachypneic chest x-ray showed bilateral infiltrates, SARS-CoV-2 PCR, his creatinine was 1.3 (up from baseline 1.0) elevated bili Ruben with a leukocytosis lactic acid of 4.3 procalcitonin 2.5 D-dimer 1.28 blood cultures ordered was fluid resuscitated started on IV Decadron on remdesivir.   Subjective: 2/18 afebrile last 24 hours A/O x2 (does not know where, when).  Again reminded [place] that he has Parkinson's disease and has good days and bad days.    Assessment & Plan:   Principal Problem:   Pneumonia due to COVID-19 virus Active Problems:   Parkinson disease ([place])   Hypertension   AKI (acute kidney injury) ([place])   Severe sepsis ([place])   Hyperbilirubinemia   Acute respiratory failure with hypoxia (HCC)  Covid pneumonia/Acute Respiratory Failure with hypoxia/CAP COVID-19 Labs  Recent Labs    01/21/20 0544 01/22/20 0249 01/23/20 0020  DDIMER >20.00* >20.00* >20.00*  FERRITIN 1,930* 1,890* 1,936*  CRP 34.1* 22.7* 15.2*    2/12 SARS coronavirus 2 positive  Covid pneumonia/acute respiratory failure with hypoxia/HCAP -Covid convalescent plasma x2 doses -Decadron 6 mg daily -Remdesivir per pharmacy protocol -2/15 Actemra x1 dose -Family would like the current treatment to be maintained and not to escalate his care, they did agree to IV morphine to help with the respiration. -See goals of care -Titrate O2 to maintain SPO2 88% if possible.  Patient appears to be happy hypoxic doing well low 80s/high 70% -Prone patient 16 hours/day; if  cannot tolerate prone 2 to 3 hours per shift -2/18 ADDENDUM; paged to bedside patient SPO2 low 60s, respiratory distress, maxed out on HHFNC + NRB. Advised daughter [name] and family patient's respiratory status was deteriorating rapidly and they should come for visit.  1. Upon arrival to bedside was able to prone patient,  2.Fentanyl PRN + Ativan PRN  Patient's respiratory status stabilized, now on HHFNC FiO2 100%, flow; 60 L/min, SPO2; 90%  Severe Sepsis -Unknown source HCAP vs bacteremia vs UTI vs unk source vs secondary to acute DVT -Leukocytosis with negative fever, bands or left shift -2/17 PCXR; worsening groundglass opacification see results below -2/18 patient's respiratory status continues to deteriorate will DC CAP coverage and start HCAP coverage.  Acute LEFT lower extremity DVT/Acute RIGHT lower extremity SVT -Treatment dose Lovenox -Unable to verify if patient has PE spoke with daughter who confirms has true iodine allergy, therefore no CTA PE protocol.  Essential hypertension: -Holding antihypertensive medication due to the sepsis picture. -Patient has had multiple episode of tachycardia/bradycardia.  Sick sinus syndrome?  Will obtain echocardiogram -Depending upon findings on echocardiogram may need to consult EP/cardiology  Sick sinus syndrome? -See HTN  Acute kidney injury: Recent Labs  Lab 01/20/20 0134 01/21/20 0544 01/22/20 0249 01/22/20 2004 01/23/20 0020  CREATININE 1.00 1.25* 2.15* 2.51* 2.69*  -Sodium bicarb x2 Amps -Strict in and out +6.9 L -Daily weight -2/18 renal ultrasound pending -2/18 Lasix IV 60 mg x 1 -2/18 0.45% saline ---> D/C    Hyperbilirubinemia Abdominal exam is benign, now resolved.  Parkinson's disease -Continue home medication.  Thrush -Fluconazole   Goals of care/ethics: I had a long discussion with the family the patient on 01/19/2020, expressed that he did not want to be intubated or resuscitated this was  expressed to the family.  I have expressed to the family my concerns about his extremely poor prognosis they would like to talk it over and give it a few more hours to see if he turns around to have explained to him that it is unlikely but we would do as they wish.  We will call him again this afternoon. -2/17 consult palliative care; patient with extremely poor prognosis family does not want to escalate care.  Comfort care vs home hospice vs residential hospice   DVT prophylaxis: Lovenox treatment dose Code Status: DNR Family Communication: 2/18 Advised daughter [name] (daughter) and family patient's respiratory status was deteriorating rapidly and they should come for visit. Disposition Plan:    Consultants:     Procedures/Significant Events:  2/16 bilateral lower extremity Doppler;RIGHT: Positive acute SVT involving the  right great saphenous vein, and right varicosities or other superficial veins.  LEFT: Positive acute DVT involving the left gastrocnemius veins.  2/17 PCXR; low lung volume with worsening of LEFT>> RIGHT interstitial and ground-glass opacities concerning for bilateral pneumonia.     I have personally reviewed and interpreted all radiology studies and my findings are as above.  VENTILATOR SETTINGS: HHFNC+NRB 2/18 FiO2 100% Flow; 60 L/min SPO2; 78%   Cultures 2/12 blood 1/2 positive coag negative staph most likely contaminant 2/12 SARS coronavirus 2 positive 2/17 sputum pending 2/17 urine pending 2/17 Blood pending    Antimicrobials: Anti-infectives (From admission, onward)   Start     Dose/Rate Stop   01/23/20 0800  ceFEPIme (MAXIPIME) 2 g in sodium chloride 0.9 % 100 mL IVPB     2 g 200 mL/hr over 30 Minutes     01/18/20 1000  remdesivir 100 mg in sodium chloride 0.9 % 100 mL IVPB     100 mg 200 mL/hr over 30 Minutes 01/21/20 1106   01/18/20 0400  azithromycin (ZITHROMAX) 500 mg in sodium chloride 0.9 % 250 mL IVPB     500 mg 250 mL/hr over 60  Minutes 01/22/20 0422   01/18/20 0300  cefTRIAXone (ROCEPHIN) 2 g in sodium chloride 0.9 % 100 mL IVPB     2 g 200 mL/hr over 30 Minutes 01/22/20 0317   02/01/2020 2045  remdesivir 100 mg in sodium chloride 0.9 % 100 mL IVPB     100 mg 200 mL/hr over 30 Minutes 01/24/2020 2232       Devices   LINES      Continuous Infusions: . sodium chloride 50 mL/hr at 01/23/20 0000  . sodium chloride    . heparin 1,400 Units/hr (01/22/20 2101)     Objective: Vitals:   01/23/20 0453 01/23/20 0500 01/23/20 0622 01/23/20 0648  BP:      Pulse: (!) 59  (!) 107 (!) 106  Resp: 17  (!) 27 (!) 25  Temp:      TempSrc:      SpO2: (!) 81% (!) 77% (!) 73% (!) 77%  Weight:      Height:        Intake/Output Summary (Last 24 hours) at 01/23/2020 0720 Last data filed at 01/23/2020 0622 Gross per 24 hour  Intake 1575.01 ml  Output 925 ml  Net 650.01 ml   Filed Weights   01/20/2020 2027 01/18/20 0400 01/19/20 0419  Weight: 88.9 kg 85.8  kg 85.9 kg    Physical Exam:  General: A/O x2 (does not know where, when), positive acute respiratory distress (happy hypoxic) Eyes: negative scleral hemorrhage, negative anisocoria, negative icterus ENT: Negative Runny nose, negative gingival bleeding, Neck:  Negative scars, masses, torticollis, lymphadenopathy, JVD Lungs: Tachypneic mild bibasilar crackles, without wheezes  Cardiovascular: Tachycardic without murmur gallop or rub normal S1 and S2 Abdomen: negative abdominal pain, nondistended, positive soft, bowel sounds, no rebound, no ascites, no appreciable mass Extremities: No significant cyanosis, clubbing, or edema bilateral lower extremities Skin: Negative rashes, lesions, ulcers Psychiatric:  Negative depression, negative anxiety, negative fatigue, negative mania  Central nervous system:  Cranial nerves II through XII intact, tongue/uvula midline, all extremities muscle strength 5/5, sensation intact throughout,  negative dysarthria, negative expressive  aphasia, negative receptive aphasia. .     Data Reviewed: Care during the described time interval was provided by me .  I have reviewed this patient's available data, including medical history, events of note, physical examination, and all test results as part of my evaluation.   CBC: Recent Labs  Lab 01/19/20 0225 01/19/20 0225 01/20/20 0134 01/20/20 0535 01/21/20 0544 01/22/20 0249 01/23/20 0020  WBC 18.3*  --  21.4*  --  34.3* 29.6* 31.6*  NEUTROABS 17.0*  --  20.2*  --  31.8* 27.9* 28.8*  HGB 13.6   < > 14.6 15.0 17.5* 16.1 16.5  HCT 40.1   < > 42.3 44.0 51.8 47.6 48.0  MCV 92.8  --  92.6  --  94.0 93.9 93.2  PLT 172  --  203  --  251 PLATELET CLUMPS NOTED ON SMEAR, COUNT APPEARS DECREASED 125*   < > = values in this interval not displayed.   Basic Metabolic Panel: Recent Labs  Lab 01/19/20 0225 01/19/20 0225 01/20/20 0134 01/20/20 0134 01/20/20 0535 01/21/20 0544 01/22/20 0249 01/22/20 2004 01/23/20 0020  NA 139   < > 143   < > 144 143 147* 147* 148*  K 3.6   < > 3.9   < > 3.8 3.8 4.8 5.0 4.6  CL 106   < > 109  --   --  106 111 108 108  CO2 20*   < > 21*  --   --  15* 16* 18* 22  GLUCOSE 207*   < > 190*  --   --  246* 211* 208* 205*  BUN 35*   < > 38*  --   --  55* 90* 113* 114*  CREATININE 1.12   < > 1.00  --   --  1.25* 2.15* 2.51* 2.69*  CALCIUM 8.5*   < > 8.8*  --   --  8.4* 8.4* 8.3* 8.4*  MG 1.8  --   --   --   --   --   --  2.6* 2.6*  PHOS  --   --   --   --   --   --   --   --  4.7*   < > = values in this interval not displayed.   GFR: Estimated Creatinine Clearance: 25.3 mL/min (A) (by C-G formula based on SCr of 2.69 mg/dL (H)). Liver Function Tests: Recent Labs  Lab 01/19/20 0225 01/20/20 0134 01/21/20 0544 01/22/20 0249 01/23/20 0020  AST 45* 57* 66* 60* 58*  ALT 10 9 14 10 7   ALKPHOS 143* 147* 195* 230* 209*  BILITOT 1.2 0.7 1.2 1.6* 1.6*  PROT 6.5 6.7 7.2 6.3* 6.1*  ALBUMIN 2.9* 3.3*  3.1* 2.9* 3.0*   No results for input(s): LIPASE,  AMYLASE in the last 168 hours. No results for input(s): AMMONIA in the last 168 hours. Coagulation Profile: No results for input(s): INR, PROTIME in the last 168 hours. Cardiac Enzymes: No results for input(s): CKTOTAL, CKMB, CKMBINDEX, TROPONINI in the last 168 hours. BNP (last 3 results) No results for input(s): PROBNP in the last 8760 hours. HbA1C: No results for input(s): HGBA1C in the last 72 hours. CBG: Recent Labs  Lab 01/21/20 1927 01/22/20 0720 01/22/20 1112 01/22/20 1632 01/22/20 2011  GLUCAP 218* 184* 224* 184* 202*   Lipid Profile: No results for input(s): CHOL, HDL, LDLCALC, TRIG, CHOLHDL, LDLDIRECT in the last 72 hours. Thyroid Function Tests: No results for input(s): TSH, T4TOTAL, FREET4, T3FREE, THYROIDAB in the last 72 hours. Anemia Panel: Recent Labs    01/22/20 0249 01/23/20 0020  FERRITIN 1,890* 1,936*   Urine analysis: No results found for: COLORURINE, APPEARANCEUR, LABSPEC, PHURINE, GLUCOSEU, HGBUR, BILIRUBINUR, KETONESUR, PROTEINUR, UROBILINOGEN, NITRITE, LEUKOCYTESUR Sepsis Labs: @LABRCNTIP (procalcitonin:4,lacticidven:4)  ) Recent Results (from the past 240 hour(s))  Blood culture (routine x 2)     Status: Abnormal   Collection Time: 01/27/2020  7:25 PM   Specimen: BLOOD  Result Value Ref Range Status   Specimen Description   Final    BLOOD BLOOD LEFT FOREARM Performed at [facility], [address] [place], [place] [address]    Special Requests   Final    BOTTLES DRAWN AEROBIC AND ANAEROBIC Blood Culture adequate volume Performed at [facility], [address] [place], [place] [address]    Culture  Setup Time   Final    GRAM POSITIVE COCCI AEROBIC BOTTLE ONLY CRITICAL RESULT CALLED TO, READ BACK BY AND VERIFIED WITH: PHARMD MAY BALL @1501  01/18/20 AKT    Culture (A)  Final    STAPHYLOCOCCUS SPECIES (COAGULASE NEGATIVE) THE SIGNIFICANCE OF ISOLATING THIS ORGANISM FROM A SINGLE SET OF BLOOD CULTURES WHEN MULTIPLE  SETS ARE DRAWN IS UNCERTAIN. PLEASE NOTIFY THE MICROBIOLOGY DEPARTMENT WITHIN ONE WEEK IF SPECIATION AND SENSITIVITIES ARE REQUIRED. Performed at [facility], [place] [address] [place], [place] [address]    Report Status 01/19/2020 FINAL  Final  Blood culture (routine x 2)     Status: None   Collection Time: 02/01/2020  7:54 PM   Specimen: BLOOD  Result Value Ref Range Status   Specimen Description   Final    BLOOD RIGHT ANTECUBITAL Performed at [facility], [address] [place], [place] [address]    Special Requests   Final    BOTTLES DRAWN AEROBIC AND ANAEROBIC Blood Culture adequate volume Performed at [facility], [address] [place], [place] [address]    Culture   Final    NO GROWTH 5 DAYS Performed at [facility], [place] [address], [place],  [address]    Report Status 01/22/2020 FINAL  Final  SARS Coronavirus 2 Ag (30 min TAT) - Nasal Swab (BD Veritor Kit)     Status: Abnormal   Collection Time: 01/12/2020  7:59 PM   Specimen: Nasal Swab (BD Veritor Kit)  Result Value Ref Range Status   SARS Coronavirus 2 Ag POSITIVE (A) NEGATIVE Final    Comment: RESULT CALLED TO, READ BACK BY AND VERIFIED WITH: KELLY NEAL RN @2034  01/14/2020 OLSONM (NOTE) SARS-CoV-2 antigen PRESENT. Positive results indicate the presence of viral antigens, but clinical correlation with patient history and  other diagnostic information is necessary to determine patient infection status.  Positive results do not rule out bacterial infection or co-infection  with other viruses. False positive results are rare but can occur, and confirmatory RT-PCR testing may be appropriate in some circumstances. The expected result is Negative. Fact Sheet for Patients: PodPark.tn Fact Sheet for Providers: GiftContent.is  This test is not yet approved or cleared by the [place] FDA and  has been authorized for  detection and/or diagnosis of SARS-CoV-2 by FDA under an Emergency Use Authorization (EUA).  This EUA will remain in effect (meaning this test can be used) for the duration of  the COVID-19 decla ration under Section 564(b)(1) of the Act, 21 U.S.C. section 360bbb-3(b)(1), unless the authorization is terminated or revoked sooner. Performed at [facility], [address] [place], [place] [address]   MRSA PCR Screening     Status: None   Collection Time: 01/20/20  9:40 AM   Specimen: Nasopharyngeal  Result Value Ref Range Status   MRSA by PCR NEGATIVE NEGATIVE Final    Comment:        The GeneXpert MRSA Assay (FDA approved for NASAL specimens only), is one component of a comprehensive MRSA colonization surveillance program. It is not intended to diagnose MRSA infection nor to guide or monitor treatment for MRSA infections. Performed at [facility], [place] [address] [place], [place] [address]   Culture, blood (routine x 2)     Status: None (Preliminary result)   Collection Time: 01/22/20  7:54 PM   Specimen: BLOOD  Result Value Ref Range Status   Specimen Description BLOOD RIGHT ANTECUBITAL  Final   Special Requests   Final    BOTTLES DRAWN AEROBIC AND ANAEROBIC Blood Culture adequate volume   Culture   Final    NO GROWTH < 12 HOURS Performed at [facility], [place] [address], [place], [place] [address]    Report Status PENDING  Incomplete  Culture, blood (routine x 2)     Status: None (Preliminary result)   Collection Time: 01/22/20  8:04 PM   Specimen: BLOOD  Result Value Ref Range Status   Specimen Description BLOOD LEFT ANTECUBITAL  Final   Special Requests   Final    BOTTLES DRAWN AEROBIC ONLY Blood Culture results may not be optimal due to an inadequate volume of blood received in culture bottles   Culture   Final    NO GROWTH < 12 HOURS Performed at [facility], [place] [address], [place], [place] [address]    Report Status  PENDING  Incomplete         Radiology Studies: CT HEAD WO CONTRAST  Result Date: 01/22/2020 CLINICAL DATA:  74 year old male with altered mental status and unresponsiveness. EXAM: CT HEAD WITHOUT CONTRAST TECHNIQUE: Contiguous axial images were obtained from the base of the skull through the vertex without intravenous contrast. COMPARISON:  None. FINDINGS: Evaluation of this exam is limited due to motion artifact. Brain: Mild age-related atrophy and chronic microvascular ischemic changes. There is no acute intracranial hemorrhage. No mass effect or midline shift. No extra-axial fluid collection. Vascular: No hyperdense vessel or unexpected calcification. Skull: Normal. Negative for fracture or focal lesion. Sinuses/Orbits: No acute finding. Other: None IMPRESSION: No acute intracranial pathology. Electronically Signed   By: Anner Crete M.D.   On: 01/22/2020 19:28   DG CHEST PORT 1 VIEW  Result Date: 01/22/2020 CLINICAL DATA:  Short of breath EXAM: PORTABLE CHEST  1 VIEW COMPARISON:  01/31/2020 FINDINGS: Low lung volumes. Interval worsening of left greater than right diffuse interstitial and ground-glass opacity. No pleural effusion. Stable cardiomediastinal silhouette. No pneumothorax. IMPRESSION: Low lung volume with worsening of left greater than right interstitial and ground-glass opacities concerning for bilateral pneumonia. Electronically Signed   By: Donavan Foil M.D.   On: 01/22/2020 20:02   VAS [place] LOWER EXTREMITY VENOUS (DVT)  Result Date: 01/21/2020  Lower Venous DVTStudy Indications: Elevated D dimer.  Performing Technologist: Antonieta Pert RDMS, RVT  Examination Guidelines: A complete evaluation includes B-mode imaging, spectral Doppler, color Doppler, and power Doppler as needed of all accessible portions of each vessel. Bilateral testing is considered an integral part of a complete examination. Limited examinations for reoccurring indications may be performed as noted. The  reflux portion of the exam is performed with the patient in reverse Trendelenburg.  +-------------+---------------+---------+-----------+-------------+------------+ RIGHT        CompressibilityPhasicitySpontaneityProperties   Thrombus                                                                  Aging        +-------------+---------------+---------+-----------+-------------+------------+ CFV          Full           Yes      Yes                                  +-------------+---------------+---------+-----------+-------------+------------+ SFJ          Full                                                         +-------------+---------------+---------+-----------+-------------+------------+ FV Prox      Full                                                         +-------------+---------------+---------+-----------+-------------+------------+ FV Mid       Full                                                         +-------------+---------------+---------+-----------+-------------+------------+ FV Distal    Full                                                         +-------------+---------------+---------+-----------+-------------+------------+ PFV          Full                                                         +-------------+---------------+---------+-----------+-------------+------------+  POP          Full           Yes      Yes                                  +-------------+---------------+---------+-----------+-------------+------------+ PTV          Full                                                         +-------------+---------------+---------+-----------+-------------+------------+ PERO         Full                                                         +-------------+---------------+---------+-----------+-------------+------------+ GSV          Partial                                                       +-------------+---------------+---------+-----------+-------------+------------+ varicosititesNone                               softly       Acute                                                        echogenic                 +-------------+---------------+---------+-----------+-------------+------------+   +---------+---------------+---------+-----------+----------+--------------+ LEFT     CompressibilityPhasicitySpontaneityPropertiesThrombus Aging +---------+---------------+---------+-----------+----------+--------------+ CFV      Full           Yes      Yes                                 +---------+---------------+---------+-----------+----------+--------------+ SFJ      Full                                                        +---------+---------------+---------+-----------+----------+--------------+ FV Prox  Full                                                        +---------+---------------+---------+-----------+----------+--------------+ FV Mid   Full                                                        +---------+---------------+---------+-----------+----------+--------------+  FV DistalFull                                                        +---------+---------------+---------+-----------+----------+--------------+ PFV      Full                                                        +---------+---------------+---------+-----------+----------+--------------+ POP      Full           Yes      Yes                                 +---------+---------------+---------+-----------+----------+--------------+ PTV      Full                                                        +---------+---------------+---------+-----------+----------+--------------+ PERO     Full                                                        +---------+---------------+---------+-----------+----------+--------------+ Gastroc  Partial                                       Acute          +---------+---------------+---------+-----------+----------+--------------+ GSV      Full                                                        +---------+---------------+---------+-----------+----------+--------------+     Summary: RIGHT: - Findings consistent with acute superficial vein thrombosis involving the right great saphenous vein, and right varicosities or other superficial veins. - No cystic structure found in the popliteal fossa.  LEFT: - Findings consistent with acute deep vein thrombosis involving the left gastrocnemius veins. - No cystic structure found in the popliteal fossa.  *See table(s) above for measurements and observations. Electronically signed by Deitra Mayo MD on 01/21/2020 at 4:12:51 PM.    Final         Scheduled Meds: . carbidopa-levodopa  2 tablet Oral QHS  . carbidopa-levodopa  2 tablet Oral 3 times per day   And  . carbidopa-levodopa  1 tablet Oral QHS  . chlorhexidine  15 mL Mouth Rinse BID  . Chlorhexidine Gluconate Cloth  6 each Topical Daily  . dexamethasone (DECADRON) injection  6 mg Intravenous Q24H  . feeding supplement (ENSURE ENLIVE)  237 mL Oral TID BM  . insulin aspart  0-9 Units Subcutaneous TID WC  . mouth rinse  15 mL Mouth Rinse  [id number]  . metoprolol tartrate  50 mg Oral BID  . rOPINIRole  3 mg Oral Q24H  . sodium chloride flush  3 mL Intravenous Q12H  . sodium chloride flush  3 mL Intravenous Q12H   Continuous Infusions: . sodium chloride 50 mL/hr at 01/23/20 0000  . sodium chloride    . heparin 1,400 Units/hr (01/22/20 2101)     LOS: 5 days   The patient is critically ill with multiple organ systems failure and requires high complexity decision making for assessment and support, frequent evaluation and titration of therapies, application of advanced monitoring technologies and extensive interpretation of multiple databases. Critical Care Time devoted to patient care services  described in this note  Time spent: 40 minutes     Sheriff Rodenberg, Geraldo Docker, MD [facility] Pager [phone]  If 7PM-7AM, please contact night-coverage [email or web address] Password [facility] 01/23/2020, 7:20 AM

## 2020-01-23 NOTE — Progress Notes (Addendum)
0725: Assumed care of Pt from night RN. Pt alert, slightly disoriented, restless, but cooperative. SpO2 75% on 50 L 100% HHFNC and NRB. Pt will occasionally take off mask and sats immediately drop to low 60's. HR NSR, BP WNL. RR labored and tachypneic. Heparin gtt infusing, condam cath intact. Tried to reposition Pt for comfort, but refused and said he's fine. Bed alarm on, call bell within reach.  0745: [place] turned up to 60 L per MD. RR 22. SpO2 77% now on 60 L 100% HHFNC and NRB. RT made aware, Will monitor  0800: Morphine given with slight relief of symptoms. SpO2 up now to 78%, RR 20.   0820: Echo tech at bedside  0845: Renal US tech at bedside

## 2020-01-24 ENCOUNTER — Inpatient Hospital Stay (HOSPITAL_COMMUNITY): Payer: Medicare Other

## 2020-01-24 LAB — CBC WITH DIFFERENTIAL/PLATELET
Abs Immature Granulocytes: 0.56 10*3/uL — ABNORMAL HIGH (ref 0.00–0.07)
Basophils Absolute: 0.1 10*3/uL (ref 0.0–0.1)
Basophils Relative: 0 %
Eosinophils Absolute: 0 10*3/uL (ref 0.0–0.5)
Eosinophils Relative: 0 %
HCT: 45 % (ref 39.0–52.0)
Hemoglobin: 15.3 g/dL (ref 13.0–17.0)
Immature Granulocytes: 2 %
Lymphocytes Relative: 1 %
Lymphs Abs: 0.4 10*3/uL — ABNORMAL LOW (ref 0.7–4.0)
MCH: 31.9 pg (ref 26.0–34.0)
MCHC: 34 g/dL (ref 30.0–36.0)
MCV: 93.8 fL (ref 80.0–100.0)
Monocytes Absolute: 0.4 10*3/uL (ref 0.1–1.0)
Monocytes Relative: 1 %
Neutro Abs: 31.3 10*3/uL — ABNORMAL HIGH (ref 1.7–7.7)
Neutrophils Relative %: 96 %
Platelets: 110 10*3/uL — ABNORMAL LOW (ref 150–400)
RBC: 4.8 MIL/uL (ref 4.22–5.81)
RDW: 14.9 % (ref 11.5–15.5)
WBC: 32.8 10*3/uL — ABNORMAL HIGH (ref 4.0–10.5)
nRBC: 0.2 % (ref 0.0–0.2)

## 2020-01-24 LAB — COMPREHENSIVE METABOLIC PANEL
ALT: 9 U/L (ref 0–44)
AST: 39 U/L (ref 15–41)
Albumin: 2.9 g/dL — ABNORMAL LOW (ref 3.5–5.0)
Alkaline Phosphatase: 202 U/L — ABNORMAL HIGH (ref 38–126)
Anion gap: 18 — ABNORMAL HIGH (ref 5–15)
BUN: 147 mg/dL — ABNORMAL HIGH (ref 8–23)
CO2: 22 mmol/L (ref 22–32)
Calcium: 8 mg/dL — ABNORMAL LOW (ref 8.9–10.3)
Chloride: 111 mmol/L (ref 98–111)
Creatinine, Ser: 3.44 mg/dL — ABNORMAL HIGH (ref 0.61–1.24)
GFR calc Af Amer: 19 mL/min — ABNORMAL LOW (ref >60)
GFR calc non Af Amer: 16 mL/min — ABNORMAL LOW (ref >60)
Glucose, Bld: 196 mg/dL — ABNORMAL HIGH (ref 70–99)
Potassium: 4.3 mmol/L (ref 3.5–5.1)
Sodium: 151 mmol/L — ABNORMAL HIGH (ref 135–145)
Total Bilirubin: 1.7 mg/dL — ABNORMAL HIGH (ref 0.3–1.2)
Total Protein: 5.8 g/dL — ABNORMAL LOW (ref 6.5–8.1)

## 2020-01-24 LAB — GLUCOSE, CAPILLARY
Glucose-Capillary: 189 mg/dL — ABNORMAL HIGH (ref 70–99)
Glucose-Capillary: 172 mg/dL — ABNORMAL HIGH (ref 70–99)
Glucose-Capillary: 196 mg/dL — ABNORMAL HIGH (ref 70–99)

## 2020-01-24 LAB — D-DIMER, QUANTITATIVE: D-Dimer, Quant: >20 ug/mL-FEU — ABNORMAL HIGH (ref 0.00–0.50)

## 2020-01-24 LAB — FERRITIN: Ferritin: 423 ng/mL — ABNORMAL HIGH (ref 24–336)

## 2020-01-24 LAB — HEPARIN LEVEL (UNFRACTIONATED)
Heparin Unfractionated: 0.76 IU/mL — ABNORMAL HIGH (ref 0.30–0.70)
Heparin Unfractionated: 0.49 IU/mL (ref 0.30–0.70)

## 2020-01-24 LAB — PHOSPHORUS: Phosphorus: 6.7 mg/dL — ABNORMAL HIGH (ref 2.5–4.6)

## 2020-01-24 LAB — C-REACTIVE PROTEIN: CRP: 10.2 mg/dL — ABNORMAL HIGH (ref <1.0)

## 2020-01-24 LAB — MAGNESIUM: Magnesium: 2.6 mg/dL — ABNORMAL HIGH (ref 1.7–2.4)

## 2020-01-24 MED ORDER — DEXTROSE 5 % IV SOLN: INTRAVENOUS | Status: DC

## 2020-01-24 MED ORDER — FENTANYL CITRATE (PF) 100 MCG/2ML IJ SOLN: 50.0000 ug | INTRAMUSCULAR | Status: DC | PRN

## 2020-01-24 MED ORDER — FENTANYL CITRATE (PF) 100 MCG/2ML IJ SOLN
50.0000 ug | Freq: Four times a day (QID) | INTRAMUSCULAR | Status: DC | PRN
  Administered 2020-01-25: 50 ug via INTRAVENOUS
  Filled 2020-01-24 (×2): qty 2

## 2020-01-24 NOTE — Progress Notes (Signed)
[place] for IV Heparin  Indication: DVT  Allergies  Allergen Reactions  . Iodine Swelling    Patient Measurements: Height: 5\' 11"  (180.3 cm) Weight: 189 lb 6 oz (85.9 kg) IBW/kg (Calculated) : 75.3  Heparin dosing weight: 85.9 kg  Vital Signs: Temp: 97.4 F (36.3 C) (02/18 2000) Temp Source: Axillary (02/18 2000) BP: 160/70 (02/19 0000) Pulse Rate: 73 (02/19 0000)  Labs: Recent Labs    01/21/20 0544 01/21/20 0544 01/21/20 0753 01/22/20 0249 01/22/20 0249 01/22/20 2004 01/23/20 0020 01/23/20 0805 01/23/20 1658 01/24/20 0204  HGB 17.5*   < >  --  16.1  --   --  16.5  --   --   --   HCT 51.8  --   --  47.6  --   --  48.0  --   --   --   PLT 251  --   --  PLATELET CLUMPS NOTED ON SMEAR, COUNT APPEARS DECREASED  --   --  125*  --   --   --   HEPARINUNFRC  --   --   --   --   --   --  0.51 0.67  --  0.76*  CREATININE 1.25*   < >  --  2.15*   < > 2.51* 2.69*  --  3.44*  --   TROPONINIHS 21*  --  24*  --   --   --   --   --   --   --    < > = values in this interval not displayed.    Estimated Creatinine Clearance: 19.8 mL/min (A) (by C-G formula based on SCr of 3.44 mg/dL (H)).  Assessment: 52 YOM with COVID-19 pneumonia. Dopplers revealed an acute L-DVT and R-superficial thrombosis. Pharmacy consulted for Lovenox on 2/16, but patient now with worsening renal function. Discussed with Dr. Sherral Hammers - Will transition to IV Heparin at this time.   Hgb/Hct has remained stable. Platelets have fluctuated was down to 137 on 2/13, up to 250 on 2/16, now down again - given fluctuations does not appear to be HIT but will monitor closely and discuss testing with MD given further trends.   Heparin level up to slightly supratherapeutic (0.76) this morning. No bleeding noted. Noted SCr continues upward trend.  Goal of Therapy:  Heparin level 0.3-0.7 units/ml Monitor platelets by anticoagulation protocol: Yes   Plan:  - Reduce Heparin to 1200  units/hr  - Will f/u 8hr heparin level  Thank you for allowing pharmacy to be a part of this patient's care.  Sherlon Handing, PharmD, BCPS Please see amion for complete clinical pharmacist phone list 01/24/2020 4:06 AM

## 2020-01-24 NOTE — Progress Notes (Signed)
[place] for IV Heparin  Indication: DVT  Allergies  Allergen Reactions  . Iodine Swelling    Patient Measurements: Height: 5\' 11"  (180.3 cm) Weight: 189 lb 6 oz (85.9 kg) IBW/kg (Calculated) : 75.3  Heparin dosing weight: 85.9 kg  Vital Signs: Temp: 97.1 F (36.2 C) (02/19 0600) Temp Source: Axillary (02/19 0600) BP: 137/78 (02/19 0600) Pulse Rate: 71 (02/19 0600)  Labs: Recent Labs    01/22/20 0249 01/22/20 2004 01/23/20 0020 01/23/20 0805 01/23/20 1658 01/24/20 0204  HGB 16.1  --  16.5  --   --  15.3  HCT 47.6  --  48.0  --   --  45.0  PLT PLATELET CLUMPS NOTED ON SMEAR, COUNT APPEARS DECREASED  --  125*  --   --  110*  HEPARINUNFRC  --   --  0.51 0.67  --  0.76*  CREATININE 2.15*   < > 2.69*  --  3.44* 3.44*   < > = values in this interval not displayed.    Estimated Creatinine Clearance: 19.8 mL/min (A) (by C-G formula based on SCr of 3.44 mg/dL (H)).  Assessment: 71 YOM with COVID-19 pneumonia. Dopplers revealed an acute L-DVT and R-superficial thrombosis. Pharmacy consulted for Heparin dosing.   Hgb/Hct has remained stable. Platelets have fluctuated was down to 137 on 2/13, up to 250 on 2/16, now down again to 110 - given fluctuations does not appear to be HIT. Discussed with MD and will send off for a HIT antibody - low suspicion so will continue Heparin for now and followup on antibody results and further platelet trends.   Heparin level this afternoon is therapeutic after a rate decrease earlier today (HL 0.49 << 0.76, goal of 0.3-0.7). No bleeding or issues noted. Since close to goal this AM - will wait and check a confirmatory level in the morning.    Goal of Therapy:  Heparin level 0.3-0.7 units/ml Monitor platelets by anticoagulation protocol: Yes   Plan:  - Continue Heparin at 1200 units/hr (12 ml/hr) - Will continue to monitor for any signs/symptoms of bleeding and will follow up with heparin level in the  a.m.   Thank you for allowing pharmacy to be a part of this patient's care.  Alycia Rossetti, PharmD, BCPS Clinical Pharmacist 01/24/2020 9:33 AM   **Pharmacist phone directory can now be found on [email or web address] ([email or web address]).  Listed under [place].

## 2020-01-24 NOTE — Progress Notes (Signed)
Daughter was called and updated.

## 2020-01-24 NOTE — Progress Notes (Signed)
Nutrition Follow-up   DOCUMENTATION CODES:   Not applicable  INTERVENTION:   Provide Ensure Enlive po TID, each supplement provides 350 kcal and 20 grams of protein.  Pt receiving Hormel Shake daily with Breakfast which provides 520 kcals and 22 g of protein and Magic cup BID with lunch and dinner, each supplement provides 290 kcal and 9 grams of protein, automatically on meal trays to optimize nutritional intake.   Encourage PO intake when awake and alert   NUTRITION DIAGNOSIS:   Increased nutrient needs related to catabolic MYTRZNB(VAPOL-41) as evidenced by estimated needs. Ongoing.   GOAL:   Patient will meet greater than or equal to 90% of their needs Not met.   MONITOR:   PO intake, Supplement acceptance, Labs, Weight trends, I & O's  REASON FOR ASSESSMENT:   Malnutrition Screening Tool    ASSESSMENT:   74 year old male with PMHx of Parkinson's disease, Non Hodgkin's lymphoma in remission, HTN admitted with PNA due to COVID-19, sepsis, AKI.   Palliative following, pt is a DNR. Pt on 40 L HHFNC and NRB. Per RN pt remains drowsy and unable to take an po medications. Chart reviewed po intake very poor 0-25% of meals, per MD notes family does not want to escalate care. Possible comfort care.    2/18 noted pt with acute decline, family came in for a visit with patient.   Diet Order:   Diet Order            Diet full liquid Room service appropriate? Yes; Fluid consistency: Thin  Diet effective now             EDUCATION NEEDS:   No education needs have been identified at this time  Skin:  Skin Assessment: Reviewed RN Assessment  Last BM:  01/18/2020 medium type 6  Height:   Ht Readings from Last 1 Encounters:  01/18/20 5' 11"  (1.803 m)   Weight:   Wt Readings from Last 1 Encounters:  01/19/20 85.9 kg   BMI:  Body mass index is 26.41 kg/m.  Estimated Nutritional Needs:   Kcal:  2000-2200  Protein:  100-110 grams  Fluid:  per MD  Lockie Pares.,  RD, LDN, CNSC See AMiON for contact information

## 2020-01-24 NOTE — Progress Notes (Signed)
Patient has been sleeping and drowsy for first half of this shift 2/2 PRN medications for pain and anxiety. Unable to perform a full neuro exam 2/2 this; and also unable to give any oral medications tonight; patient started to wake up and become agitated around midnight; he started to pull on his NRB; and with little movement the patient's O2 saturations dropped to 74% on 40LHHF & NRB; patient was given 2 mg of ativan at that time and sats improved to 92% while proned. Will continue to monitor.

## 2020-01-24 NOTE — Progress Notes (Signed)
Palliative.  Chart reviewed.  Discussed with Dr. Sherral Hammers this am and bedside RN this afternoon.  Patient is sound asleep due to ativan doses and unable to have [place] discussions with me.  Unfortunately his creatinine continues to rise.   Family passionately advocates for him.   Appreciate excellent bedside RN who discussed his condition with family at length today.  Palliative will continue to follow with you attempting to offer support to the patient and family.  Florentina Jenny, PA-C Palliative Medicine Office:  [phone]  No charge

## 2020-01-24 NOTE — Progress Notes (Addendum)
PROGRESS NOTE    Aiyden Lauderback  CMK:[id number] DOB: [birth year] DOA: 01/30/2020 PCP: System, Pcp Not In   Brief Narrative:  Maria Gallicchio is an 74 y.o.  Hispanic male PMHx Parkinson's disease and non-Hodgkin's lymphoma in remission   who developed subjective fever malaise about 1 week prior to admission, started developing shortness of breath about 2 to 3 days ago at home his sats began reading 80% presented to [facility] found to be hypoxic,  Tachypneic chest x-ray showed bilateral infiltrates, SARS-CoV-2 PCR, his creatinine was 1.3 (up from baseline 1.0) elevated bili Ruben with a leukocytosis lactic acid of 4.3 procalcitonin 2.5 D-dimer 1.28 blood cultures ordered was fluid resuscitated started on IV Decadron on remdesivir.   Subjective: 2/19 afebrile overnight patient heavily sedated.   Assessment & Plan:   Principal Problem:   Pneumonia due to COVID-19 virus Active Problems:   Parkinson disease ([place])   Hypertension   AKI (acute kidney injury) ([place])   Severe sepsis ([place])   Hyperbilirubinemia   Acute respiratory failure with hypoxia ([place])   Parkinson's disease ([place])   Palliative care encounter  Covid pneumonia/Acute Respiratory Failure with hypoxia/CAP COVID-19 Labs  Recent Labs    01/22/20 0249 01/23/20 0020 01/24/20 0204  DDIMER >20.00* >20.00* >20.00*  FERRITIN 1,890* 1,936* 423*  CRP 22.7* 15.2* 10.2*    2/12 SARS coronavirus 2 positive  Covid pneumonia/acute respiratory failure with hypoxia/HCAP -Covid convalescent plasma x2 doses -Decadron 6 mg daily -Remdesivir per pharmacy protocol -2/15 Actemra x1 dose -Family would like the current treatment to be maintained and not to escalate his care, they did agree to IV morphine to help with the respiration. -See goals of care -Titrate O2 to maintain SPO2 88% if possible.  Patient appears to be happy hypoxic doing well low 80s/high 70% -Prone patient 16 hours/day; if cannot tolerate prone 2 to 3 hours per  shift -2/19 patient received multiple doses of Ativan, fentanyl, morphine overnight now heavily sedated. -2/19 decrease Fentanyl 50 mcg q 4 hr PRN---> q 6 PRN -2/19 Ativan 1 to 2 mg PRN q 4 hr (hold) NOTE; patient needs to wake up overnight in order for [place] to place NG tube in the a.m. and he needs to have conversation with his wife and daughter on multiple issues such as DNR -2/20 PCXR pending  Severe Sepsis -Unknown source HCAP vs bacteremia vs UTI vs unk source vs secondary to acute DVT -Leukocytosis with negative fever, bands or left shift -2/17 PCXR; worsening groundglass opacification see results below -2/18 patient's respiratory status continues to deteriorate will DC CAP coverage and start HCAP coverage x7 days.  Acute LEFT lower extremity DVT/Acute RIGHT lower extremity SVT -Treatment dose Lovenox -Unable to verify if patient has PE spoke with daughter who confirms has true iodine allergy, therefore no CTA PE protocol.  Chronic diastolic CHF -Strict in and out +7.0 L  -Daily weight Filed Weights   01/24/2020 2027 01/18/20 0400 01/19/20 0419  Weight: 88.9 kg 85.8 kg 85.9 kg  -D5W 71m/hr  Essential hypertension: -Holding antihypertensive medication due to the sepsis picture. -Patient has had multiple episode of tachycardia/bradycardia.  Sick sinus syndrome?  Will obtain echocardiogram -Echocardiogram positive for chronic diastolic CHF see results below  Sick sinus syndrome? -See HTN  Acute kidney injury: Recent Labs  Lab 01/22/20 0249 01/22/20 2004 01/23/20 0020 01/23/20 1658 01/24/20 0204  CREATININE 2.15* 2.51* 2.69* 3.44* 3.44*  -See CHF -2/18 renal ultrasound nondiagnostic see results below -2/18 Lasix IV 60 mg x  1   Hyperbilirubinemia Abdominal exam is benign, now resolved.  Parkinson's disease -Continue home medication.    Thrush -Fluconazole  Hypernatremia -See CHF   Goals of care/ethics: I had a long discussion with the family the  patient on 01/19/2020, expressed that he did not want to be intubated or resuscitated this was expressed to the family.  I have expressed to the family my concerns about his extremely poor prognosis they would like to talk it over and give it a few more hours to see if he turns around to have explained to him that it is unlikely but we would do as they wish.  We will call him again this afternoon. -2/17 consult palliative care; patient with extremely poor prognosis family does not want to escalate care.  Comfort care vs home hospice vs residential hospice   DVT prophylaxis: Lovenox treatment dose Code Status: DNR Family Communication: 2/18 Advised daughter [name] (daughter) and family patient's respiratory status was deteriorating rapidly and they should come for visit. Disposition Plan:    Consultants:     Procedures/Significant Events:  2/16 bilateral lower extremity Doppler;RIGHT: Positive acute SVT involving the  right great saphenous vein, and right varicosities or other superficial veins.  LEFT: Positive acute DVT involving the left gastrocnemius veins.  2/17 PCXR; low lung volume with worsening of LEFT>> RIGHT interstitial and ground-glass opacities concerning for bilateral pneumonia 2/18 echocardiogram;.Left Ventricle: EF= 60 -65%. The left ventricle has normal function. The left ventricle has no regional wall motion abnormalities.  -Grade I diastolic dysfunction (impaired relaxation).  -There is mildly elevated pulmonary artery systolic pressure.    2/19 renal US; No acute or focal abnormality identified. No hydronephrosis. 4 cm simple cyst left kidney    I have personally reviewed and interpreted all radiology studies and my findings are as above.  VENTILATOR SETTINGS: [place] 2/19 FiO2 100% Flow; 40 L/min SPO2; 97%   Cultures 2/12 blood 1/2 positive coag negative staph most likely contaminant 2/12 SARS coronavirus 2 positive 2/17 sputum pending 2/17 urine  pending 2/17 Blood pending    Antimicrobials: Anti-infectives (From admission, onward)   Start     Dose/Rate Stop   01/23/20 0800  ceFEPIme (MAXIPIME) 2 g in sodium chloride 0.9 % 100 mL IVPB     2 g 200 mL/hr over 30 Minutes     01/18/20 1000  remdesivir 100 mg in sodium chloride 0.9 % 100 mL IVPB     100 mg 200 mL/hr over 30 Minutes 01/21/20 1106   01/18/20 0400  azithromycin (ZITHROMAX) 500 mg in sodium chloride 0.9 % 250 mL IVPB     500 mg 250 mL/hr over 60 Minutes 01/22/20 0422   01/18/20 0300  cefTRIAXone (ROCEPHIN) 2 g in sodium chloride 0.9 % 100 mL IVPB     2 g 200 mL/hr over 30 Minutes 01/22/20 0317   01/24/2020 2045  remdesivir 100 mg in sodium chloride 0.9 % 100 mL IVPB     100 mg 200 mL/hr over 30 Minutes 01/16/2020 2232       Devices   LINES      Continuous Infusions: . sodium chloride    . ceFEPime (MAXIPIME) IV 2 g (01/24/20 0839)  . fluconazole (DIFLUCAN) IV    . heparin 1,200 Units/hr (01/24/20 0600)     Objective: Vitals:   01/24/20 0300 01/24/20 0400 01/24/20 0500 01/24/20 0600  BP: 133/70 136/74 136/69 137/78  Pulse: 71 71 73 71  Resp: 13 13 13 12   Temp:    Marland Kitchen)  97.1 F (36.2 C)  TempSrc:    Axillary  SpO2: 94% 95% 93% 95%  Weight:      Height:        Intake/Output Summary (Last 24 hours) at 01/24/2020 5615 Last data filed at 01/24/2020 0600 Gross per 24 hour  Intake 489.57 ml  Output 600 ml  Net -110.43 ml   Filed Weights   02/02/2020 2027 01/18/20 0400 01/19/20 0419  Weight: 88.9 kg 85.8 kg 85.9 kg   Physical Exam:  General: Heavily sedated, positive acute respiratory distress Eyes: negative scleral hemorrhage, negative anisocoria, negative icterus ENT: Negative Runny nose, negative gingival bleeding, Neck:  Negative scars, masses, torticollis, lymphadenopathy, JVD Lungs: Clear to auscultation bilaterally without wheezes or crackles Cardiovascular: Regular rate and rhythm without murmur gallop or rub normal S1 and S2 Abdomen:  negative abdominal pain, nondistended, positive soft, bowel sounds, no rebound, no ascites, no appreciable mass Extremities: No significant cyanosis, clubbing, or edema bilateral lower extremities Skin: Negative rashes, lesions, ulcers Psychiatric: Unable to evaluate secondary to sedation Central nervous system: Unable to evaluate secondary to sedation     Data Reviewed: Care during the described time interval was provided by me .  I have reviewed this patient's available data, including medical history, events of note, physical examination, and all test results as part of my evaluation.   CBC: Recent Labs  Lab 01/20/20 0134 01/20/20 0134 01/20/20 0535 01/21/20 0544 01/22/20 0249 01/23/20 0020 01/24/20 0204  WBC 21.4*  --   --  34.3* 29.6* 31.6* 32.8*  NEUTROABS 20.2*  --   --  31.8* 27.9* 28.8* 31.3*  HGB 14.6   < > 15.0 17.5* 16.1 16.5 15.3  HCT 42.3   < > 44.0 51.8 47.6 48.0 45.0  MCV 92.6  --   --  94.0 93.9 93.2 93.8  PLT 203  --   --  251 PLATELET CLUMPS NOTED ON SMEAR, COUNT APPEARS DECREASED 125* 110*   < > = values in this interval not displayed.   Basic Metabolic Panel: Recent Labs  Lab 01/19/20 0225 01/20/20 0134 01/22/20 0249 01/22/20 2004 01/23/20 0020 01/23/20 1658 01/24/20 0204  NA 139   < > 147* 147* 148* 152* 151*  K 3.6   < > 4.8 5.0 4.6 4.1 4.3  CL 106   < > 111 108 108 111 111  CO2 20*   < > 16* 18* 22 21* 22  GLUCOSE 207*   < > 211* 208* 205* 195* 196*  BUN 35*   < > 90* 113* 114* 150* 147*  CREATININE 1.12   < > 2.15* 2.51* 2.69* 3.44* 3.44*  CALCIUM 8.5*   < > 8.4* 8.3* 8.4* 8.1* 8.0*  MG 1.8  --   --  2.6* 2.6* 2.7* 2.6*  PHOS  --   --   --   --  4.7*  --  6.7*   < > = values in this interval not displayed.   GFR: Estimated Creatinine Clearance: 19.8 mL/min (A) (by C-G formula based on SCr of 3.44 mg/dL (H)). Liver Function Tests: Recent Labs  Lab 01/20/20 0134 01/21/20 0544 01/22/20 0249 01/23/20 0020 01/24/20 0204  AST 57* 66* 60*  58* 39  ALT 9 14 10 7 9   ALKPHOS 147* 195* 230* 209* 202*  BILITOT 0.7 1.2 1.6* 1.6* 1.7*  PROT 6.7 7.2 6.3* 6.1* 5.8*  ALBUMIN 3.3* 3.1* 2.9* 3.0* 2.9*   No results for input(s): LIPASE, AMYLASE in the last 168 hours. No results  for input(s): AMMONIA in the last 168 hours. Coagulation Profile: No results for input(s): INR, PROTIME in the last 168 hours. Cardiac Enzymes: No results for input(s): CKTOTAL, CKMB, CKMBINDEX, TROPONINI in the last 168 hours. BNP (last 3 results) No results for input(s): PROBNP in the last 8760 hours. HbA1C: No results for input(s): HGBA1C in the last 72 hours. CBG: Recent Labs  Lab 01/23/20 0710 01/23/20 1209 01/23/20 1740 01/23/20 2113 01/24/20 0811  GLUCAP 194* 247* 168* 157* 189*   Lipid Profile: No results for input(s): CHOL, HDL, LDLCALC, TRIG, CHOLHDL, LDLDIRECT in the last 72 hours. Thyroid Function Tests: No results for input(s): TSH, T4TOTAL, FREET4, T3FREE, THYROIDAB in the last 72 hours. Anemia Panel: Recent Labs    01/23/20 0020 01/24/20 0204  FERRITIN 1,936* 423*   Urine analysis: No results found for: COLORURINE, APPEARANCEUR, LABSPEC, PHURINE, GLUCOSEU, HGBUR, BILIRUBINUR, KETONESUR, PROTEINUR, UROBILINOGEN, NITRITE, LEUKOCYTESUR Sepsis Labs: @LABRCNTIP (procalcitonin:4,lacticidven:4)  ) Recent Results (from the past 240 hour(s))  Blood culture (routine x 2)     Status: Abnormal   Collection Time: 01/12/2020  7:25 PM   Specimen: BLOOD  Result Value Ref Range Status   Specimen Description   Final    BLOOD BLOOD LEFT FOREARM Performed at [facility], [address] [place], [place] [address]    Special Requests   Final    BOTTLES DRAWN AEROBIC AND ANAEROBIC Blood Culture adequate volume Performed at [facility] [address] [place], [place] [address]    Culture  Setup Time   Final    GRAM POSITIVE COCCI AEROBIC BOTTLE ONLY CRITICAL RESULT CALLED TO, READ BACK BY AND VERIFIED WITH: PHARMD  MAY BALL @1501  01/18/20 AKT    Culture (A)  Final    STAPHYLOCOCCUS SPECIES (COAGULASE NEGATIVE) THE SIGNIFICANCE OF ISOLATING THIS ORGANISM FROM A SINGLE SET OF BLOOD CULTURES WHEN MULTIPLE SETS ARE DRAWN IS UNCERTAIN. PLEASE NOTIFY THE MICROBIOLOGY DEPARTMENT WITHIN ONE WEEK IF SPECIATION AND SENSITIVITIES ARE REQUIRED. Performed at [facility], [place] [address] [place], [place] [address]    Report Status 01/19/2020 FINAL  Final  Blood culture (routine x 2)     Status: None   Collection Time: 01/22/2020  7:54 PM   Specimen: BLOOD  Result Value Ref Range Status   Specimen Description   Final    BLOOD RIGHT ANTECUBITAL Performed at [facility], [address] [place], [place] [address]    Special Requests   Final    BOTTLES DRAWN AEROBIC AND ANAEROBIC Blood Culture adequate volume Performed at [facility], [address] [place], [place] [address]    Culture   Final    NO GROWTH 5 DAYS Performed at [facility], [place] [address], [place], [place] [address]    Report Status 01/22/2020 FINAL  Final  SARS Coronavirus 2 Ag (30 min TAT) - Nasal Swab (BD Veritor Kit)     Status: Abnormal   Collection Time: 01/14/2020  7:59 PM   Specimen: Nasal Swab (BD Veritor Kit)  Result Value Ref Range Status   SARS Coronavirus 2 Ag POSITIVE (A) NEGATIVE Final    Comment: RESULT CALLED TO, READ BACK BY AND VERIFIED WITH: KELLY NEAL RN @2034  01/26/2020 OLSONM (NOTE) SARS-CoV-2 antigen PRESENT. Positive results indicate the presence of viral antigens, but clinical correlation with patient history and other diagnostic information is necessary to determine patient infection status.  Positive results do not rule out bacterial infection  or co-infection  with other viruses. False positive results are rare but can occur, and confirmatory RT-PCR testing may be appropriate in some circumstances. The expected result is Negative. Fact Sheet for Patients:  PodPark.tn Fact Sheet for Providers: GiftContent.is  This test is not yet approved or cleared by the [place] FDA and  has been authorized for detection and/or diagnosis of SARS-CoV-2 by FDA under an Emergency Use Authorization (EUA).  This EUA will remain in effect (meaning this test can be used) for the duration of  the COVID-19 decla ration under Section 564(b)(1) of the Act, 21 U.S.C. section 360bbb-3(b)(1), unless the authorization is terminated or revoked sooner. Performed at [facility], [address] [place], [place] [address]   MRSA PCR Screening     Status: None   Collection Time: 01/20/20  9:40 AM   Specimen: Nasopharyngeal  Result Value Ref Range Status   MRSA by PCR NEGATIVE NEGATIVE Final    Comment:        The GeneXpert MRSA Assay (FDA approved for NASAL specimens only), is one component of a comprehensive MRSA colonization surveillance program. It is not intended to diagnose MRSA infection nor to guide or monitor treatment for MRSA infections. Performed at [facility], [place] [address] [place] [address], [place], [place] [address]   Culture, blood (routine x 2)     Status: None (Preliminary result)   Collection Time: 01/22/20  7:54 PM   Specimen: BLOOD  Result Value Ref Range Status   Specimen Description BLOOD RIGHT ANTECUBITAL  Final   Special Requests   Final    BOTTLES DRAWN AEROBIC AND ANAEROBIC Blood Culture adequate volume   Culture   Final    NO GROWTH 2 DAYS Performed at [facility], [place] [address] [place], [place] [address]    Report Status PENDING  Incomplete  Culture, blood (routine x 2)     Status: None (Preliminary result)   Collection Time: 01/22/20  8:04 PM   Specimen: BLOOD  Result Value Ref Range Status   Specimen Description BLOOD LEFT ANTECUBITAL  Final   Special Requests   Final    BOTTLES DRAWN AEROBIC ONLY Blood Culture results may not be optimal  due to an inadequate volume of blood received in culture bottles   Culture   Final    NO GROWTH 2 DAYS Performed at [facility], [place] [address], [place], [place] [address]    Report Status PENDING  Incomplete  MRSA PCR Screening     Status: None   Collection Time: 01/23/20  9:20 AM   Specimen: Nasal Mucosa; Nasopharyngeal  Result Value Ref Range Status   MRSA by PCR NEGATIVE NEGATIVE Final    Comment:        The GeneXpert MRSA Assay (FDA approved for NASAL specimens only), is one component of a comprehensive MRSA colonization surveillance program. It is not intended to diagnose MRSA infection nor to guide or monitor treatment for MRSA infections. Performed at [facility], [place] [address], [place], [place] [address]          Radiology Studies: CT HEAD WO CONTRAST  Result Date: 01/22/2020 CLINICAL DATA:  74 year old male with altered mental status and unresponsiveness. EXAM: CT HEAD WITHOUT CONTRAST TECHNIQUE: Contiguous axial images were obtained from the base of the skull through the vertex without intravenous contrast. COMPARISON:  None. FINDINGS: Evaluation of this exam is limited due to motion artifact. Brain: Mild age-related atrophy and chronic  microvascular ischemic changes. There is no acute intracranial hemorrhage. No mass effect or midline shift. No extra-axial fluid collection. Vascular: No hyperdense vessel or unexpected calcification. Skull: Normal. Negative for fracture or focal lesion. Sinuses/Orbits: No acute finding. Other: None IMPRESSION: No acute intracranial pathology. Electronically Signed   By: Anner Crete M.D.   On: 01/22/2020 19:28   US RENAL  Result Date: 01/23/2020 CLINICAL DATA:  Acute renal failure. EXAM: RENAL / URINARY TRACT ULTRASOUND COMPLETE COMPARISON:  No prior. FINDINGS: Right Kidney: Renal measurements: 12.1 x 6.5 x 4.8 cm = volume: 196 mL . Echogenicity within normal limits. No mass or hydronephrosis visualized.  Left Kidney: Renal measurements: 11.5 x 4.8 x 6.7 cm = volume: 194 mL. Echogenicity within normal limits. 4 cm simple cyst. No hydronephrosis visualized. Bladder: Appears normal for degree of bladder distention. Other: None. IMPRESSION: No acute or focal abnormality identified. No hydronephrosis. 4 cm simple cyst left kidney. Electronically Signed   By: Marcello Moores  Register   On: 01/23/2020 10:39   DG CHEST PORT 1 VIEW  Result Date: 01/22/2020 CLINICAL DATA:  Short of breath EXAM: PORTABLE CHEST 1 VIEW COMPARISON:  01/31/2020 FINDINGS: Low lung volumes. Interval worsening of left greater than right diffuse interstitial and ground-glass opacity. No pleural effusion. Stable cardiomediastinal silhouette. No pneumothorax. IMPRESSION: Low lung volume with worsening of left greater than right interstitial and ground-glass opacities concerning for bilateral pneumonia. Electronically Signed   By: Donavan Foil M.D.   On: 01/22/2020 20:02   ECHOCARDIOGRAM COMPLETE  Result Date: 01/23/2020    ECHOCARDIOGRAM REPORT   Patient Name:   EARNIE BECHARD Date of Exam: 01/23/2020 Medical Rec #:  [id number]  Height:       71.0 in Accession #:    [id number] Weight:       189.4 lb Date of Birth:  [birth year]   BSA:          2.06 m Patient Age:    89 years   BP:           120/75 mmHg Patient Gender: M          HR:           74 bpm. Exam Location:  Inpatient Procedure: 2D Echo, Cardiac Doppler and Color Doppler Indications:    Sick sinus syndrome.; R94.31 Abnormal EKG; R06.02 SOB  History:        Patient has no prior history of Echocardiogram examinations.                 Signs/Symptoms:Dyspnea, Shortness of Breath and Bacteremia; Risk                 Factors:Hypertension. Covid 19 positive. Pneumoina. Respiratory                 failure.  Sonographer:    Roseanna Rainbow RDCS Referring Phys: [id number] [place]  Sonographer Comments: Technically difficult study due to poor echo windows. Image acquisition challenging due to respiratory motion.  IMPRESSIONS  1. Left ventricular ejection fraction, by estimation, is 60 to 65%. The left ventricle has normal function. The left ventricle has no regional wall motion abnormalities. There is mild left ventricular hypertrophy. Left ventricular diastolic parameters are consistent with Grade I diastolic dysfunction (impaired relaxation).  2. Right ventricular systolic function is normal. The right ventricular size is normal. There is mildly elevated pulmonary artery systolic pressure. The estimated right ventricular systolic pressure is 16.3 mmHg.  3. The mitral valve is normal in structure and  function. Trivial mitral valve regurgitation. No evidence of mitral stenosis.  4. The aortic valve is tricuspid. Aortic valve regurgitation is not visualized. No aortic stenosis is present.  5. The inferior vena cava is dilated in size with >50% respiratory variability, suggesting right atrial pressure of 8 mmHg. FINDINGS  Left Ventricle: Left ventricular ejection fraction, by estimation, is 60 to 65%. The left ventricle has normal function. The left ventricle has no regional wall motion abnormalities. The left ventricular internal cavity size was normal in size. There is  mild left ventricular hypertrophy. Left ventricular diastolic parameters are consistent with Grade I diastolic dysfunction (impaired relaxation). Right Ventricle: The right ventricular size is normal. No increase in right ventricular wall thickness. Right ventricular systolic function is normal. There is mildly elevated pulmonary artery systolic pressure. The tricuspid regurgitant velocity is 2.59  m/s, and with an assumed right atrial pressure of 8 mmHg, the estimated right ventricular systolic pressure is 50.5 mmHg. Left Atrium: Left atrial size was normal in size. Right Atrium: Right atrial size was normal in size. Pericardium: There is no evidence of pericardial effusion. Mitral Valve: The mitral valve is normal in structure and function. Trivial mitral  valve regurgitation. No evidence of mitral valve stenosis. Tricuspid Valve: The tricuspid valve is normal in structure. Tricuspid valve regurgitation is trivial. Aortic Valve: The aortic valve is tricuspid. Aortic valve regurgitation is not visualized. No aortic stenosis is present. Pulmonic Valve: The pulmonic valve was normal in structure. Pulmonic valve regurgitation is not visualized. Aorta: The aortic root is normal in size and structure. Venous: The inferior vena cava is dilated in size with greater than 50% respiratory variability, suggesting right atrial pressure of 8 mmHg. IAS/Shunts: No atrial level shunt detected by color flow Doppler.  LEFT VENTRICLE PLAX 2D LVIDd:         3.97 cm     Diastology LVIDs:         2.71 cm     LV e' lateral:   5.77 cm/s LV PW:         1.50 cm     LV E/e' lateral: 10.5 LV IVS:        1.10 cm     LV e' medial:    6.53 cm/s LVOT diam:     1.70 cm     LV E/e' medial:  9.3 LV SV:         51.98 ml LV SV Index:   19.89 LVOT Area:     2.27 cm  LV Volumes (MOD) LV vol d, MOD A2C: 68.4 ml LV vol d, MOD A4C: 52.4 ml LV vol s, MOD A2C: 17.9 ml LV vol s, MOD A4C: 19.3 ml LV SV MOD A2C:     50.5 ml LV SV MOD A4C:     52.4 ml LV SV MOD BP:      44.5 ml RIGHT VENTRICLE            IVC RV S prime:     9.25 cm/s  IVC diam: 2.29 cm TAPSE (M-mode): 1.3 cm LEFT ATRIUM             Index       RIGHT ATRIUM           Index LA diam:        4.00 cm 1.94 cm/m  RA Area:     12.80 cm LA Vol (A2C):   39.2 ml 19.03 ml/m RA Volume:   28.90 ml  14.03 ml/m LA  Vol (A4C):   23.1 ml 11.21 ml/m LA Biplane Vol: 33.4 ml 16.21 ml/m  AORTIC VALVE LVOT Vmax:   108.00 cm/s LVOT Vmean:  69.100 cm/s LVOT VTI:    0.229 m  AORTA Ao Root diam: 3.60 cm Ao Asc diam:  3.30 cm MITRAL VALVE               TRICUSPID VALVE MV Area (PHT): 2.50 cm    TR Peak grad:   26.8 mmHg MV Decel Time: 303 msec    TR Vmax:        259.00 cm/s MV E velocity: 60.60 cm/s MV A velocity: 84.40 cm/s  SHUNTS MV E/A ratio:  0.72        Systemic  VTI:  0.23 m                            Systemic Diam: 1.70 cm Loralie Champagne MD Electronically signed by Loralie Champagne MD Signature Date/Time: 01/23/2020/10:57:05 AM    Final         Scheduled Meds: . carbidopa-levodopa  2 tablet Oral QHS  . carbidopa-levodopa  2 tablet Oral 3 times per day   And  . carbidopa-levodopa  1 tablet Oral QHS  . chlorhexidine  15 mL Mouth Rinse BID  . Chlorhexidine Gluconate Cloth  6 each Topical Daily  . dexamethasone (DECADRON) injection  6 mg Intravenous Q24H  . feeding supplement (ENSURE ENLIVE)  237 mL Oral TID BM  . insulin aspart  0-9 Units Subcutaneous TID WC  . mouth rinse  15 mL Mouth Rinse q12n4p  . metoprolol tartrate  50 mg Oral BID  . rOPINIRole  3 mg Oral Q24H  . sodium chloride flush  3 mL Intravenous Q12H  . sodium chloride flush  3 mL Intravenous Q12H   Continuous Infusions: . sodium chloride    . ceFEPime (MAXIPIME) IV 2 g (01/24/20 0839)  . fluconazole (DIFLUCAN) IV    . heparin 1,200 Units/hr (01/24/20 0600)     LOS: 6 days   The patient is critically ill with multiple organ systems failure and requires high complexity decision making for assessment and support, frequent evaluation and titration of therapies, application of advanced monitoring technologies and extensive interpretation of multiple databases. Critical Care Time devoted to patient care services described in this note  Time spent: 40 minutes     Jearlean Demauro, Geraldo Docker, MD [facility] Pager [phone]  If 7PM-7AM, please contact night-coverage [email or web address] Password [facility] 01/24/2020, 9:17 AM

## 2020-01-25 ENCOUNTER — Other Ambulatory Visit: Payer: Self-pay

## 2020-01-25 ENCOUNTER — Inpatient Hospital Stay (HOSPITAL_COMMUNITY): Payer: Medicare Other

## 2020-01-25 DIAGNOSIS — N19 Unspecified kidney failure: Secondary | ICD-10-CM

## 2020-01-25 DIAGNOSIS — U071 COVID-19: Secondary | ICD-10-CM

## 2020-01-25 DIAGNOSIS — J1282 Pneumonia due to coronavirus disease 2019: Secondary | ICD-10-CM

## 2020-01-25 DIAGNOSIS — N179 Acute kidney failure, unspecified: Secondary | ICD-10-CM

## 2020-01-25 DIAGNOSIS — J9601 Acute respiratory failure with hypoxia: Secondary | ICD-10-CM

## 2020-01-25 LAB — CBC WITH DIFFERENTIAL/PLATELET
Abs Immature Granulocytes: 0.99 10*3/uL — ABNORMAL HIGH (ref 0.00–0.07)
Basophils Absolute: 0.2 10*3/uL — ABNORMAL HIGH (ref 0.0–0.1)
Basophils Relative: 0 %
Eosinophils Absolute: 0 10*3/uL (ref 0.0–0.5)
Eosinophils Relative: 0 %
HCT: 49.5 % (ref 39.0–52.0)
Hemoglobin: 16.4 g/dL (ref 13.0–17.0)
Immature Granulocytes: 3 %
Lymphocytes Relative: 1 %
Lymphs Abs: 0.3 10*3/uL — ABNORMAL LOW (ref 0.7–4.0)
MCH: 31.4 pg (ref 26.0–34.0)
MCHC: 33.1 g/dL (ref 30.0–36.0)
MCV: 94.8 fL (ref 80.0–100.0)
Monocytes Absolute: 0.4 10*3/uL (ref 0.1–1.0)
Monocytes Relative: 1 %
Neutro Abs: 36.3 10*3/uL — ABNORMAL HIGH (ref 1.7–7.7)
Neutrophils Relative %: 95 %
Platelets: 135 10*3/uL — ABNORMAL LOW (ref 150–400)
RBC: 5.22 MIL/uL (ref 4.22–5.81)
RDW: 15.3 % (ref 11.5–15.5)
WBC: 38.2 10*3/uL — ABNORMAL HIGH (ref 4.0–10.5)
nRBC: 0.1 % (ref 0.0–0.2)

## 2020-01-25 LAB — COMPREHENSIVE METABOLIC PANEL
ALT: 15 U/L (ref 0–44)
AST: 92 U/L — ABNORMAL HIGH (ref 15–41)
Albumin: 3.1 g/dL — ABNORMAL LOW (ref 3.5–5.0)
Alkaline Phosphatase: 239 U/L — ABNORMAL HIGH (ref 38–126)
Anion gap: 22 — ABNORMAL HIGH (ref 5–15)
BUN: 185 mg/dL — ABNORMAL HIGH (ref 8–23)
CO2: 18 mmol/L — ABNORMAL LOW (ref 22–32)
Calcium: 7.8 mg/dL — ABNORMAL LOW (ref 8.9–10.3)
Chloride: 112 mmol/L — ABNORMAL HIGH (ref 98–111)
Creatinine, Ser: 4.11 mg/dL — ABNORMAL HIGH (ref 0.61–1.24)
GFR calc Af Amer: 15 mL/min — ABNORMAL LOW (ref >60)
GFR calc non Af Amer: 13 mL/min — ABNORMAL LOW (ref >60)
Glucose, Bld: 222 mg/dL — ABNORMAL HIGH (ref 70–99)
Potassium: 4.8 mmol/L (ref 3.5–5.1)
Sodium: 152 mmol/L — ABNORMAL HIGH (ref 135–145)
Total Bilirubin: 3.3 mg/dL — ABNORMAL HIGH (ref 0.3–1.2)
Total Protein: 6.7 g/dL (ref 6.5–8.1)

## 2020-01-25 LAB — RENAL FUNCTION PANEL
Albumin: 2.9 g/dL — ABNORMAL LOW (ref 3.5–5.0)
Anion gap: 19 — ABNORMAL HIGH (ref 5–15)
BUN: 181 mg/dL — ABNORMAL HIGH (ref 8–23)
CO2: 18 mmol/L — ABNORMAL LOW (ref 22–32)
Calcium: 7.7 mg/dL — ABNORMAL LOW (ref 8.9–10.3)
Chloride: 116 mmol/L — ABNORMAL HIGH (ref 98–111)
Creatinine, Ser: 3.97 mg/dL — ABNORMAL HIGH (ref 0.61–1.24)
GFR calc Af Amer: 16 mL/min — ABNORMAL LOW (ref >60)
GFR calc non Af Amer: 14 mL/min — ABNORMAL LOW (ref >60)
Glucose, Bld: 219 mg/dL — ABNORMAL HIGH (ref 70–99)
Phosphorus: 5.5 mg/dL — ABNORMAL HIGH (ref 2.5–4.6)
Potassium: 5.2 mmol/L — ABNORMAL HIGH (ref 3.5–5.1)
Sodium: 153 mmol/L — ABNORMAL HIGH (ref 135–145)

## 2020-01-25 LAB — URINE CULTURE: Culture: NO GROWTH

## 2020-01-25 LAB — C-REACTIVE PROTEIN: CRP: 13.1 mg/dL — ABNORMAL HIGH (ref <1.0)

## 2020-01-25 LAB — GLUCOSE, CAPILLARY
Glucose-Capillary: 181 mg/dL — ABNORMAL HIGH (ref 70–99)
Glucose-Capillary: 204 mg/dL — ABNORMAL HIGH (ref 70–99)
Glucose-Capillary: 180 mg/dL — ABNORMAL HIGH (ref 70–99)
Glucose-Capillary: 185 mg/dL — ABNORMAL HIGH (ref 70–99)

## 2020-01-25 LAB — FERRITIN: Ferritin: 2386 ng/mL — ABNORMAL HIGH (ref 24–336)

## 2020-01-25 LAB — PHOSPHORUS: Phosphorus: 6.3 mg/dL — ABNORMAL HIGH (ref 2.5–4.6)

## 2020-01-25 LAB — MAGNESIUM: Magnesium: 3.3 mg/dL — ABNORMAL HIGH (ref 1.7–2.4)

## 2020-01-25 LAB — HEPARIN INDUCED PLATELET AB (HIT ANTIBODY): Heparin Induced Plt Ab: 0.115 OD (ref 0.000–0.400)

## 2020-01-25 LAB — HEPARIN LEVEL (UNFRACTIONATED): Heparin Unfractionated: 0.36 IU/mL (ref 0.30–0.70)

## 2020-01-25 LAB — D-DIMER, QUANTITATIVE: D-Dimer, Quant: >20 ug/mL-FEU — ABNORMAL HIGH (ref 0.00–0.50)

## 2020-01-25 MED ORDER — ORAL CARE MOUTH RINSE
15.0000 mL | OROMUCOSAL | Status: DC
  Administered 2020-01-25 – 2020-01-29 (×41): 15 mL via OROMUCOSAL

## 2020-01-25 MED ORDER — DEXMEDETOMIDINE HCL IN NACL 400 MCG/100ML IV SOLN
0.4000 ug/kg/h | INTRAVENOUS | Status: DC
  Administered 2020-01-25: 0.4 ug/kg/h via INTRAVENOUS
  Administered 2020-01-26: 0.7 ug/kg/h via INTRAVENOUS
  Administered 2020-01-26: 0.6 ug/kg/h via INTRAVENOUS
  Filled 2020-01-25 (×4): qty 100

## 2020-01-25 MED ORDER — PRISMASOL BGK 4/2.5 32-4-2.5 MEQ/L REPLACEMENT SOLN: Status: DC

## 2020-01-25 MED ORDER — SODIUM CHLORIDE 0.9 % IV SOLN
2.0000 g | Freq: Two times a day (BID) | INTRAVENOUS | Status: AC
  Administered 2020-01-25 – 2020-01-29 (×8): 2 g via INTRAVENOUS
  Filled 2020-01-25 (×9): qty 2

## 2020-01-25 MED ORDER — SODIUM CHLORIDE 0.9 % FOR CRRT
INTRAVENOUS_CENTRAL | Status: DC | PRN
  Filled 2020-01-25 (×2): qty 1000

## 2020-01-25 MED ORDER — FENTANYL CITRATE (PF) 100 MCG/2ML IJ SOLN
50.0000 ug | INTRAMUSCULAR | Status: DC | PRN
  Administered 2020-01-25 – 2020-01-29 (×3): 50 ug via INTRAVENOUS
  Filled 2020-01-25 (×2): qty 2

## 2020-01-25 MED ORDER — HYDRALAZINE HCL 20 MG/ML IJ SOLN
5.0000 mg | INTRAMUSCULAR | Status: DC | PRN
  Administered 2020-01-25: 5 mg via INTRAVENOUS
  Filled 2020-01-25: qty 1

## 2020-01-25 MED ORDER — PRISMASOL BGK 4/2.5 32-4-2.5 MEQ/L IV SOLN: INTRAVENOUS | Status: DC

## 2020-01-25 MED ORDER — HEPARIN SODIUM (PORCINE) 1000 UNIT/ML DIALYSIS
1000.0000 [IU] | INTRAMUSCULAR | Status: DC | PRN
  Administered 2020-01-25: 1000 [IU] via INTRAVENOUS_CENTRAL
  Administered 2020-01-25: 2800 [IU] via INTRAVENOUS_CENTRAL
  Filled 2020-01-25 (×3): qty 6
  Filled 2020-01-25: qty 1
  Filled 2020-01-25: qty 3

## 2020-01-25 NOTE — Progress Notes (Signed)
Pharmacy Antibiotic Note  Christopher Travis is a 74 y.o. male admitted on 01/07/2020 with COVID-19 pneumonia now concerning for superimposed bacterial pneumonia.  Pharmacy dosing Cefepime. Starting CRRT today. Persistent leukocytosis. Culture negative to date   Plan: -Adjust Cefepime to 2 gm IV Q 12 hours while on CRRT  -Monitor CBC, cultures and clinical progress  Height: 5\' 11"  (180.3 cm) Weight: 187 lb 13.3 oz (85.2 kg) IBW/kg (Calculated) : 75.3  Temp (24hrs), Avg:98.6 F (37 C), Min:97.8 F (36.6 C), Max:99.4 F (37.4 C)  Recent Labs  Lab 01/21/20 0544 01/21/20 0544 01/22/20 0249 01/22/20 0249 01/22/20 2004 01/23/20 0020 01/23/20 1658 01/24/20 0204 01/25/20 0546  WBC 34.3*  --  29.6*  --   --  31.6*  --  32.8* 38.2*  CREATININE 1.25*   < > 2.15*   < > 2.51* 2.69* 3.44* 3.44* 4.11*   < > = values in this interval not displayed.    Estimated Creatinine Clearance: 16.5 mL/min (A) (by C-G formula based on SCr of 4.11 mg/dL (H)).    Allergies  Allergen Reactions  . Iodine Swelling    Antimicrobials this admission: remdesivir 2/12>>2/16 azithromycin 2/13>> 2/17 ceftraixone 2/13>> 2/17  Dose adjustments this admission:  Microbiology results: 2/17 BCx: ngtd 2/15 MRSA PCR: neg   Thank you for allowing pharmacy to be a part of this patient's care.  Albertina Parr, PharmD., BCPS Clinical Pharmacist Clinical phone for 01/25/20 until 5pm: [phone]

## 2020-01-25 NOTE — Progress Notes (Signed)
PROGRESS NOTE    Christopher Travis  ZOX:[id number] DOB: 1946/05/26 DOA: 01/13/2020 PCP: System, Pcp Not In   Brief Narrative:  Christopher Travis is an 74 y.o.  Hispanic male PMHx Parkinson's disease and non-Hodgkin's lymphoma in remission   who developed subjective fever malaise about 1 week prior to admission, started developing shortness of breath about 2 to 3 days ago at home his sats began reading 80% presented to [facility] found to be hypoxic,  Tachypneic chest x-ray showed bilateral infiltrates, SARS-CoV-2 PCR, his creatinine was 1.3 (up from baseline 1.0) elevated bili Ruben with a leukocytosis lactic acid of 4.3 procalcitonin 2.5 D-dimer 1.28 blood cultures ordered was fluid resuscitated started on IV Decadron on remdesivir.   Subjective: 2/20 afebrile overnight.  A/O x1 (does not know where, when, why) [place].  Awake following some commands   Assessment & Plan:   Principal Problem:   Pneumonia due to COVID-19 virus Active Problems:   Parkinson disease ([place])   Hypertension   AKI (acute kidney injury) ([place])   Severe sepsis ([place])   Hyperbilirubinemia   Acute respiratory failure with hypoxia ([place])   Parkinson's disease ([place])   Palliative care encounter  Covid pneumonia/Acute Respiratory Failure with hypoxia/CAP COVID-19 Labs  Recent Labs    01/23/20 0020 01/24/20 0204  DDIMER >20.00* >20.00*  FERRITIN 1,936* 423*  CRP 15.2* 10.2*    2/12 SARS coronavirus 2 positive  Covid pneumonia/acute respiratory failure with hypoxia/HCAP -2/13 Covid convalescent plasma x2 doses -Decadron 6 mg daily -Remdesivir per pharmacy protocol -2/15 Actemra x1 dose -Titrate O2 to maintain SPO2 88% if possible.  Patient appears to be happy hypoxic doing well low 80s/high 70% -Prone patient 16 hours/day; if cannot tolerate prone 2 to 3 hours per shift -2/19 patient received multiple doses of Ativan, fentanyl, morphine overnight now heavily sedated. -2/19 decrease  Fentanyl 50 mcg q 4 hr PRN---> q 6 PRN -2/19 Ativan 1 to 2 mg PRN q 4 hr (hold) -2/20 PCXR pending  Severe Sepsis -Unknown source HCAP vs bacteremia vs UTI vs unk source vs secondary to acute DVT -Leukocytosis with negative fever, bands or left shift -2/17 PCXR; worsening groundglass opacification see results below -2/18 patient's respiratory status continues to deteriorate will DC CAP coverage and start HCAP coverage x7 days.  Acute LEFT lower extremity DVT/Acute RIGHT lower extremity SVT -Treatment dose Lovenox -Unable to verify if patient has PE spoke with daughter who confirms has true iodine allergy, therefore no CTA PE protocol.  Chronic diastolic CHF -Strict in and out +7 2 L -Daily weight Filed Weights   01/18/20 0400 01/19/20 0419 01/25/20 0500  Weight: 85.8 kg 85.9 kg 85.2 kg  -D5W 85m/hr  Essential hypertension: -Holding antihypertensive medication due to the sepsis picture. -Patient has had multiple episode of tachycardia/bradycardia.  Sick sinus syndrome?  Will obtain echocardiogram -Echocardiogram positive for chronic diastolic CHF see results below  Sick sinus syndrome? -Bradycardia has resolved therefore rules out sick sinus syndrome.  Chronic diastolic CHF see above  Acute kidney injury: Recent Labs  Lab 01/22/20 2004 01/23/20 0020 01/23/20 1658 01/24/20 0204 01/25/20 0546  CREATININE 2.51* 2.69* 3.44* 3.44* 4.11*  -See CHF -2/18 renal ultrasound nondiagnostic see results below -2/18 Lasix IV 60 mg x 1 -Patient's renal status has continued to worsen patient now uremic.  Spoke with Dr. LRodman [facility] about initiating CRRT.  She concurs patient would benefit from CRRT.   Hyperbilirubinemia Abdominal exam is benign, now resolved.  Parkinson's disease -Continue home medication.    Thrush -Fluconazole  Hypernatremia -See CHF   Goals of care/ethics: I had a long discussion with the family the patient on 01/19/2020, expressed that he did  not want to be intubated or resuscitated this was expressed to the family.  I have expressed to the family my concerns about his extremely poor prognosis they would like to talk it over and give it a few more hours to see if he turns around to have explained to him that it is unlikely but we would do as they wish.  We will call him again this afternoon. -2/17 consult palliative care; patient with extremely poor prognosis family does not want to escalate care.  Comfort care vs home hospice vs residential hospice  -2/20 again spoke with family who continues to push for reversal of DNR.  Again informed family that if they could convince patient to reverse DNR then we will honor that request.  Unfortunately currently patient uremic A/O x1, unable to have an in-depth conversation with family concerning reversal of his previous DNR request.  [place] (daughter) is adamant that father did not understand when he made himself DNR because his native language is Spanish, however Dr. Tammi Klippel physician who explained CODE STATUS to patient native language is Spanish.     DVT prophylaxis: Lovenox treatment dose Code Status: DNR Family Communication: 2/20 Advised daughter Christopher Travis (daughter) and family renal status has continued to deteriorate and need to begin CRRT.  Answered all questions  Disposition Plan: TBD   Consultants:  Dr. Rodman [facility] nephrology  PCCM Dr. Lake Bells    Procedures/Significant Events:  2/13 Covid convalescent plasma x2 doses 2/15 Actemra x1 dose 2/16 bilateral lower extremity Doppler;RIGHT: Positive acute SVT involving the  right great saphenous vein, and right varicosities or other superficial veins.  LEFT: Positive acute DVT involving the left gastrocnemius veins.  2/17 PCXR; low lung volume with worsening of LEFT>> RIGHT interstitial and ground-glass opacities concerning for bilateral pneumonia 2/18 echocardiogram;.Left Ventricle: EF= 60 -65%. The left ventricle has normal function.  The left ventricle has no regional wall motion abnormalities.  -Grade I diastolic dysfunction (impaired relaxation).  -There is mildly elevated pulmonary artery systolic pressure.    2/19 renal US; No acute or focal abnormality identified. No hydronephrosis. 4 cm simple cyst left kidney    I have personally reviewed and interpreted all radiology studies and my findings are as above.  VENTILATOR SETTINGS: HHFNC 2/20 FiO2 100% Flow; 40 L/min SPO2; 94%   Cultures 2/12 blood 1/2 positive coag negative staph most likely contaminant 2/12 SARS coronavirus 2 positive 2/17 sputum pending 2/17 Blood RIGHT AC NGTD 2/17 blood LEFT AC NGTD 2/19 urine pending 2/20 acute hepatitis panel pending 2/20 HIV panel pending   Antimicrobials: Anti-infectives (From admission, onward)   Start     Dose/Rate Stop   01/23/20 0800  ceFEPIme (MAXIPIME) 2 g in sodium chloride 0.9 % 100 mL IVPB     2 g 200 mL/hr over 30 Minutes     01/18/20 1000  remdesivir 100 mg in sodium chloride 0.9 % 100 mL IVPB     100 mg 200 mL/hr over 30 Minutes 01/21/20 1106   01/18/20 0400  azithromycin (ZITHROMAX) 500 mg in sodium chloride 0.9 % 250 mL IVPB     500 mg 250 mL/hr over 60 Minutes 01/22/20 0422   01/18/20 0300  cefTRIAXone (ROCEPHIN) 2 g in sodium chloride 0.9 % 100 mL IVPB     2 g  200 mL/hr over 30 Minutes 01/22/20 0317   01/10/2020 2045  remdesivir 100 mg in sodium chloride 0.9 % 100 mL IVPB     100 mg 200 mL/hr over 30 Minutes 01/20/2020 2232       Devices   LINES      Continuous Infusions: . sodium chloride    . ceFEPime (MAXIPIME) IV Stopped (01/24/20 1138)  . dextrose 50 mL/hr at 01/24/20 1300  . fluconazole (DIFLUCAN) IV 100 mg (01/24/20 1112)  . heparin 1,200 Units/hr (01/24/20 1300)     Objective: Vitals:   01/25/20 0545 01/25/20 0600 01/25/20 0615 01/25/20 0630  BP:  (!) 127/110  (!) 153/86  Pulse: (!) 134 84 (!) 133 88  Resp: 19 19 (!) 21 (!) 24  Temp:      TempSrc:      SpO2:  90% 93% 93% (!) 87%  Weight:      Height:        Intake/Output Summary (Last 24 hours) at 01/25/2020 0730 Last data filed at 01/25/2020 0600 Gross per 24 hour  Intake 593.18 ml  Output 400 ml  Net 193.18 ml   Filed Weights   01/18/20 0400 01/19/20 0419 01/25/20 0500  Weight: 85.8 kg 85.9 kg 85.2 kg   Physical Exam:  General: A/O x1 (does not know where, when, why) positive acute respiratory distress Eyes: negative scleral hemorrhage, negative anisocoria, negative icterus ENT: Negative Runny nose, negative gingival bleeding, Neck:  Negative scars, masses, torticollis, lymphadenopathy, JVD Lungs: Tachypneic decreased breath sounds bilaterally without wheezes or crackles Cardiovascular: Tachycardic without murmur gallop or rub normal S1 and S2 Abdomen: negative abdominal pain, nondistended, positive soft, bowel sounds, no rebound, no ascites, no appreciable mass Extremities: No significant cyanosis, clubbing, or edema bilateral lower extremities Skin: Negative rashes, lesions, ulcers Psychiatric: Unable to evaluate secondary to altered mental status most likely secondary to uremia Central nervous system:  Cranial nerves II through XII intact, tongue/uvula midline, follows some commands      Data Reviewed: Care during the described time interval was provided by me .  I have reviewed this patient's available data, including medical history, events of note, physical examination, and all test results as part of my evaluation.   CBC: Recent Labs  Lab 01/21/20 0544 01/22/20 0249 01/23/20 0020 01/24/20 0204 01/25/20 0546  WBC 34.3* 29.6* 31.6* 32.8* 38.2*  NEUTROABS 31.8* 27.9* 28.8* 31.3* PENDING  HGB 17.5* 16.1 16.5 15.3 16.4  HCT 51.8 47.6 48.0 45.0 49.5  MCV 94.0 93.9 93.2 93.8 94.8  PLT 251 PLATELET CLUMPS NOTED ON SMEAR, COUNT APPEARS DECREASED 125* 110* 578*   Basic Metabolic Panel: Recent Labs  Lab 01/19/20 0225 01/20/20 0134 01/22/20 0249 01/22/20 2004  01/23/20 0020 01/23/20 1658 01/24/20 0204  NA 139   < > 147* 147* 148* 152* 151*  K 3.6   < > 4.8 5.0 4.6 4.1 4.3  CL 106   < > 111 108 108 111 111  CO2 20*   < > 16* 18* 22 21* 22  GLUCOSE 207*   < > 211* 208* 205* 195* 196*  BUN 35*   < > 90* 113* 114* 150* 147*  CREATININE 1.12   < > 2.15* 2.51* 2.69* 3.44* 3.44*  CALCIUM 8.5*   < > 8.4* 8.3* 8.4* 8.1* 8.0*  MG 1.8  --   --  2.6* 2.6* 2.7* 2.6*  PHOS  --   --   --   --  4.7*  --  6.7*   < > =  values in this interval not displayed.   GFR: Estimated Creatinine Clearance: 19.8 mL/min (A) (by C-G formula based on SCr of 3.44 mg/dL (H)). Liver Function Tests: Recent Labs  Lab 01/20/20 0134 01/21/20 0544 01/22/20 0249 01/23/20 0020 01/24/20 0204  AST 57* 66* 60* 58* 39  ALT 9 14 10 7 9   ALKPHOS 147* 195* 230* 209* 202*  BILITOT 0.7 1.2 1.6* 1.6* 1.7*  PROT 6.7 7.2 6.3* 6.1* 5.8*  ALBUMIN 3.3* 3.1* 2.9* 3.0* 2.9*   No results for input(s): LIPASE, AMYLASE in the last 168 hours. No results for input(s): AMMONIA in the last 168 hours. Coagulation Profile: No results for input(s): INR, PROTIME in the last 168 hours. Cardiac Enzymes: No results for input(s): CKTOTAL, CKMB, CKMBINDEX, TROPONINI in the last 168 hours. BNP (last 3 results) No results for input(s): PROBNP in the last 8760 hours. HbA1C: No results for input(s): HGBA1C in the last 72 hours. CBG: Recent Labs  Lab 01/24/20 0811 01/24/20 1221 01/24/20 1609 01/24/20 2019 01/25/20 0708  GLUCAP 189* 172* 196* 181* 204*   Lipid Profile: No results for input(s): CHOL, HDL, LDLCALC, TRIG, CHOLHDL, LDLDIRECT in the last 72 hours. Thyroid Function Tests: No results for input(s): TSH, T4TOTAL, FREET4, T3FREE, THYROIDAB in the last 72 hours. Anemia Panel: Recent Labs    01/23/20 0020 01/24/20 0204  FERRITIN 1,936* 423*   Urine analysis: No results found for: COLORURINE, APPEARANCEUR, LABSPEC, PHURINE, GLUCOSEU, HGBUR, BILIRUBINUR, KETONESUR, PROTEINUR,  UROBILINOGEN, NITRITE, LEUKOCYTESUR Sepsis Labs: @LABRCNTIP (procalcitonin:4,lacticidven:4)  ) Recent Results (from the past 240 hour(s))  Blood culture (routine x 2)     Status: Abnormal   Collection Time: 01/07/2020  7:25 PM   Specimen: BLOOD  Result Value Ref Range Status   Specimen Description   Final    BLOOD BLOOD LEFT FOREARM Performed at [facility], [address] [place], [place] [address]    Special Requests   Final    BOTTLES DRAWN AEROBIC AND ANAEROBIC Blood Culture adequate volume Performed at [facility], [address] [place], [place] [address]    Culture  Setup Time   Final    GRAM POSITIVE COCCI AEROBIC BOTTLE ONLY CRITICAL RESULT CALLED TO, READ BACK BY AND VERIFIED WITH: PHARMD MAY BALL @1501  01/18/20 AKT    Culture (A)  Final    STAPHYLOCOCCUS SPECIES (COAGULASE NEGATIVE) THE SIGNIFICANCE OF ISOLATING THIS ORGANISM FROM A SINGLE SET OF BLOOD CULTURES WHEN MULTIPLE SETS ARE DRAWN IS UNCERTAIN. PLEASE NOTIFY THE MICROBIOLOGY DEPARTMENT WITHIN ONE WEEK IF SPECIATION AND SENSITIVITIES ARE REQUIRED. Performed at [facility], [place] [address], [place], [place] [address]    Report Status 01/19/2020 FINAL  Final  Blood culture (routine x 2)     Status: None   Collection Time: 01/13/2020  7:54 PM   Specimen: BLOOD  Result Value Ref Range Status   Specimen Description   Final    BLOOD RIGHT ANTECUBITAL Performed at [facility], [address] [place], [place] [address]    Special Requests   Final    BOTTLES DRAWN AEROBIC AND ANAEROBIC Blood Culture adequate volume Performed at [facility], [address] [place], [place] [address]    Culture   Final    NO GROWTH 5 DAYS Performed at [facility], [place] [address], [place], [place] [address]    Report Status 01/22/2020 FINAL  Final  SARS Coronavirus 2 Ag (30 min TAT) - Nasal  Swab (BD Veritor Kit)     Status: Abnormal   Collection Time: 02/01/2020  7:59 PM    Specimen: Nasal Swab (BD Veritor Kit)  Result Value Ref Range Status   SARS Coronavirus 2 Ag POSITIVE (A) NEGATIVE Final    Comment: RESULT CALLED TO, READ BACK BY AND VERIFIED WITH: KELLY NEAL RN @2034  01/11/2020 OLSONM (NOTE) SARS-CoV-2 antigen PRESENT. Positive results indicate the presence of viral antigens, but clinical correlation with patient history and other diagnostic information is necessary to determine patient infection status.  Positive results do not rule out bacterial infection or co-infection  with other viruses. False positive results are rare but can occur, and confirmatory RT-PCR testing may be appropriate in some circumstances. The expected result is Negative. Fact Sheet for Patients: PodPark.tn Fact Sheet for Providers: GiftContent.is  This test is not yet approved or cleared by the [place] FDA and  has been authorized for detection and/or diagnosis of SARS-CoV-2 by FDA under an Emergency Use Authorization (EUA).  This EUA will remain in effect (meaning this test can be used) for the duration of  the COVID-19 decla ration under Section 564(b)(1) of the Act, 21 U.S.C. section 360bbb-3(b)(1), unless the authorization is terminated or revoked sooner. Performed at [facility], [address] [place], [place] [address]   MRSA PCR Screening     Status: None   Collection Time: 01/20/20  9:40 AM   Specimen: Nasopharyngeal  Result Value Ref Range Status   MRSA by PCR NEGATIVE NEGATIVE Final    Comment:        The GeneXpert MRSA Assay (FDA approved for NASAL specimens only), is one component of a comprehensive MRSA colonization surveillance program. It is not intended to diagnose MRSA infection nor to guide or monitor treatment for MRSA infections. Performed at [facility], [place] [address] [place], [place] [address]   Culture, blood (routine x 2)     Status: None  (Preliminary result)   Collection Time: 01/22/20  7:54 PM   Specimen: BLOOD  Result Value Ref Range Status   Specimen Description BLOOD RIGHT ANTECUBITAL  Final   Special Requests   Final    BOTTLES DRAWN AEROBIC AND ANAEROBIC Blood Culture adequate volume Performed at [facility], [place] [address] [place], [place] [address]    Culture NO GROWTH 3 DAYS  Final   Report Status PENDING  Incomplete  Culture, blood (routine x 2)     Status: None (Preliminary result)   Collection Time: 01/22/20  8:04 PM   Specimen: BLOOD  Result Value Ref Range Status   Specimen Description BLOOD LEFT ANTECUBITAL  Final   Special Requests   Final    BOTTLES DRAWN AEROBIC ONLY Blood Culture results may not be optimal due to an inadequate volume of blood received in culture bottles Performed at [place] [address], [place], [place] [address]    Culture NO GROWTH 3 DAYS  Final   Report Status PENDING  Incomplete  MRSA PCR Screening     Status: None   Collection Time: 01/23/20  9:20 AM   Specimen: Nasal Mucosa; Nasopharyngeal  Result Value Ref Range Status   MRSA by PCR NEGATIVE NEGATIVE Final    Comment:        The GeneXpert MRSA Assay (FDA approved for NASAL specimens only), is one component of a comprehensive MRSA colonization surveillance program. It is not intended to diagnose MRSA infection nor to guide or  monitor treatment for MRSA infections. Performed at [facility], [place] [address], [place], [place] [address]          Radiology Studies: DG Abd 1 View  Result Date: 01/24/2020 CLINICAL DATA:  Encounter for nasogastric tube placement. EXAM: ABDOMEN - 1 VIEW COMPARISON:  Subsequent chest radiograph FINDINGS: No enteric tube is visualized in the abdomen. The tube is coiled in the esophagus on concurrent chest radiograph. Air throughout nondilated bowel loops in the central abdomen. No evidence of obstruction. IMPRESSION: No enteric tube visualized in  the abdomen. The tube is coiled in the esophagus on subsequent chest radiograph. Electronically Signed   By: Keith Rake M.D.   On: 01/24/2020 23:24   US RENAL  Result Date: 01/23/2020 CLINICAL DATA:  Acute renal failure. EXAM: RENAL / URINARY TRACT ULTRASOUND COMPLETE COMPARISON:  No prior. FINDINGS: Right Kidney: Renal measurements: 12.1 x 6.5 x 4.8 cm = volume: 196 mL . Echogenicity within normal limits. No mass or hydronephrosis visualized. Left Kidney: Renal measurements: 11.5 x 4.8 x 6.7 cm = volume: 194 mL. Echogenicity within normal limits. 4 cm simple cyst. No hydronephrosis visualized. Bladder: Appears normal for degree of bladder distention. Other: None. IMPRESSION: No acute or focal abnormality identified. No hydronephrosis. 4 cm simple cyst left kidney. Electronically Signed   By: Marcello Moores  Register   On: 01/23/2020 10:39   DG CHEST PORT 1 VIEW  Result Date: 01/24/2020 CLINICAL DATA:  Pneumonia. Orogastric tube placement. EXAM: PORTABLE CHEST 1 VIEW COMPARISON:  Chest radiograph 01/21/2019 FINDINGS: The enteric tube is coiled in the mid upper esophagus, tip not included in the field of view. Low lung volumes with diffuse heterogeneous bilateral lung opacities, not significantly changed. Unchanged heart size and mediastinal contours. No pneumothorax or large pleural effusion. IMPRESSION: 1. Enteric tube coiled in the mid upper esophagus, tip not included in the field of view. Recommend repositioning. 2. Unchanged diffuse heterogeneous bilateral lung opacities consistent with COVID pneumonia Electronically Signed   By: Keith Rake M.D.   On: 01/24/2020 23:29   ECHOCARDIOGRAM COMPLETE  Result Date: 01/23/2020    ECHOCARDIOGRAM REPORT   Patient Name:   Christopher Travis Date of Exam: 01/23/2020 Medical Rec #:  [id number]  Height:       71.0 in Accession #:    [id number] Weight:       189.4 lb Date of Birth:  March 05, 1946   BSA:          2.06 m Patient Age:    71 years   BP:           120/75 mmHg  Patient Gender: M          HR:           74 bpm. Exam Location:  Inpatient Procedure: 2D Echo, Cardiac Doppler and Color Doppler Indications:    Sick sinus syndrome.; R94.31 Abnormal EKG; R06.02 SOB  History:        Patient has no prior history of Echocardiogram examinations.                 Signs/Symptoms:Dyspnea, Shortness of Breath and Bacteremia; Risk                 Factors:Hypertension. Covid 19 positive. Pneumoina. Respiratory                 failure.  Sonographer:    Roseanna Rainbow RDCS Referring Phys: [id number] [place]  Sonographer Comments: Technically difficult study due to poor echo  windows. Image acquisition challenging due to respiratory motion. IMPRESSIONS  1. Left ventricular ejection fraction, by estimation, is 60 to 65%. The left ventricle has normal function. The left ventricle has no regional wall motion abnormalities. There is mild left ventricular hypertrophy. Left ventricular diastolic parameters are consistent with Grade I diastolic dysfunction (impaired relaxation).  2. Right ventricular systolic function is normal. The right ventricular size is normal. There is mildly elevated pulmonary artery systolic pressure. The estimated right ventricular systolic pressure is 14.7 mmHg.  3. The mitral valve is normal in structure and function. Trivial mitral valve regurgitation. No evidence of mitral stenosis.  4. The aortic valve is tricuspid. Aortic valve regurgitation is not visualized. No aortic stenosis is present.  5. The inferior vena cava is dilated in size with >50% respiratory variability, suggesting right atrial pressure of 8 mmHg. FINDINGS  Left Ventricle: Left ventricular ejection fraction, by estimation, is 60 to 65%. The left ventricle has normal function. The left ventricle has no regional wall motion abnormalities. The left ventricular internal cavity size was normal in size. There is  mild left ventricular hypertrophy. Left ventricular diastolic parameters are consistent with Grade I  diastolic dysfunction (impaired relaxation). Right Ventricle: The right ventricular size is normal. No increase in right ventricular wall thickness. Right ventricular systolic function is normal. There is mildly elevated pulmonary artery systolic pressure. The tricuspid regurgitant velocity is 2.59  m/s, and with an assumed right atrial pressure of 8 mmHg, the estimated right ventricular systolic pressure is 82.9 mmHg. Left Atrium: Left atrial size was normal in size. Right Atrium: Right atrial size was normal in size. Pericardium: There is no evidence of pericardial effusion. Mitral Valve: The mitral valve is normal in structure and function. Trivial mitral valve regurgitation. No evidence of mitral valve stenosis. Tricuspid Valve: The tricuspid valve is normal in structure. Tricuspid valve regurgitation is trivial. Aortic Valve: The aortic valve is tricuspid. Aortic valve regurgitation is not visualized. No aortic stenosis is present. Pulmonic Valve: The pulmonic valve was normal in structure. Pulmonic valve regurgitation is not visualized. Aorta: The aortic root is normal in size and structure. Venous: The inferior vena cava is dilated in size with greater than 50% respiratory variability, suggesting right atrial pressure of 8 mmHg. IAS/Shunts: No atrial level shunt detected by color flow Doppler.  LEFT VENTRICLE PLAX 2D LVIDd:         3.97 cm     Diastology LVIDs:         2.71 cm     LV e' lateral:   5.77 cm/s LV PW:         1.50 cm     LV E/e' lateral: 10.5 LV IVS:        1.10 cm     LV e' medial:    6.53 cm/s LVOT diam:     1.70 cm     LV E/e' medial:  9.3 LV SV:         51.98 ml LV SV Index:   19.89 LVOT Area:     2.27 cm  LV Volumes (MOD) LV vol d, MOD A2C: 68.4 ml LV vol d, MOD A4C: 52.4 ml LV vol s, MOD A2C: 17.9 ml LV vol s, MOD A4C: 19.3 ml LV SV MOD A2C:     50.5 ml LV SV MOD A4C:     52.4 ml LV SV MOD BP:      44.5 ml RIGHT VENTRICLE  IVC RV S prime:     9.25 cm/s  IVC diam: 2.29 cm TAPSE  (M-mode): 1.3 cm LEFT ATRIUM             Index       RIGHT ATRIUM           Index LA diam:        4.00 cm 1.94 cm/m  RA Area:     12.80 cm LA Vol (A2C):   39.2 ml 19.03 ml/m RA Volume:   28.90 ml  14.03 ml/m LA Vol (A4C):   23.1 ml 11.21 ml/m LA Biplane Vol: 33.4 ml 16.21 ml/m  AORTIC VALVE LVOT Vmax:   108.00 cm/s LVOT Vmean:  69.100 cm/s LVOT VTI:    0.229 m  AORTA Ao Root diam: 3.60 cm Ao Asc diam:  3.30 cm MITRAL VALVE               TRICUSPID VALVE MV Area (PHT): 2.50 cm    TR Peak grad:   26.8 mmHg MV Decel Time: 303 msec    TR Vmax:        259.00 cm/s MV E velocity: 60.60 cm/s MV A velocity: 84.40 cm/s  SHUNTS MV E/A ratio:  0.72        Systemic VTI:  0.23 m                            Systemic Diam: 1.70 cm Loralie Champagne MD Electronically signed by Loralie Champagne MD Signature Date/Time: 01/23/2020/10:57:05 AM    Final         Scheduled Meds: . carbidopa-levodopa  2 tablet Oral QHS  . carbidopa-levodopa  2 tablet Oral 3 times per day   And  . carbidopa-levodopa  1 tablet Oral QHS  . chlorhexidine  15 mL Mouth Rinse BID  . Chlorhexidine Gluconate Cloth  6 each Topical Daily  . dexamethasone (DECADRON) injection  6 mg Intravenous Q24H  . feeding supplement (ENSURE ENLIVE)  237 mL Oral TID BM  . insulin aspart  0-9 Units Subcutaneous TID WC  . mouth rinse  15 mL Mouth Rinse q12n4p  . metoprolol tartrate  50 mg Oral BID  . rOPINIRole  3 mg Oral Q24H  . sodium chloride flush  3 mL Intravenous Q12H  . sodium chloride flush  3 mL Intravenous Q12H   Continuous Infusions: . sodium chloride    . ceFEPime (MAXIPIME) IV Stopped (01/24/20 1138)  . dextrose 50 mL/hr at 01/24/20 1300  . fluconazole (DIFLUCAN) IV 100 mg (01/24/20 1112)  . heparin 1,200 Units/hr (01/24/20 1300)     LOS: 7 days   The patient is critically ill with multiple organ systems failure and requires high complexity decision making for assessment and support, frequent evaluation and titration of therapies, application  of advanced monitoring technologies and extensive interpretation of multiple databases. Critical Care Time devoted to patient care services described in this note  Time spent: 40 minutes     Reade Trefz, Geraldo Docker, MD [facility] Pager [phone]  If 7PM-7AM, please contact night-coverage [email or web address] Password [email or web address] 01/25/2020, 7:30 AM

## 2020-01-25 NOTE — Progress Notes (Signed)
Primary RN Cecille Rubin consulted with Probation officer who is charge nurse on shift with help placing NG tube. Patient is not awake enough to successfully initiate this and per Dr Sherral Hammers note patient should be awake before NG can be placed. Meds on hold. Will attempt insertion when patient is awake.

## 2020-01-25 NOTE — Procedures (Signed)
Central Venous Catheter Insertion Procedure Note [place] [id number] March 19, 1946  Procedure: Insertion of Central Venous Catheter Indications: Acute Kidney Injury need for hemodialysis   Procedure Details Consent: Risks of procedure as well as the alternatives and risks of each were explained to the (patient/caregiver).  Consent for procedure obtained. Time Out: Verified patient identification, verified procedure, site/side was marked, verified correct patient position, special equipment/implants available, medications/allergies/relevent history reviewed, required imaging and test results available.  Performed  Maximum sterile technique was used including antiseptics, cap, gloves, gown, hand hygiene, mask and sheet. Skin prep: Chlorhexidine; local anesthetic administered A antimicrobial bonded/coated triple lumen catheter was placed in the left internal jugular vein using the Seldinger technique.  Ultrasound was used to verify the patency of the vein and for real time needle guidance.  Evaluation Blood flow good Complications: No apparent complications Patient did tolerate procedure well. Chest X-ray ordered to verify placement.  CXR: pending.  Roselie Awkward 01/25/2020, 12:56 PM

## 2020-01-25 NOTE — Consult Note (Addendum)
Christ[facility] [place]  HISTORY AND PHYSICAL  Christopher [place] is an 74 y.o. male.    Chief Complaint: COVID infection  HPI: Pt is a 11M with a PMH sig for HTN, non-Hodgkin's lymphoma, and Parkinson's disease who is now seen in consultation at the request of Dr. Sherral [place] for eval and recs re: AKI, hyperkalemia, and hypernatremia.    Briefly, pt presented to the hospital 2/21 with one week of f/c, malaise, cough.  Pt was found to have COVID infection and was transferred to [facility].  Treated with IV steroids/ remdesivir.  Significant concern for PE and pt has true iodine allergy so CTA wasn't pursued.  On hep gtt.  On cefepime and fluconazole.  Noted the last several days pt's respiratory status has been declining.  Sats struggling to recover, he is increasingly agitated and requiring multiple doses of sedatives and anxiolytics.  Palliative care has been consulted, pt has made himself a DNR.    Cr trend as follows: 1.2-->2.1-->2.5-->2.6-->3.44-->4.11.  Na climbing and is up to 152, BUN is up to 185.  K is OK.    PMH: Past Medical History:  Diagnosis Date  . Hypertension   . Non Hodgkin's lymphoma ([place])   . Parkinson disease ([place])    PSH: History reviewed. No pertinent surgical history.  Past Medical History:  Diagnosis Date  . Hypertension   . Non Hodgkin's lymphoma ([place])   . Parkinson disease ([place])     Medications:   Scheduled: . carbidopa-levodopa  2 tablet Oral QHS  . carbidopa-levodopa  2 tablet Oral 3 times per day   And  . carbidopa-levodopa  1 tablet Oral QHS  . chlorhexidine  15 mL Mouth Rinse BID  . Chlorhexidine Gluconate Cloth  6 each Topical Daily  . dexamethasone (DECADRON) injection  6 mg Intravenous Q24H  . feeding supplement (ENSURE ENLIVE)  237 mL Oral TID BM  . insulin aspart  0-9 Units Subcutaneous TID WC  . mouth rinse  15 mL Mouth Rinse q12n4p  . metoprolol tartrate  50 mg Oral BID  . rOPINIRole  3 mg Oral Q24H  . sodium chloride flush  3 mL Intravenous Q12H  .  sodium chloride flush  3 mL Intravenous Q12H    Medications Prior to Admission  Medication Sig Dispense Refill  . amLODipine-valsartan (EXFORGE) 5-320 MG tablet Take 1 tablet by mouth daily.    . carbidopa-levodopa (SINEMET CR) 50-200 MG tablet Take 2 tablets by mouth daily at 10 pm.    . carbidopa-levodopa (SINEMET IR) 25-100 MG tablet Take 1-2 tablets by mouth See admin instructions. Take 2 tablets by mouth at 0700, 2 tablets by mouth at noon, take 2 tablets by mouth at 1700, & take 1 tablet at 2200    . Cholecalciferol (VITAMIN D3 PO) Take 1 tablet by mouth daily.    . Cyanocobalamin (VITAMIN B-12 PO) Take 1 tablet by mouth daily.    [place] rOPINIRole ([place]) 3 MG tablet Take 3 mg by mouth daily. (0700)      ALLERGIES:   Allergies  Allergen Reactions  . Iodine Swelling    FAM HX: Family History  Problem Relation Age of Onset  . Liver cancer Mother     Social History:   reports that he has never smoked. He has never used smokeless tobacco. He reports previous alcohol use. He reports that he does not use drugs.  ROS: ROS unobtainable, seen remotely  Blood pressure (!) 152/82, pulse 76, temperature 97.8 F (36.6 C), resp. rate [place])  22, height 5\' 11"  (1.803 m), weight 85.2 kg, SpO2 97 %. PHYSICAL EXAM: Physical Exam  Seen remotely d/t COVID, observations taken from PMD and primary RN in formulating plan   Results for orders placed or performed during the hospital encounter of 01/18/2020 (from the past 48 hour(s))  Glucose, capillary     Status: Abnormal   Collection Time: 01/23/20 12:09 PM  Result Value Ref Range   Glucose-Capillary 247 (H) 70 - 99 mg/dL  Basic metabolic panel     Status: Abnormal   Collection Time: 01/23/20  4:58 PM  Result Value Ref Range   Sodium 152 (H) 135 - 145 mmol/L   Potassium 4.1 3.5 - 5.1 mmol/L   Chloride 111 98 - 111 mmol/L   CO2 21 (L) 22 - 32 mmol/L   Glucose, Bld 195 (H) 70 - 99 mg/dL   BUN 150 (H) 8 - 23 mg/dL    Comment: RESULTS CONFIRMED  BY MANUAL DILUTION   Creatinine, Ser 3.44 (H) 0.61 - 1.24 mg/dL   Calcium 8.1 (L) 8.9 - 10.3 mg/dL   GFR calc non Af Amer 16 (L) >60 mL/min   GFR calc Af Amer 19 (L) >60 mL/min   Anion gap 20 (H) 5 - 15    Comment: Performed at [place] [address], [place], [place] [address]  Magnesium     Status: Abnormal   Collection Time: 01/23/20  4:58 PM  Result Value Ref Range   Magnesium 2.7 (H) 1.7 - 2.4 mg/dL    Comment: Performed at [place] [address], [place], [place] [address]  Glucose, capillary     Status: Abnormal   Collection Time: 01/23/20  5:40 PM  Result Value Ref Range   Glucose-Capillary 168 (H) 70 - 99 mg/dL  Glucose, capillary     Status: Abnormal   Collection Time: 01/23/20  9:13 PM  Result Value Ref Range   Glucose-Capillary 157 (H) 70 - 99 mg/dL  D-dimer, quantitative (not at [facility] [place])     Status: Abnormal   Collection Time: 01/24/20  2:04 AM  Result Value Ref Range   D-Dimer, Quant >20.00 (H) 0.00 - 0.50 ug/mL-FEU    Comment: (NOTE) At the manufacturer cut-off of 0.50 ug/mL FEU, this assay has been documented to exclude PE with a sensitivity and negative predictive value of 97 to 99%.  At this time, this assay has not been approved by the FDA to exclude DVT/VTE. Results should be correlated with clinical presentation. Performed at [place], [place] [address] [place], [place] [address]   C-reactive protein     Status: Abnormal   Collection Time: 01/24/20  2:04 AM  Result Value Ref Range   CRP 10.2 (H) <1.0 mg/dL    Comment: Performed at [facility], [place] [address] [place], [place] [address]  Comprehensive metabolic panel     Status: Abnormal   Collection Time: 01/24/20  2:04 AM  Result Value Ref Range   Sodium 151 (H) 135 - 145 mmol/L   Potassium 4.3 3.5 - 5.1 mmol/L   Chloride 111 98 - 111 mmol/L   CO2 22 22 - 32 mmol/L   Glucose, Bld 196 (H) 70 - 99 mg/dL   BUN 147  (H) 8 - 23 mg/dL    Comment: RESULTS CONFIRMED BY MANUAL DILUTION   Creatinine, Ser 3.44 (H) 0.61 - 1.24 mg/dL   Calcium 8.0 (L) 8.9 - 10.3 mg/dL   Total Protein 5.8 (  L) 6.5 - 8.1 g/dL   Albumin 2.9 (L) 3.5 - 5.0 g/dL   AST 39 15 - 41 U/L   ALT 9 0 - 44 U/L   Alkaline Phosphatase 202 (H) 38 - 126 U/L   Total Bilirubin 1.7 (H) 0.3 - 1.2 mg/dL   GFR calc non Af Amer 16 (L) >60 mL/min   GFR calc Af Amer 19 (L) >60 mL/min   Anion gap 18 (H) 5 - 15    Comment: Performed at [facility], [place] [address] [place], [place] [address]  CBC with Differential/Platelet     Status: Abnormal   Collection Time: 01/24/20  2:04 AM  Result Value Ref Range   WBC 32.8 (H) 4.0 - 10.5 K/uL   RBC 4.80 4.22 - 5.81 MIL/uL   Hemoglobin 15.3 13.0 - 17.0 g/dL   HCT 45.0 39.0 - 52.0 %   MCV 93.8 80.0 - 100.0 fL   MCH 31.9 26.0 - 34.0 pg   MCHC 34.0 30.0 - 36.0 g/dL   RDW 14.9 11.5 - 15.5 %   Platelets 110 (L) 150 - 400 K/uL    Comment: REPEATED TO VERIFY PLATELET COUNT CONFIRMED BY SMEAR SPECIMEN CHECKED FOR CLOTS Immature Platelet Fraction may be clinically indicated, consider ordering this additional test [id number]    nRBC 0.2 0.0 - 0.2 %   Neutrophils Relative % 96 %   Neutro Abs 31.3 (H) 1.7 - 7.7 K/uL   Lymphocytes Relative 1 %   Lymphs Abs 0.4 (L) 0.7 - 4.0 K/uL   Monocytes Relative 1 %   Monocytes Absolute 0.4 0.1 - 1.0 K/uL   Eosinophils Relative 0 %   Eosinophils Absolute 0.0 0.0 - 0.5 K/uL   Basophils Relative 0 %   Basophils Absolute 0.1 0.0 - 0.1 K/uL   WBC Morphology VACUOLATED NEUTROPHILS    Immature Granulocytes 2 %   Abs Immature Granulocytes 0.56 (H) 0.00 - 0.07 K/uL    Comment: Performed at [facility], [place] [address] [place], [place] [address]  Ferritin     Status: Abnormal   Collection Time: 01/24/20  2:04 AM  Result Value Ref Range   Ferritin 423 (H) 24 - 336 ng/mL    Comment: Performed at [facility], [place]  [address], [place], [place] [address]  Magnesium     Status: Abnormal   Collection Time: 01/24/20  2:04 AM  Result Value Ref Range   Magnesium 2.6 (H) 1.7 - 2.4 mg/dL    Comment: Performed at [facility], [place] [address] [place], [place] [address]  Phosphorus     Status: Abnormal   Collection Time: 01/24/20  2:04 AM  Result Value Ref Range   Phosphorus 6.7 (H) 2.5 - 4.6 mg/dL    Comment: Performed at [facility], [place] [address], [place], [place] [address]  Heparin level (unfractionated)     Status: Abnormal   Collection Time: 01/24/20  2:04 AM  Result Value Ref Range   Heparin Unfractionated 0.76 (H) 0.30 - 0.70 IU/mL    Comment: (NOTE) If heparin results are below expected values, and patient dosage has  been confirmed, suggest follow up testing of antithrombin III levels. Performed at [facility], [place] [address] [place], [place] [address]   Glucose, capillary     Status: Abnormal   Collection Time: 01/24/20  8:11 AM  Result Value Ref Range   Glucose-Capillary 189 (H) 70 - 99 mg/dL  Glucose,  capillary     Status: Abnormal   Collection Time: 01/24/20 12:21 PM  Result Value Ref Range   Glucose-Capillary 172 (H) 70 - 99 mg/dL  Heparin level (unfractionated)     Status: None   Collection Time: 01/24/20 12:35 PM  Result Value Ref Range   Heparin Unfractionated 0.49 0.30 - 0.70 IU/mL    Comment: (NOTE) If heparin results are below expected values, and patient dosage has  been confirmed, suggest follow up testing of antithrombin III levels. Performed at [facility], [place] [address], [place], [place] [address]   Glucose, capillary     Status: Abnormal   Collection Time: 01/24/20  4:09 PM  Result Value Ref Range   Glucose-Capillary 196 (H) 70 - 99 mg/dL  Glucose, capillary     Status: Abnormal   Collection Time: 01/24/20  8:19 PM  Result Value Ref Range   Glucose-Capillary 181 (H) 70 - 99 mg/dL   D-dimer, quantitative (not at [facility])     Status: Abnormal   Collection Time: 01/25/20  5:46 AM  Result Value Ref Range   D-Dimer, Quant >20.00 (H) 0.00 - 0.50 ug/mL-FEU    Comment: CONSISTENT WITH PREVIOUS RESULT (NOTE) At the manufacturer cut-off of 0.50 ug/mL FEU, this assay has been documented to exclude PE with a sensitivity and negative predictive value of 97 to 99%.  At this time, this assay has not been approved by the FDA to exclude DVT/VTE. Results should be correlated with clinical presentation. Performed at [facility], [place] [address] [place], [place] [address]   C-reactive protein     Status: Abnormal   Collection Time: 01/25/20  5:46 AM  Result Value Ref Range   CRP 13.1 (H) <1.0 mg/dL    Comment: Performed at [facility], [place] [address], [place], [place] [address]  Comprehensive metabolic panel     Status: Abnormal   Collection Time: 01/25/20  5:46 AM  Result Value Ref Range   Sodium 152 (H) 135 - 145 mmol/L    Comment: REPEATED TO VERIFY   Potassium 4.8 3.5 - 5.1 mmol/L   Chloride 112 (H) 98 - 111 mmol/L    Comment: REPEATED TO VERIFY   CO2 18 (L) 22 - 32 mmol/L    Comment: REPEATED TO VERIFY   Glucose, Bld 222 (H) 70 - 99 mg/dL   BUN 185 (H) 8 - 23 mg/dL    Comment: RESULTS CONFIRMED BY MANUAL DILUTION   Creatinine, Ser 4.11 (H) 0.61 - 1.24 mg/dL   Calcium 7.8 (L) 8.9 - 10.3 mg/dL   Total Protein 6.7 6.5 - 8.1 g/dL   Albumin 3.1 (L) 3.5 - 5.0 g/dL   AST 92 (H) 15 - 41 U/L   ALT 15 0 - 44 U/L   Alkaline Phosphatase 239 (H) 38 - 126 U/L   Total Bilirubin 3.3 (H) 0.3 - 1.2 mg/dL   GFR calc non Af Amer 13 (L) >60 mL/min   GFR calc Af Amer 15 (L) >60 mL/min   Anion gap 22 (H) 5 - 15    Comment: REPEATED TO VERIFY Performed at [facility], [place] [address], [place], [place] [address]   CBC with Differential/Platelet     Status: Abnormal   Collection Time: 01/25/20  5:46 AM  Result Value Ref Range    WBC 38.2 (H) 4.0 - 10.5 K/uL   RBC 5.22 4.22 - 5.81 MIL/uL   Hemoglobin 16.4 13.0 - 17.0 g/dL  HCT 49.5 39.0 - 52.0 %   MCV 94.8 80.0 - 100.0 fL   MCH 31.4 26.0 - 34.0 pg   MCHC 33.1 30.0 - 36.0 g/dL   RDW 15.3 11.5 - 15.5 %   Platelets 135 (L) 150 - 400 K/uL   nRBC 0.1 0.0 - 0.2 %   Neutrophils Relative % 95 %   Neutro Abs 36.3 (H) 1.7 - 7.7 K/uL   Lymphocytes Relative 1 %   Lymphs Abs 0.3 (L) 0.7 - 4.0 K/uL   Monocytes Relative 1 %   Monocytes Absolute 0.4 0.1 - 1.0 K/uL   Eosinophils Relative 0 %   Eosinophils Absolute 0.0 0.0 - 0.5 K/uL   Basophils Relative 0 %   Basophils Absolute 0.2 (H) 0.0 - 0.1 K/uL   WBC Morphology TOXIC GRANULATION     Comment: VACUOLATED NEUTROPHILS   Immature Granulocytes 3 %   Abs Immature Granulocytes 0.99 (H) 0.00 - 0.07 K/uL   Tear Drop Cells PRESENT    Polychromasia PRESENT     Comment: Performed at [facility], [place] [address], [place], [place] [address]  Ferritin     Status: Abnormal   Collection Time: 01/25/20  5:46 AM  Result Value Ref Range   Ferritin 2,386 (H) 24 - 336 ng/mL    Comment: Performed at [facility], [place] [address], [place], [place] [address]  Magnesium     Status: Abnormal   Collection Time: 01/25/20  5:46 AM  Result Value Ref Range   Magnesium 3.3 (H) 1.7 - 2.4 mg/dL    Comment: Performed at [facility], [place] [address], [place], [place] [address]  Phosphorus     Status: Abnormal   Collection Time: 01/25/20  5:46 AM  Result Value Ref Range   Phosphorus 6.3 (H) 2.5 - 4.6 mg/dL    Comment: Performed at [facility], [place] [address], [place], [place] [address]  Heparin level (unfractionated)     Status: None   Collection Time: 01/25/20  5:46 AM  Result Value Ref Range   Heparin Unfractionated 0.36 0.30 - 0.70 IU/mL    Comment: (NOTE) If heparin results are below expected values, and patient dosage has  been confirmed, suggest follow up  testing of antithrombin III levels. Performed at [facility], [place] [address], [place], [place] [address]   Glucose, capillary     Status: Abnormal   Collection Time: 01/25/20  7:08 AM  Result Value Ref Range   Glucose-Capillary 204 (H) 70 - 99 mg/dL    DG Abd 1 View  Result Date: 01/24/2020 CLINICAL DATA:  Encounter for nasogastric tube placement. EXAM: ABDOMEN - 1 VIEW COMPARISON:  Subsequent chest radiograph FINDINGS: No enteric tube is visualized in the abdomen. The tube is coiled in the esophagus on concurrent chest radiograph. Air throughout nondilated bowel loops in the central abdomen. No evidence of obstruction. IMPRESSION: No enteric tube visualized in the abdomen. The tube is coiled in the esophagus on subsequent chest radiograph. Electronically Signed   By: Keith Rake M.D.   On: 01/24/2020 23:24   DG CHEST PORT 1 VIEW  Result Date: 01/24/2020 CLINICAL DATA:  Pneumonia. Orogastric tube placement. EXAM: PORTABLE CHEST 1 VIEW COMPARISON:  Chest radiograph 01/21/2019 FINDINGS: The enteric tube is coiled in the mid upper esophagus, tip not included in the field of view. Low lung volumes with diffuse heterogeneous bilateral lung opacities, not significantly changed. Unchanged heart size and mediastinal contours. No  pneumothorax or large pleural effusion. IMPRESSION: 1. Enteric tube coiled in the mid upper esophagus, tip not included in the field of view. Recommend repositioning. 2. Unchanged diffuse heterogeneous bilateral lung opacities consistent with COVID pneumonia Electronically Signed   By: Keith Rake M.D.   On: 01/24/2020 23:29    Assessment/Plan  1.  Acute oliguric kidney injury: Cr up to 4.11 with BUN up to 185.  Hasn't urinated at all today.  He is lethargic (part of which is d/t meds) but uremia certainly a possibility too.  Discussed with pt's family over the phone--> in agreement with dialysis initiation.  Will do CRRT--> given hypernatremia will  be conservative.  All 4K fluids, is on systemic hep gtt, HIT antibody pending. 2.  Hypernatremia: up to 152, on D5 infusion. Will eventually correct with volume and with CRRT  3.  Acute hypoxic RF: d/t COVID and probable PE, on decadron, s/p remdesivir, on cefepime and fluconazole and hep gtt  4.  Parkinson's: on Sinemet  5.  Thrombocytopenia: plts 105, HIT antibody sent, pending  5.  Dispo: prognosis appears grim.  Palliative care involved, appreciate assistance.    Madelon Lips 01/25/2020, 10:56 AM

## 2020-01-25 NOTE — Progress Notes (Signed)
[place] for IV Heparin  Indication: DVT  Allergies  Allergen Reactions  . Iodine Swelling    Patient Measurements: Height: 5\' 11"  (180.3 cm) Weight: 187 lb 13.3 oz (85.2 kg) IBW/kg (Calculated) : 75.3  Heparin dosing weight: 85.9 kg  Vital Signs: Temp: 97.8 F (36.6 C) (02/20 0800) Temp Source: Axillary (02/20 0400) BP: 167/97 (02/20 0802) Pulse Rate: 122 (02/20 0802)  Labs: Recent Labs    01/23/20 0020 01/23/20 0805 01/23/20 1658 01/24/20 0204 01/24/20 1235 01/25/20 0546  HGB 16.5  --   --  15.3  --  16.4  HCT 48.0  --   --  45.0  --  49.5  PLT 125*  --   --  110*  --  135*  HEPARINUNFRC 0.51   < >  --  0.76* 0.49 0.36  CREATININE 2.69*  --  3.44* 3.44*  --  4.11*   < > = values in this interval not displayed.    Estimated Creatinine Clearance: 16.5 mL/min (A) (by C-G formula based on SCr of 4.11 mg/dL (H)).  Assessment: 51 YOM with COVID-19 pneumonia. Dopplers revealed an acute L-DVT and R-superficial thrombosis. Pharmacy consulted for Lovenox on 2/16, but patient now with worsening renal function. Discussed with Dr. Sherral Hammers - Will transition to IV Heparin at this time.   Heparin level is therapeutic at 0.36 on 1200 units/hr. No bleeding noted, Hgb stable, platelets are fluctuating but up to 135 today which makes HIT less likely, HIT Ab pending. SCr trending upwards.  Goal of Therapy:  Heparin level 0.3-0.7 units/ml Monitor platelets by anticoagulation protocol: Yes   Plan:  - Continue heparin drip at 1200 units/hr  - Daily heparin level, CBC - F/U HIT Ab results - Monitor for s/sx of bleeding  Thank you for involving pharmacy in this patient's care.  Renold Genta, PharmD, BCPS Clinical Pharmacist Clinical phone for 01/25/2020 until 3p is [phone] 01/25/2020 9:11 AM  **Pharmacist phone directory can be found on [email or web address] listed under [place]**

## 2020-01-25 NOTE — Progress Notes (Signed)
[facility] Progress Note Patient Name: [place] DOB: Dec 20, 1945 MRN: [id number]   Date of Service  01/25/2020  HPI/Events of Note  RN requests medication for patient's agitation. Also requests soft bilateral wrist restraints.   eICU Interventions  Precedex drip ordered. Restraints ordered to maintain patient safety.     Intervention Category Minor Interventions: Agitation / anxiety - evaluation and management;Other:  Charlott Rakes 01/25/2020, 9:28 PM

## 2020-01-25 NOTE — Progress Notes (Signed)
Occupational Therapy Treatment Patient Details Name: Christopher Travis MRN: [id number] DOB: 11-12-1946 Today's Date: 01/25/2020    History of present illness  74 y.o. male with medical history significant for Parkinson disease and non-Hodgkin lymphoma in remission, who developed subjective fevers and malaise roughly a week ago after a family member with asymptomatic COVID visited him. He went on to develop SOB 2-3 days ago, was monitoring oxygen saturations at home, began to see readings in 80's, and [facility] with acute respiratory failure with hypoxia secondary to COIVD 19 PNA and possibly CAP, sepsis, AKI, hyperbilirubinema.     OT comments  Patient has demonstrated a decline in progression toward OT goals. Nurse present in room as Spanish Interpretor and cleared for in bed activity. Nurse reports patient ripped out IV in R UE prior to therapist arrival and was in hand mittens for safety. Patient was lethargic and not consistently following one step commands. Patient was educated on relaxation techniques to incorporate with breathing techniques at [facility] verbal cue level. Patient on 40L Heated High Flow with O2 stats in 90s throughout OT tx session. Patient inconsistently participated in B LE strengthening exercises at bed level ranging from PROM to [facility]. Patient will benefit from continued skilled acute OT services.   Follow Up Recommendations  SNF;Supervision/Assistance - 24 hour    Equipment Recommendations  3 in 1 bedside commode    Recommendations for Other Services      Precautions / Restrictions Precautions Precautions: Fall;Other (comment) Precaution Comments: Parkinson's with tremors, high amounts of O2       Mobility Bed Mobility                  Transfers                 General transfer comment: Patient required A x 2 for proper positioning to top of bed. Patient was not able to follow commands to use B LE to assist with bed  transition.     Balance                                           ADL either performed or assessed with clinical judgement   ADL                                               Vision       Perception     Praxis      Cognition Arousal/Alertness: Lethargic Behavior During Therapy: Restless;Anxious;Impulsive Overall Cognitive Status: Impaired/Different from baseline Area of Impairment: Orientation;Attention;Memory;Following commands                 Orientation Level: Disoriented to;Place;Time;Situation Current Attention Level: Divided   Following Commands: Follows one step commands inconsistently                Exercises Exercises: Other exercises;General Lower Extremity(Patient ripped out IV in R UE prior to therapist arrival) General Exercises - Lower Extremity Ankle Circles/Pumps: AAROM Heel Slides: AAROM Hip ABduction/ADduction: PROM Straight Leg Raises: PROM;AAROM Other Exercises Other Exercises: (Relaxation/meditation techniques )   Shoulder Instructions       General Comments      Pertinent Vitals/ Pain       Pain Assessment: (  Not able to get a verbal response, no facial grimaces noted )  Home Living                                          Prior Functioning/Environment              Frequency           Progress Toward Goals  OT Goals(current goals can now be found in the care plan section)  Progress towards OT goals: Not progressing toward goals - comment(decline in cognitive and medical condition )     Plan      Co-evaluation                 AM-PAC OT "6 Clicks" Daily Activity     Outcome Measure   Help from another person eating meals?: Total Help from another person taking care of personal grooming?: Total Help from another person toileting, which includes using toliet, bedpan, or urinal?: Total Help from another person bathing (including washing, rinsing,  drying)?: Total Help from another person to put on and taking off regular upper body clothing?: Total Help from another person to put on and taking off regular lower body clothing?: Total 6 Click Score: 6    End of Session        Activity Tolerance Patient limited by lethargy   Patient Left in bed;with call bell/phone within reach;with nursing/sitter in room;with restraints reapplied   Nurse Communication (Decline in level of participation with therapy services )        Time: [id number] OT Time Calculation (min): 19 min  Charges: OT General Charges $OT Visit: 1 Visit OT Treatments $Therapeutic Exercise: 8-22 mins  [name]    [name] 01/25/2020, 12:18 PM

## 2020-01-26 ENCOUNTER — Other Ambulatory Visit: Payer: Self-pay

## 2020-01-26 DIAGNOSIS — G9349 Other encephalopathy: Secondary | ICD-10-CM

## 2020-01-26 LAB — GLUCOSE, CAPILLARY
Glucose-Capillary: 140 mg/dL — ABNORMAL HIGH (ref 70–99)
Glucose-Capillary: 137 mg/dL — ABNORMAL HIGH (ref 70–99)
Glucose-Capillary: 131 mg/dL — ABNORMAL HIGH (ref 70–99)
Glucose-Capillary: 128 mg/dL — ABNORMAL HIGH (ref 70–99)
Glucose-Capillary: 114 mg/dL — ABNORMAL HIGH (ref 70–99)

## 2020-01-26 LAB — RENAL FUNCTION PANEL
Albumin: 2.7 g/dL — ABNORMAL LOW (ref 3.5–5.0)
Anion gap: 12 (ref 5–15)
BUN: 65 mg/dL — ABNORMAL HIGH (ref 8–23)
CO2: 23 mmol/L (ref 22–32)
Calcium: 7.4 mg/dL — ABNORMAL LOW (ref 8.9–10.3)
Chloride: 110 mmol/L (ref 98–111)
Creatinine, Ser: 2.11 mg/dL — ABNORMAL HIGH (ref 0.61–1.24)
GFR calc Af Amer: 34 mL/min — ABNORMAL LOW (ref >60)
GFR calc non Af Amer: 30 mL/min — ABNORMAL LOW (ref >60)
Glucose, Bld: 137 mg/dL — ABNORMAL HIGH (ref 70–99)
Phosphorus: 4.1 mg/dL (ref 2.5–4.6)
Potassium: 5.2 mmol/L — ABNORMAL HIGH (ref 3.5–5.1)
Sodium: 145 mmol/L (ref 135–145)

## 2020-01-26 LAB — CBC WITH DIFFERENTIAL/PLATELET
Abs Immature Granulocytes: 1.85 10*3/uL — ABNORMAL HIGH (ref 0.00–0.07)
Basophils Absolute: 0 10*3/uL (ref 0.0–0.1)
Basophils Relative: 0 %
Eosinophils Absolute: 0 10*3/uL (ref 0.0–0.5)
Eosinophils Relative: 0 %
HCT: 44.2 % (ref 39.0–52.0)
Hemoglobin: 14.9 g/dL (ref 13.0–17.0)
Immature Granulocytes: 4 %
Lymphocytes Relative: 2 %
Lymphs Abs: 0.9 10*3/uL (ref 0.7–4.0)
MCH: 31.8 pg (ref 26.0–34.0)
MCHC: 33.7 g/dL (ref 30.0–36.0)
MCV: 94.4 fL (ref 80.0–100.0)
Monocytes Absolute: 0.7 10*3/uL (ref 0.1–1.0)
Monocytes Relative: 1 %
Neutro Abs: 43.5 10*3/uL — ABNORMAL HIGH (ref 1.7–7.7)
Neutrophils Relative %: 93 %
Platelets: 147 10*3/uL — ABNORMAL LOW (ref 150–400)
RBC: 4.68 MIL/uL (ref 4.22–5.81)
RDW: 15.6 % — ABNORMAL HIGH (ref 11.5–15.5)
WBC: 46.9 10*3/uL — ABNORMAL HIGH (ref 4.0–10.5)
nRBC: 0.3 % — ABNORMAL HIGH (ref 0.0–0.2)

## 2020-01-26 LAB — COMPREHENSIVE METABOLIC PANEL
ALT: 14 U/L (ref 0–44)
AST: 100 U/L — ABNORMAL HIGH (ref 15–41)
Albumin: 2.9 g/dL — ABNORMAL LOW (ref 3.5–5.0)
Alkaline Phosphatase: 225 U/L — ABNORMAL HIGH (ref 38–126)
Anion gap: 17 — ABNORMAL HIGH (ref 5–15)
BUN: 62 mg/dL — ABNORMAL HIGH (ref 8–23)
CO2: 22 mmol/L (ref 22–32)
Calcium: 7.7 mg/dL — ABNORMAL LOW (ref 8.9–10.3)
Chloride: 112 mmol/L — ABNORMAL HIGH (ref 98–111)
Creatinine, Ser: 2.94 mg/dL — ABNORMAL HIGH (ref 0.61–1.24)
GFR calc Af Amer: 23 mL/min — ABNORMAL LOW (ref >60)
GFR calc non Af Amer: 20 mL/min — ABNORMAL LOW (ref >60)
Glucose, Bld: 118 mg/dL — ABNORMAL HIGH (ref 70–99)
Potassium: 4.9 mmol/L (ref 3.5–5.1)
Sodium: 151 mmol/L — ABNORMAL HIGH (ref 135–145)
Total Bilirubin: 3.9 mg/dL — ABNORMAL HIGH (ref 0.3–1.2)
Total Protein: 6 g/dL — ABNORMAL LOW (ref 6.5–8.1)

## 2020-01-26 LAB — PHOSPHORUS: Phosphorus: 4.1 mg/dL (ref 2.5–4.6)

## 2020-01-26 LAB — HEPARIN LEVEL (UNFRACTIONATED): Heparin Unfractionated: 0.67 IU/mL (ref 0.30–0.70)

## 2020-01-26 LAB — D-DIMER, QUANTITATIVE: D-Dimer, Quant: 9.9 ug/mL-FEU — ABNORMAL HIGH (ref 0.00–0.50)

## 2020-01-26 LAB — C-REACTIVE PROTEIN: CRP: 9.9 mg/dL — ABNORMAL HIGH (ref <1.0)

## 2020-01-26 LAB — HIV ANTIBODY (ROUTINE TESTING W REFLEX): HIV Screen 4th Generation wRfx: NONREACTIVE — AB

## 2020-01-26 LAB — MAGNESIUM: Magnesium: 3 mg/dL — ABNORMAL HIGH (ref 1.7–2.4)

## 2020-01-26 LAB — FERRITIN: Ferritin: 2343 ng/mL — ABNORMAL HIGH (ref 24–336)

## 2020-01-26 MED ORDER — CALCIUM GLUCONATE-NACL 1-0.675 GM/50ML-% IV SOLN
1.0000 g | Freq: Once | INTRAVENOUS | Status: AC
  Administered 2020-01-26: 1000 mg via INTRAVENOUS
  Filled 2020-01-26: qty 50

## 2020-01-26 MED ORDER — CARBIDOPA-LEVODOPA 25-100 MG PO TABS
2.0000 | ORAL_TABLET | Freq: Every day | ORAL | Status: DC
  Filled 2020-01-26: qty 2

## 2020-01-26 MED ORDER — CARBIDOPA-LEVODOPA 25-100 MG PO TABS
3.0000 | ORAL_TABLET | ORAL | Status: DC
  Administered 2020-01-27 – 2020-01-29 (×7): 3
  Filled 2020-01-26 (×12): qty 3

## 2020-01-26 MED ORDER — CARBIDOPA-LEVODOPA 25-100 MG PO TABS: 2.0000 | ORAL_TABLET | ORAL | Status: DC

## 2020-01-26 MED ORDER — CARBIDOPA-LEVODOPA 25-100 MG PO TABS: 1.0000 | ORAL_TABLET | Freq: Every day | ORAL | Status: DC

## 2020-01-26 MED ORDER — CARBIDOPA-LEVODOPA 25-100 MG PO TABS
3.0000 | ORAL_TABLET | ORAL | Status: DC
  Filled 2020-01-26: qty 3

## 2020-01-26 MED ORDER — CARBIDOPA-LEVODOPA 25-100 MG PO TABS
2.0000 | ORAL_TABLET | Freq: Every day | ORAL | Status: DC
  Administered 2020-01-26 – 2020-01-28 (×3): 2
  Filled 2020-01-26 (×4): qty 2

## 2020-01-26 MED ORDER — NOREPINEPHRINE 4 MG/250ML-% IV SOLN
0.0000 ug/min | INTRAVENOUS | Status: DC
  Administered 2020-01-26 – 2020-01-27 (×3): 2 ug/min via INTRAVENOUS
  Administered 2020-01-28: 3 ug/min via INTRAVENOUS
  Filled 2020-01-26 (×5): qty 250

## 2020-01-26 NOTE — Progress Notes (Signed)
[place] for IV Heparin  Indication: DVT  Allergies  Allergen Reactions  . Iodine Swelling    Patient Measurements: Height: 5\' 11"  (180.3 cm) Weight: 183 lb 3.2 oz (83.1 kg) IBW/kg (Calculated) : 75.3  Heparin dosing weight: 85.9 kg  Vital Signs: Temp: 97 F (36.1 C) (02/21 0945) BP: 93/55 (02/21 0945) Pulse Rate: 77 (02/21 0945)  Labs: Recent Labs    01/24/20 0204 01/24/20 0204 01/24/20 1235 01/25/20 0546 01/25/20 1615 01/26/20 0525  HGB 15.3   < >  --  16.4  --  14.9  HCT 45.0  --   --  49.5  --  44.2  PLT 110*  --   --  135*  --  147*  HEPARINUNFRC 0.76*   < > 0.49 0.36  --  0.67  CREATININE 3.44*   < >  --  4.11* 3.97* 2.94*   < > = values in this interval not displayed.    Estimated Creatinine Clearance: 23.1 mL/min (A) (by C-G formula based on SCr of 2.94 mg/dL (H)).  Assessment: 39 YOM with COVID-19 pneumonia. Dopplers revealed an acute L-DVT and R-superficial thrombosis. Pharmacy consulted for Lovenox on 2/16, but patient now with worsening renal function. Discussed with Dr. Sherral Hammers - Will transition to IV Heparin at this time.   Heparin level remains therapeutic at 0.67 on 1200 units/hr. No bleeding noted, Hgb stable, Plt continue to improve. D-dimer trending down. No overt s/s of bleeding this AM per RN.   Goal of Therapy:  Heparin level 0.3-0.7 units/ml Monitor platelets by anticoagulation protocol: Yes   Plan:  - Continue heparin drip at 1200 units/hr  - Daily heparin level, CBC - Monitor for s/sx of bleeding  Thank you for involving pharmacy in this patient's care.  Albertina Parr, PharmD., BCPS Clinical Pharmacist Clinical phone for 01/26/20 until 5pm: [phone]

## 2020-01-26 NOTE — Progress Notes (Signed)
[place] Progress Note Patient Name: Christopher Travis DOB: 05/25/1946 MRN: [id number]   Date of Service  01/26/2020  HPI/Events of Note  Notified of tachycardia, afib on telemetry and confirmed on EKG. BP remained stable and was actually elevated and norepinephrine now discontinued.  eICU Interventions  HR now back to baseline 100s. Bedside RN reports of occasional bradycardia as low as 40s. Will hold off further intervention for rate control for now. Will give calcium gluconate 1 g for corrected calcium of 8.4     Intervention Category Major Interventions: Arrhythmia - evaluation and management  Judd Lien 01/26/2020, 9:40 PM

## 2020-01-26 NOTE — Progress Notes (Addendum)
[facility] Progress Note    Assessment/ Plan:   1.  Acute oliguric kidney injury: Cr up to 4.11 with BUN up to 185. Started CRRT 2/20.  BUN improving, did have some UOP yesterday.  All 4K fluids.  Will let run positive 50 mL/ hr x 12 hrs (600 mL fluid) to correct hypovolemia--> looks like from chart he's > 7L positive but weights are 2K below previous admission weights and hypernatremia suggests FW deficit.   This is an intentionally conservative estimate of FW deficit given his tenuous respiratory status.  2.  Hypernatremia: slightly improving. Will eventually correct with volume and with CRRT  3.  Acute hypoxic RF: d/t COVID and probable PE, on decadron, s/p remdesivir, on cefepime and fluconazole and hep gtt  4.  Parkinson's: on Sinemet  5.  Thrombocytopenia: plts 105, HIT negative  6.  Elevated LFTs: likely d/t COVID infection, w/u per primary  7.  Dispo: prognosis appears grim.  Palliative care involved, appreciate assistance.    Subjective:    On levophed now and 40L HFNCO2.  LFTs rising. HIT negative.  Na slightly better at 151.     Objective:   BP (!) 93/55   Pulse 77   Temp (!) 97 F (36.1 C)   Resp 16   Ht 5\' 11"  (1.803 m)   Wt 83.1 kg   SpO2 95%   BMI 25.55 kg/m   Intake/Output Summary (Last 24 hours) at 01/26/2020 1016 Last data filed at 01/26/2020 1000 Gross per 24 hour  Intake 1279.96 ml  Output 1291 ml  Net -11.04 ml   Weight change: -2.1 kg  Physical Exam:  Seen remotely d/t COVID 19 pandemic, using observations of PMD and RNs as well as chart review of all available data to make medical decisions.    Imaging: DG Abd 1 View  Result Date: 01/24/2020 CLINICAL DATA:  Encounter for nasogastric tube placement. EXAM: ABDOMEN - 1 VIEW COMPARISON:  Subsequent chest radiograph FINDINGS: No enteric tube is visualized in the abdomen. The tube is coiled in the esophagus on concurrent chest radiograph. Air throughout nondilated bowel loops in  the central abdomen. No evidence of obstruction. IMPRESSION: No enteric tube visualized in the abdomen. The tube is coiled in the esophagus on subsequent chest radiograph. Electronically Signed   By: Keith Rake M.D.   On: 01/24/2020 23:24   DG Chest Port 1 View  Result Date: 01/25/2020 CLINICAL DATA:  74 year old male status post central line placement. EXAM: PORTABLE CHEST 1 VIEW COMPARISON:  Chest x-ray 01/24/2020. FINDINGS: Previously noted enteric tube has been removed. Left internal jugular Vas-Cath with tip terminating at the superior cavoatrial junction. Lung volumes are low. Widespread ill-defined opacities and areas of interstitial prominence are again noted in the lungs bilaterally (left greater than right), with similar aeration to the prior study. No definite pleural effusions. No pneumothorax. No evidence of pulmonary edema. Heart size is normal. The patient is rotated to the left on today's exam, resulting in distortion of the mediastinal contours and reduced diagnostic sensitivity and specificity for mediastinal pathology. IMPRESSION: 1. Support apparatus, as above. 2. Multilobar bilateral pneumonia with similar aeration to the prior study, as above. Electronically Signed   By: Vinnie Langton M.D.   On: 01/25/2020 13:13   DG CHEST PORT 1 VIEW  Result Date: 01/24/2020 CLINICAL DATA:  Pneumonia. Orogastric tube placement. EXAM: PORTABLE CHEST 1 VIEW COMPARISON:  Chest radiograph 01/21/2019 FINDINGS: The enteric tube is coiled in the mid upper esophagus,  tip not included in the field of view. Low lung volumes with diffuse heterogeneous bilateral lung opacities, not significantly changed. Unchanged heart size and mediastinal contours. No pneumothorax or large pleural effusion. IMPRESSION: 1. Enteric tube coiled in the mid upper esophagus, tip not included in the field of view. Recommend repositioning. 2. Unchanged diffuse heterogeneous bilateral lung opacities consistent with COVID  pneumonia Electronically Signed   By: Keith Rake M.D.   On: 01/24/2020 23:29    Labs: BMET Recent Labs  Lab 01/22/20 2004 01/23/20 0020 01/23/20 1658 01/24/20 0204 01/25/20 0546 01/25/20 1615 01/26/20 0525  NA 147* 148* 152* 151* 152* 153* 151*  K 5.0 4.6 4.1 4.3 4.8 5.2* 4.9  CL 108 108 111 111 112* 116* 112*  CO2 18* 22 21* 22 18* 18* 22  GLUCOSE 208* 205* 195* 196* 222* 219* 118*  BUN 113* 114* 150* 147* 185* 181* 62*  CREATININE 2.51* 2.69* 3.44* 3.44* 4.11* 3.97* 2.94*  CALCIUM 8.3* 8.4* 8.1* 8.0* 7.8* 7.7* 7.7*  PHOS  --  4.7*  --  6.7* 6.3* 5.5* 4.1   CBC Recent Labs  Lab 01/23/20 0020 01/24/20 0204 01/25/20 0546 01/26/20 0525  WBC 31.6* 32.8* 38.2* 46.9*  NEUTROABS 28.8* 31.3* 36.3* 43.5*  HGB 16.5 15.3 16.4 14.9  HCT 48.0 45.0 49.5 44.2  MCV 93.2 93.8 94.8 94.4  PLT 125* 110* 135* 147*    Medications:    . carbidopa-levodopa  2 tablet Oral QHS  . carbidopa-levodopa  2 tablet Oral 3 times per day   And  . carbidopa-levodopa  1 tablet Oral QHS  . chlorhexidine  15 mL Mouth Rinse BID  . Chlorhexidine Gluconate Cloth  6 each Topical Daily  . dexamethasone (DECADRON) injection  6 mg Intravenous Q24H  . feeding supplement (ENSURE ENLIVE)  237 mL Oral TID BM  . insulin aspart  0-9 Units Subcutaneous TID WC  . mouth rinse  15 mL Mouth Rinse 10 times per day  . metoprolol tartrate  50 mg Oral BID  . rOPINIRole  3 mg Oral Q24H  . sodium chloride flush  3 mL Intravenous Q12H  . sodium chloride flush  3 mL Intravenous Q12H      Madelon Lips MD 01/26/2020, 10:16 AM

## 2020-01-26 NOTE — Progress Notes (Signed)
Pt is on a very high dose regimen of Sinemet. In total, he is on 11 tabs of Sinemet 25/100mg  IR. Due to his NPO status, we will convert his regimen to per tube with a modified schedule (Dr Sherral Hammers):  Sinemet 25/100mg  3 tablets @0700 , 1200, 1700 + 2 tablets at [place], PharmD, BCIDP, AAHIVP, CPP Infectious Disease Pharmacist 01/26/2020 5:37 PM

## 2020-01-26 NOTE — Progress Notes (Addendum)
PROGRESS NOTE    Christopher Travis  DSK:[id number] DOB: [birth year] DOA: 01/27/2020 PCP: System, Pcp Not In   Brief Narrative:  Christopher Travis is an 74 y.o.  Hispanic male PMHx Parkinson's disease and non-Hodgkin's lymphoma in remission   who developed subjective fever malaise about 1 week prior to admission, started developing shortness of breath about 2 to 3 days ago at home his sats began reading 80% presented to [facility] found to be hypoxic,  Tachypneic chest x-ray showed bilateral infiltrates, SARS-CoV-2 PCR, his creatinine was 1.3 (up from baseline 1.0) elevated bili Ruben with a leukocytosis lactic acid of 4.3 procalcitonin 2.5 D-dimer 1.28 blood cultures ordered was fluid resuscitated started on IV Decadron on remdesivir.   Subjective: 2/21 afebrile overnight, but hypothermic overnight.  Patient now with heating blanket.  Resting comfortably. Did not awaken upon exam, allow patient to continue to rest comfortably    Assessment & Plan:   Principal Problem:   Pneumonia due to COVID-19 virus Active Problems:   Parkinson disease ([place])   Hypertension   AKI (acute kidney injury) ([place])   Severe sepsis ([place])   Hyperbilirubinemia   Acute respiratory failure with hypoxia ([place])   Parkinson's disease ([place])   Palliative care encounter  Covid pneumonia/Acute Respiratory Failure with hypoxia/CAP COVID-19 Labs  Recent Labs    01/24/20 0204 01/25/20 0546 01/26/20 0525  DDIMER >20.00* >20.00* 9.90*  FERRITIN 423* 2,386* 2,343*  CRP 10.2* 13.1* 9.9*    2/12 SARS coronavirus 2 positive  Covid pneumonia/acute respiratory failure with hypoxia/HCAP -2/13 Covid convalescent plasma x2 doses -Decadron 6 mg daily -Remdesivir per pharmacy protocol -2/15 Actemra x1 dose -Titrate O2 to maintain SPO2 85% if possible.  Patient appears to be happy hypoxic doing well low 80s -Prone patient 16 hours/day; if cannot tolerate prone 2 to 3 hours per shift -2/19 patient received multiple doses  of Ativan, fentanyl, morphine overnight now heavily sedated. -2/19 Ativan 1 to 2 mg PRN q 4 hr (hold) -2/19 Fentanyl 50 mcg q 4 hr PRN -2/20 PCXR; multi lobar pneumonia see results below -2/21 Precedex started overnight.  Continue 1 more night.  Goal RASS score 0 to -1 -2/21 Levophed started overnight; titrate off as tolerated. MAP goal >60  Severe Sepsis -Unknown source HCAP vs bacteremia vs UTI vs unk source vs secondary to acute DVT -Leukocytosis with negative fever, bands or left shift -2/17 PCXR; worsening groundglass opacification see results below -2/18 patient's respiratory status continues to deteriorate will DC CAP coverage and start HCAP coverage x7 days.  Thrush -Fluconazole IV complete 5-day course  Leukocytosis -Most likely reactive reactive/demargination.  Negative white count, negative left shift.  Negative fever, patient has been hypothermic.  Cultures to date NGTD see results below   Acute LEFT lower extremity DVT/Acute RIGHT lower extremity SVT -Treatment dose Lovenox -Unable to verify if patient has PE spoke with daughter who confirms has true iodine allergy, therefore no CTA PE protocol.  Chronic diastolic CHF -Strict in and out +7.1 L -Daily weight Filed Weights   01/19/20 0419 01/25/20 0500 01/26/20 0500  Weight: 85.9 kg 85.2 kg 83.1 kg  -D5W 59m/hr -CRRT per nephrology  Essential hypertension: -Holding antihypertensive medication due to the sepsis picture. -Patient has had multiple episode of tachycardia/bradycardia.  Sick sinus syndrome?   -Echocardiogram positive for chronic diastolic CHF see results below  Sick sinus syndrome? -Bradycardia has resolved therefore rules out sick sinus syndrome.  Chronic diastolic CHF see above  Acute kidney injury: Recent Labs  Lab 01/23/20 1658 01/24/20 0204 01/25/20 0546 01/25/20 1615 01/26/20 0525  CREATININE 3.44* 3.44* 4.11* 3.97* 2.94*  -See CHF -2/18 renal ultrasound nondiagnostic see results  below -2/18 Lasix IV 60 mg x 1 -Patient's renal status has continued to worsen patient now uremic.  Spoke with Dr. Rodman Key nephrology about initiating CRRT.  She concurs patient would benefit from CRRT. -2/21 improvement with CRRT  Uremic Encephalopathy -Trend BUN Results for Christopher Travis, Christopher Travis (MRN [id number]) as of 01/26/2020 07:58  Ref. Range 01/23/2020 16:58 01/24/2020 02:04 01/25/2020 05:46 01/25/2020 16:15 01/26/2020 05:25  BUN Latest Ref Range: 8 - 23 mg/dL 150 (H) 147 (H) 185 (H) 181 (H) 62 (H)  -Improvement however patient did not wake up with stimuli, will allow patient to continue to rest.    Hyperbilirubinemia -Abdominal exam is benign, now resolved.  Parkinson's disease -At home on combination of Sinemet IR 50-200 and  Sinemet CR 100-479m.  -Sinemet per pharmacy.  Changes will be made to home dosing secondary to Sinemet CR not being able to be crushed and placed down tube  Hypernatremia -See CHF  Thrombocytopenia Results for Christopher Travis, Christopher Travis(MRN [id number] as of 01/26/2020 07:58  Ref. Range 01/20/2020 01:34 01/21/2020 05:44 01/22/2020 02:49 01/23/2020 00:20 01/24/2020 02:04 01/25/2020 05:46 01/26/2020 05:25  Platelets Latest Ref Range: 150 - 400 K/uL 203 251 PLATELET CLUMPS NOTED ON SMEAR, COUNT APPEARS DECREASED 125 (L) 110 (L) 135 (L) 147 (L)       Goals of care/ethics: I had a long discussion with the family the patient on 01/19/2020, expressed that he did not want to be intubated or resuscitated this was expressed to the family.  I have expressed to the family my concerns about his extremely poor prognosis they would like to talk it over and give it a few more hours to see if he turns around to have explained to him that it is unlikely but we would do as they wish.  We will call him again this afternoon. -2/17 consult palliative care; patient with extremely poor prognosis family does not want to escalate care.  Comfort care vs home hospice vs residential hospice  -2/20 again spoke with  family who continues to push for reversal of DNR.  Again informed family that if they could convince patient to reverse DNR then we will honor that request.  Unfortunately currently patient uremic A/O x1, unable to have an in-depth conversation with family concerning reversal of his previous DNR request.  CFrance(daughter) is adamant that father did not understand when he made himself DNR because his native language is Spanish, however Dr. ATammi Klippelphysician who explained CODE STATUS to patient native language is Spanish.   2/21 patient now on CRRT x24 hours patient with extremely poor prognosis for surviving hospitalization    DVT prophylaxis: Lovenox treatment dose Code Status: DNR Family Communication: 2/21 Advised daughter CKentucky(daughter) and family renal status has continued to deteriorate and need to begin CRRT.  Answered all questions  Disposition Plan: TBD   Consultants:  Dr. LRodman Keynephrology  PCCM Dr. MLake Bells   Procedures/Significant Events:  2/13 Covid convalescent plasma x2 doses 2/15 Actemra x1 dose 2/16 bilateral lower extremity Doppler;RIGHT: Positive acute SVT involving the  right great saphenous vein, and right varicosities or other superficial veins.  LEFT: Positive acute DVT involving the left gastrocnemius veins.  2/17 PCXR; low lung volume with worsening of LEFT>> RIGHT interstitial and ground-glass opacities concerning for bilateral pneumonia 2/18 echocardiogram;.Left Ventricle: EF= 60 -65%. The left ventricle  has normal function. The left ventricle has no regional wall motion abnormalities.  -Grade I diastolic dysfunction (impaired relaxation).  -There is mildly elevated pulmonary artery systolic pressure.    2/19 renal US; No acute or focal abnormality identified. No hydronephrosis. 4 cm simple cyst left kidney 2/20; Precedex started overnight 2/21 PCXR;-multilobar bilateral pneumonia with similar aeration to the prior Study -Left internal jugular  Vas-Cath with tip terminating at the superior cavoatrial junction. 2/21 NG tube placed   I have personally reviewed and interpreted all radiology studies and my findings are as above.  VENTILATOR SETTINGS: HHFNC 2/21 FiO2 100% Flow; 40 L/min SPO2; 91%   Cultures 2/12 blood 1/2 positive coag negative staph most likely contaminant 2/12 SARS coronavirus 2 positive 2/17 sputum pending 2/17 Blood RIGHT AC NGTD 2/17 blood LEFT AC NGTD 2/18 MRSA by PCR negative 2/19 urine negative 2/20 acute hepatitis panel pending 2/20 HIV panel negative    Antimicrobials: Anti-infectives (From admission, onward)   Start     Dose/Rate Stop   01/23/20 0800  ceFEPIme (MAXIPIME) 2 g in sodium chloride 0.9 % 100 mL IVPB     2 g 200 mL/hr over 30 Minutes     01/18/20 1000  remdesivir 100 mg in sodium chloride 0.9 % 100 mL IVPB     100 mg 200 mL/hr over 30 Minutes 01/21/20 1106   01/18/20 0400  azithromycin (ZITHROMAX) 500 mg in sodium chloride 0.9 % 250 mL IVPB     500 mg 250 mL/hr over 60 Minutes 01/22/20 0422   01/18/20 0300  cefTRIAXone (ROCEPHIN) 2 g in sodium chloride 0.9 % 100 mL IVPB     2 g 200 mL/hr over 30 Minutes 01/22/20 0317   02/01/2020 2045  remdesivir 100 mg in sodium chloride 0.9 % 100 mL IVPB     100 mg 200 mL/hr over 30 Minutes 01/27/2020 2232       Devices NG tube placed 2/21>>   LINES  LEFT IJ Vas-Cath 2/20>>    Continuous Infusions: .  prismasol BGK 4/2.5 500 mL/hr at 01/26/20 0601  .  prismasol BGK 4/2.5 200 mL/hr at 01/25/20 1331  . sodium chloride    . ceFEPime (MAXIPIME) IV Stopped (01/25/20 2230)  . dexmedetomidine (PRECEDEX) IV infusion 0.6 mcg/kg/hr (01/26/20 0700)  . dextrose Stopped (01/24/20 1939)  . fluconazole (DIFLUCAN) IV Stopped (01/25/20 0916)  . heparin 1,200 Units/hr (01/26/20 0700)  . norepinephrine (LEVOPHED) Adult infusion 4 mcg/min (01/26/20 0700)  . prismasol BGK 4/2.5 1,000 mL/hr at 01/26/20 0607     Objective: Vitals:   01/26/20  0615 01/26/20 0630 01/26/20 0645 01/26/20 0700  BP: 105/64 107/60 99/63 (!) 128/56  Pulse: 77 60 74 72  Resp: 14 18 15 18   Temp: (!) 95.5 F (35.3 C) (!) 95.4 F (35.2 C) (!) 95.5 F (35.3 C) (!) 95.5 F (35.3 C)  TempSrc:      SpO2: 97% 96% 98% 91%  Weight:      Height:        Intake/Output Summary (Last 24 hours) at 01/26/2020 0756 Last data filed at 01/26/2020 0700 Gross per 24 hour  Intake 1180.12 ml  Output 1182 ml  Net -1.88 ml   Filed Weights   01/19/20 0419 01/25/20 0500 01/26/20 0500  Weight: 85.9 kg 85.2 kg 83.1 kg   Physical Exam:  General: A/O x0 did not allow with physical exam and gentle stimulation, positive acute respiratory distress Eyes: negative scleral hemorrhage, negative anisocoria, negative icterus ENT: Negative Runny nose,  negative gingival bleeding, Neck:  Negative scars, masses, torticollis, lymphadenopathy, JVD Lungs: Clear to auscultation bilaterally without wheezes positive mild crackles Cardiovascular: Regular rate and rhythm without murmur gallop or rub normal S1 and S2 Abdomen: negative abdominal pain, nondistended, positive soft, bowel sounds, no rebound, no ascites, no appreciable mass Extremities: No significant cyanosis, clubbing, or edema bilateral lower extremities Skin: Negative rashes, lesions, ulcers Psychiatric: Could not assess secondary to somnolence Central nervous system: Could not assess secondary to somnolence        Data Reviewed: Care during the described time interval was provided by me .  I have reviewed this patient's available data, including medical history, events of note, physical examination, and all test results as part of my evaluation.   CBC: Recent Labs  Lab 01/22/20 0249 01/23/20 0020 01/24/20 0204 01/25/20 0546 01/26/20 0525  WBC 29.6* 31.6* 32.8* 38.2* 46.9*  NEUTROABS 27.9* 28.8* 31.3* 36.3* 43.5*  HGB 16.1 16.5 15.3 16.4 14.9  HCT 47.6 48.0 45.0 49.5 44.2  MCV 93.9 93.2 93.8 94.8 94.4  PLT  PLATELET CLUMPS NOTED ON SMEAR, COUNT APPEARS DECREASED 125* 110* 135* 505*   Basic Metabolic Panel: Recent Labs  Lab 01/23/20 0020 01/23/20 0020 01/23/20 1658 01/24/20 0204 01/25/20 0546 01/25/20 1615 01/26/20 0525  NA 148*   < > 152* 151* 152* 153* 151*  K 4.6   < > 4.1 4.3 4.8 5.2* 4.9  CL 108   < > 111 111 112* 116* 112*  CO2 22   < > 21* 22 18* 18* 22  GLUCOSE 205*   < > 195* 196* 222* 219* 118*  BUN 114*   < > 150* 147* 185* 181* 62*  CREATININE 2.69*   < > 3.44* 3.44* 4.11* 3.97* 2.94*  CALCIUM 8.4*   < > 8.1* 8.0* 7.8* 7.7* 7.7*  MG 2.6*  --  2.7* 2.6* 3.3*  --  3.0*  PHOS 4.7*  --   --  6.7* 6.3* 5.5* 4.1   < > = values in this interval not displayed.   GFR: Estimated Creatinine Clearance: 23.1 mL/min (A) (by C-G formula based on SCr of 2.94 mg/dL (H)). Liver Function Tests: Recent Labs  Lab 01/22/20 0249 01/22/20 0249 01/23/20 0020 01/24/20 0204 01/25/20 0546 01/25/20 1615 01/26/20 0525  AST 60*  --  58* 39 92*  --  100*  ALT 10  --  7 9 15   --  14  ALKPHOS 230*  --  209* 202* 239*  --  225*  BILITOT 1.6*  --  1.6* 1.7* 3.3*  --  3.9*  PROT 6.3*  --  6.1* 5.8* 6.7  --  6.0*  ALBUMIN 2.9*   < > 3.0* 2.9* 3.1* 2.9* 2.9*   < > = values in this interval not displayed.   No results for input(s): LIPASE, AMYLASE in the last 168 hours. No results for input(s): AMMONIA in the last 168 hours. Coagulation Profile: No results for input(s): INR, PROTIME in the last 168 hours. Cardiac Enzymes: No results for input(s): CKTOTAL, CKMB, CKMBINDEX, TROPONINI in the last 168 hours. BNP (last 3 results) No results for input(s): PROBNP in the last 8760 hours. HbA1C: No results for input(s): HGBA1C in the last 72 hours. CBG: Recent Labs  Lab 01/24/20 2019 01/25/20 0708 01/25/20 1257 01/25/20 1625 01/26/20 0215  GLUCAP 181* 204* 180* 185* 140*   Lipid Profile: No results for input(s): CHOL, HDL, LDLCALC, TRIG, CHOLHDL, LDLDIRECT in the last 72 hours. Thyroid  Function Tests: No  results for input(s): TSH, T4TOTAL, FREET4, T3FREE, THYROIDAB in the last 72 hours. Anemia Panel: Recent Labs    01/24/20 0204 01/25/20 0546  FERRITIN 423* 2,386*   Urine analysis: No results found for: COLORURINE, APPEARANCEUR, LABSPEC, PHURINE, GLUCOSEU, HGBUR, BILIRUBINUR, KETONESUR, PROTEINUR, UROBILINOGEN, NITRITE, LEUKOCYTESUR Sepsis Labs: @LABRCNTIP (procalcitonin:4,lacticidven:4)  ) Recent Results (from the past 240 hour(s))  Blood culture (routine x 2)     Status: Abnormal   Collection Time: 01/15/2020  7:25 PM   Specimen: BLOOD  Result Value Ref Range Status   Specimen Description   Final    BLOOD BLOOD LEFT FOREARM Performed at [facility], [address] [place], [place] [address]    Special Requests   Final    BOTTLES DRAWN AEROBIC AND ANAEROBIC Blood Culture adequate volume Performed at [facility], [address] [place], [place] [address]    Culture  Setup Time   Final    GRAM POSITIVE COCCI AEROBIC BOTTLE ONLY CRITICAL RESULT CALLED TO, READ BACK BY AND VERIFIED WITH: PHARMD MAY BALL @1501  01/18/20 AKT    Culture (A)  Final    STAPHYLOCOCCUS SPECIES (COAGULASE NEGATIVE) THE SIGNIFICANCE OF ISOLATING THIS ORGANISM FROM A SINGLE SET OF BLOOD CULTURES WHEN MULTIPLE SETS ARE DRAWN IS UNCERTAIN. PLEASE NOTIFY THE MICROBIOLOGY DEPARTMENT WITHIN ONE WEEK IF SPECIATION AND SENSITIVITIES ARE REQUIRED. Performed at [facility], [place] [address], [place], [place] [address]    Report Status 01/19/2020 FINAL  Final  Blood culture (routine x 2)     Status: None   Collection Time: 01/10/2020  7:54 PM   Specimen: BLOOD  Result Value Ref Range Status   Specimen Description   Final    BLOOD RIGHT ANTECUBITAL Performed at [facility], [address] [place], [place] [address]    Special Requests   Final    BOTTLES DRAWN AEROBIC AND ANAEROBIC Blood Culture adequate volume Performed at [facility], [address] [place], [place] [address]    Culture   Final    NO GROWTH 5 DAYS Performed at [facility], [place] [address] [place], [place] [address]    Report Status 01/22/2020 FINAL  Final  SARS Coronavirus 2 Ag (30 min TAT) - Nasal Swab (BD Veritor Kit)     Status: Abnormal   Collection Time: 01/27/2020  7:59 PM   Specimen: Nasal Swab (BD Veritor Kit)  Result Value Ref Range Status   SARS Coronavirus 2 Ag POSITIVE (A) NEGATIVE Final    Comment: RESULT CALLED TO, READ BACK BY AND VERIFIED WITH: KELLY NEAL RN @2034  01/22/2020 OLSONM (NOTE) SARS-CoV-2 antigen PRESENT. Positive results indicate the presence of viral antigens, but clinical correlation with patient history and other diagnostic information is necessary to determine patient infection status.  Positive results do not rule out bacterial infection or co-infection  with other viruses. False positive results are rare but can occur, and confirmatory RT-PCR testing may be appropriate in some circumstances. The expected result is Negative. Fact Sheet for Patients: PodPark.tn Fact Sheet for Providers: GiftContent.is  This test is not yet approved or cleared by the [place] FDA and  has been authorized for detection and/or diagnosis of SARS-CoV-2 by FDA under an Emergency Use Authorization (EUA).  This EUA will remain in effect (meaning this test can be used) for the duration of  the COVID-19 decla ration under Section 564(b)(1) of the Act, 21 U.S.C. section 360bbb-3(b)(1), unless the  authorization is terminated or revoked sooner. Performed at [facility], [address] [place], [place] [address]   MRSA PCR Screening     Status: None   Collection Time: 01/20/20  9:40 AM   Specimen: Nasopharyngeal  Result Value Ref Range Status   MRSA by PCR NEGATIVE NEGATIVE Final    Comment:        The GeneXpert MRSA Assay (FDA approved for NASAL  specimens only), is one component of a comprehensive MRSA colonization surveillance program. It is not intended to diagnose MRSA infection nor to guide or monitor treatment for MRSA infections. Performed at [facility], [place] [address], [place], [place] [address]   Culture, blood (routine x 2)     Status: None (Preliminary result)   Collection Time: 01/22/20  7:54 PM   Specimen: BLOOD  Result Value Ref Range Status   Specimen Description BLOOD RIGHT ANTECUBITAL  Final   Special Requests   Final    BOTTLES DRAWN AEROBIC AND ANAEROBIC Blood Culture adequate volume Performed at [facility], [place] [address], [place], [place] [address]    Culture NO GROWTH 3 DAYS  Final   Report Status PENDING  Incomplete  Culture, blood (routine x 2)     Status: None (Preliminary result)   Collection Time: 01/22/20  8:04 PM   Specimen: BLOOD  Result Value Ref Range Status   Specimen Description BLOOD LEFT ANTECUBITAL  Final   Special Requests   Final    BOTTLES DRAWN AEROBIC ONLY Blood Culture results may not be optimal due to an inadequate volume of blood received in culture bottles Performed at [facility] [address] [place], [place] [address]    Culture NO GROWTH 3 DAYS  Final   Report Status PENDING  Incomplete  MRSA PCR Screening     Status: None   Collection Time: 01/23/20  9:20 AM   Specimen: Nasal Mucosa; Nasopharyngeal  Result Value Ref Range Status   MRSA by PCR NEGATIVE NEGATIVE Final    Comment:        The GeneXpert MRSA Assay (FDA approved for NASAL specimens only), is one component of a comprehensive MRSA colonization surveillance program. It is not intended to diagnose MRSA infection nor to guide or monitor treatment for MRSA infections. Performed at [facility], [place] [address], [place], [place] [address]   Culture, Urine     Status: None   Collection Time: 01/24/20  4:43 PM   Specimen: Urine, Clean Catch  Result  Value Ref Range Status   Specimen Description   Final    URINE, CLEAN CATCH Performed at [facility], [place] [address] [place], [place] [address]    Special Requests   Final    NONE Performed at [facility], [place] [address] [place], [place] [address]    Culture   Final    NO GROWTH Performed at [facility], [place] [address] [place], [place] [address]    Report Status 01/25/2020 FINAL  Final         Radiology Studies: DG Abd 1 View  Result Date: 01/24/2020 CLINICAL DATA:  Encounter for nasogastric tube placement. EXAM: ABDOMEN - 1 VIEW COMPARISON:  Subsequent chest radiograph FINDINGS: No enteric tube is visualized in the abdomen. The tube is coiled in the esophagus on concurrent chest radiograph. Air throughout nondilated bowel loops in the central abdomen. No evidence of obstruction. IMPRESSION: No enteric  tube visualized in the abdomen. The tube is coiled in the esophagus on subsequent chest radiograph. Electronically Signed   By: Keith Rake M.D.   On: 01/24/2020 23:24   DG Chest Port 1 View  Result Date: 01/25/2020 CLINICAL DATA:  74 year old male status post central line placement. EXAM: PORTABLE CHEST 1 VIEW COMPARISON:  Chest x-ray 01/24/2020. FINDINGS: Previously noted enteric tube has been removed. Left internal jugular Vas-Cath with tip terminating at the superior cavoatrial junction. Lung volumes are low. Widespread ill-defined opacities and areas of interstitial prominence are again noted in the lungs bilaterally (left greater than right), with similar aeration to the prior study. No definite pleural effusions. No pneumothorax. No evidence of pulmonary edema. Heart size is normal. The patient is rotated to the left on today's exam, resulting in distortion of the mediastinal contours and reduced diagnostic sensitivity and specificity for mediastinal pathology. IMPRESSION: 1. Support apparatus, as above. 2. Multilobar bilateral  pneumonia with similar aeration to the prior study, as above. Electronically Signed   By: Vinnie Langton M.D.   On: 01/25/2020 13:13   DG CHEST PORT 1 VIEW  Result Date: 01/24/2020 CLINICAL DATA:  Pneumonia. Orogastric tube placement. EXAM: PORTABLE CHEST 1 VIEW COMPARISON:  Chest radiograph 01/21/2019 FINDINGS: The enteric tube is coiled in the mid upper esophagus, tip not included in the field of view. Low lung volumes with diffuse heterogeneous bilateral lung opacities, not significantly changed. Unchanged heart size and mediastinal contours. No pneumothorax or large pleural effusion. IMPRESSION: 1. Enteric tube coiled in the mid upper esophagus, tip not included in the field of view. Recommend repositioning. 2. Unchanged diffuse heterogeneous bilateral lung opacities consistent with COVID pneumonia Electronically Signed   By: Keith Rake M.D.   On: 01/24/2020 23:29        Scheduled Meds: . carbidopa-levodopa  2 tablet Oral QHS  . carbidopa-levodopa  2 tablet Oral 3 times per day   And  . carbidopa-levodopa  1 tablet Oral QHS  . chlorhexidine  15 mL Mouth Rinse BID  . Chlorhexidine Gluconate Cloth  6 each Topical Daily  . dexamethasone (DECADRON) injection  6 mg Intravenous Q24H  . feeding supplement (ENSURE ENLIVE)  237 mL Oral TID BM  . insulin aspart  0-9 Units Subcutaneous TID WC  . mouth rinse  15 mL Mouth Rinse 10 times per day  . metoprolol tartrate  50 mg Oral BID  . rOPINIRole  3 mg Oral Q24H  . sodium chloride flush  3 mL Intravenous Q12H  . sodium chloride flush  3 mL Intravenous Q12H   Continuous Infusions: .  prismasol BGK 4/2.5 500 mL/hr at 01/26/20 0601  .  prismasol BGK 4/2.5 200 mL/hr at 01/25/20 1331  . sodium chloride    . ceFEPime (MAXIPIME) IV Stopped (01/25/20 2230)  . dexmedetomidine (PRECEDEX) IV infusion 0.6 mcg/kg/hr (01/26/20 0700)  . dextrose Stopped (01/24/20 1939)  . fluconazole (DIFLUCAN) IV Stopped (01/25/20 0916)  . heparin 1,200 Units/hr  (01/26/20 0700)  . norepinephrine (LEVOPHED) Adult infusion 4 mcg/min (01/26/20 0700)  . prismasol BGK 4/2.5 1,000 mL/hr at 01/26/20 0607     LOS: 8 days   The patient is critically ill with multiple organ systems failure and requires high complexity decision making for assessment and support, frequent evaluation and titration of therapies, application of advanced monitoring technologies and extensive interpretation of multiple databases. Critical Care Time devoted to patient care services described in this note  Time spent: 40 minutes     Avagrace Botelho,  Geraldo Docker, MD [facility] Pager [phone]  If 7PM-7AM, please contact night-coverage [email or web address] Password [facility] 01/26/2020, 7:56 AM

## 2020-01-26 NOTE — Progress Notes (Signed)
[facility] [facility] [place] Progress Note Patient Name: Astyn Balderson DOB: 09-20-1946 MRN: [id number]   Date of Service  01/26/2020  HPI/Events of Note  Intermittently hypotensive such that MAP dips below 65.   eICU Interventions  Ordered levophed to be titrated to MAP >\= 65 mmHg.     Intervention Category Major Interventions: Hypotension - evaluation and management  Charlott Rakes 01/26/2020, 12:52 AM

## 2020-01-26 NOTE — Progress Notes (Signed)
Chart reviewed.   Spoke with [name] on the phone.  We discussed the roller coaster Daune and the family have been on the past 8 days.  We talked about continuous dialysis being started.  The goal being to gently remove excess fluid from his body and to clean toxins out of his blood.  It will be at least 36 - 48 hours before we know if there is significant improvement.   We discussed her fathers agitation, precedex gtt and soft restraints.   We also discussed the initiation of pressors to maintain his blood pressure so that he can tolerate hemodialysis.   He is now on 40L of high flow.  I mentioned the ability to E-Link video conference for short visits.    Benedict expressed great appreciation to the doctors and nursing staff caring for her father.   I expressed that we want more than anything to improve his health and get him home to her, and that we will keep supporting him and the family.  I committed to call her again tomorrow with an update.    I'm concerned about his ferritin increasing again, worsening LFTs and his very elevated WBC.   I'm concerned if his liver fails we may have no where else to go.  Florentina Jenny, PA-C Palliative Medicine Office:  [phone]   15 min.

## 2020-01-27 DIAGNOSIS — J189 Pneumonia, unspecified organism: Secondary | ICD-10-CM

## 2020-01-27 DIAGNOSIS — D72829 Elevated white blood cell count, unspecified: Secondary | ICD-10-CM | POA: Diagnosis not present

## 2020-01-27 LAB — D-DIMER, QUANTITATIVE: D-Dimer, Quant: 12.96 ug/mL-FEU — ABNORMAL HIGH (ref 0.00–0.50)

## 2020-01-27 LAB — PHOSPHORUS: Phosphorus: 4.7 mg/dL — ABNORMAL HIGH (ref 2.5–4.6)

## 2020-01-27 LAB — COMPREHENSIVE METABOLIC PANEL
ALT: 20 U/L (ref 0–44)
AST: 154 U/L — ABNORMAL HIGH (ref 15–41)
Albumin: 2.9 g/dL — ABNORMAL LOW (ref 3.5–5.0)
Alkaline Phosphatase: 293 U/L — ABNORMAL HIGH (ref 38–126)
Anion gap: 20 — ABNORMAL HIGH (ref 5–15)
BUN: 83 mg/dL — ABNORMAL HIGH (ref 8–23)
CO2: 18 mmol/L — ABNORMAL LOW (ref 22–32)
Calcium: 8.2 mg/dL — ABNORMAL LOW (ref 8.9–10.3)
Chloride: 104 mmol/L (ref 98–111)
Creatinine, Ser: 2.02 mg/dL — ABNORMAL HIGH (ref 0.61–1.24)
GFR calc Af Amer: 36 mL/min — ABNORMAL LOW (ref >60)
GFR calc non Af Amer: 31 mL/min — ABNORMAL LOW (ref >60)
Glucose, Bld: 156 mg/dL — ABNORMAL HIGH (ref 70–99)
Potassium: 5 mmol/L (ref 3.5–5.1)
Sodium: 142 mmol/L (ref 135–145)
Total Bilirubin: 5.4 mg/dL — ABNORMAL HIGH (ref 0.3–1.2)
Total Protein: 6.5 g/dL (ref 6.5–8.1)

## 2020-01-27 LAB — CBC WITH DIFFERENTIAL/PLATELET
Abs Immature Granulocytes: 2.2 10*3/uL — ABNORMAL HIGH (ref 0.00–0.07)
Basophils Absolute: 0.1 10*3/uL (ref 0.0–0.1)
Basophils Relative: 0 %
Eosinophils Absolute: 0 10*3/uL (ref 0.0–0.5)
Eosinophils Relative: 0 %
HCT: 46 % (ref 39.0–52.0)
Hemoglobin: 15.6 g/dL (ref 13.0–17.0)
Immature Granulocytes: 4 %
Lymphocytes Relative: 1 %
Lymphs Abs: 0.6 10*3/uL — ABNORMAL LOW (ref 0.7–4.0)
MCH: 31.8 pg (ref 26.0–34.0)
MCHC: 33.9 g/dL (ref 30.0–36.0)
MCV: 93.7 fL (ref 80.0–100.0)
Monocytes Absolute: 0.8 10*3/uL (ref 0.1–1.0)
Monocytes Relative: 2 %
Neutro Abs: 51 10*3/uL — ABNORMAL HIGH (ref 1.7–7.7)
Neutrophils Relative %: 93 %
Platelets: 191 10*3/uL (ref 150–400)
RBC: 4.91 MIL/uL (ref 4.22–5.81)
RDW: 15.9 % — ABNORMAL HIGH (ref 11.5–15.5)
WBC: 54.6 10*3/uL (ref 4.0–10.5)
nRBC: 0.8 % — ABNORMAL HIGH (ref 0.0–0.2)

## 2020-01-27 LAB — RENAL FUNCTION PANEL
Albumin: 2.8 g/dL — ABNORMAL LOW (ref 3.5–5.0)
Anion gap: 17 — ABNORMAL HIGH (ref 5–15)
BUN: 71 mg/dL — ABNORMAL HIGH (ref 8–23)
CO2: 18 mmol/L — ABNORMAL LOW (ref 22–32)
Calcium: 8.1 mg/dL — ABNORMAL LOW (ref 8.9–10.3)
Chloride: 105 mmol/L (ref 98–111)
Creatinine, Ser: 2.16 mg/dL — ABNORMAL HIGH (ref 0.61–1.24)
GFR calc Af Amer: 33 mL/min — ABNORMAL LOW (ref >60)
GFR calc non Af Amer: 29 mL/min — ABNORMAL LOW (ref >60)
Glucose, Bld: 215 mg/dL — ABNORMAL HIGH (ref 70–99)
Phosphorus: 5.3 mg/dL — ABNORMAL HIGH (ref 2.5–4.6)
Potassium: 6.2 mmol/L — ABNORMAL HIGH (ref 3.5–5.1)
Sodium: 140 mmol/L (ref 135–145)

## 2020-01-27 LAB — BASIC METABOLIC PANEL
Anion gap: 19 — ABNORMAL HIGH (ref 5–15)
BUN: 69 mg/dL — ABNORMAL HIGH (ref 8–23)
CO2: 17 mmol/L — ABNORMAL LOW (ref 22–32)
Calcium: 8 mg/dL — ABNORMAL LOW (ref 8.9–10.3)
Chloride: 104 mmol/L (ref 98–111)
Creatinine, Ser: 2.05 mg/dL — ABNORMAL HIGH (ref 0.61–1.24)
GFR calc Af Amer: 36 mL/min — ABNORMAL LOW (ref >60)
GFR calc non Af Amer: 31 mL/min — ABNORMAL LOW (ref >60)
Glucose, Bld: 179 mg/dL — ABNORMAL HIGH (ref 70–99)
Potassium: 5.8 mmol/L — ABNORMAL HIGH (ref 3.5–5.1)
Sodium: 140 mmol/L (ref 135–145)

## 2020-01-27 LAB — CULTURE, BLOOD (ROUTINE X 2)
Culture: NO GROWTH
Culture: NO GROWTH
Special Requests: ADEQUATE

## 2020-01-27 LAB — PROTIME-INR
INR: 1.3 — ABNORMAL HIGH (ref 0.8–1.2)
Prothrombin Time: 16.5 seconds — ABNORMAL HIGH (ref 11.4–15.2)

## 2020-01-27 LAB — OCCULT BLOOD X 1 CARD TO LAB, STOOL: Fecal Occult Bld: POSITIVE — AB

## 2020-01-27 LAB — HEPARIN LEVEL (UNFRACTIONATED)
Heparin Unfractionated: 0.79 IU/mL — ABNORMAL HIGH (ref 0.30–0.70)
Heparin Unfractionated: 0.73 IU/mL — ABNORMAL HIGH (ref 0.30–0.70)

## 2020-01-27 LAB — GLUCOSE, CAPILLARY
Glucose-Capillary: 160 mg/dL — ABNORMAL HIGH (ref 70–99)
Glucose-Capillary: 200 mg/dL — ABNORMAL HIGH (ref 70–99)
Glucose-Capillary: 179 mg/dL — ABNORMAL HIGH (ref 70–99)
Glucose-Capillary: 148 mg/dL — ABNORMAL HIGH (ref 70–99)

## 2020-01-27 LAB — C-REACTIVE PROTEIN: CRP: 8.2 mg/dL — ABNORMAL HIGH (ref <1.0)

## 2020-01-27 LAB — MAGNESIUM
Magnesium: 2.8 mg/dL — ABNORMAL HIGH (ref 1.7–2.4)
Magnesium: 3.3 mg/dL — ABNORMAL HIGH (ref 1.7–2.4)

## 2020-01-27 LAB — MRSA PCR SCREENING: MRSA by PCR: NEGATIVE

## 2020-01-27 LAB — FERRITIN: Ferritin: 642 ng/mL — ABNORMAL HIGH (ref 24–336)

## 2020-01-27 MED ORDER — PRISMASOL BGK 4/2.5 32-4-2.5 MEQ/L REPLACEMENT SOLN: Status: DC

## 2020-01-27 MED ORDER — PHENOL 1.4 % MT LIQD
1.0000 | OROMUCOSAL | Status: DC | PRN
  Administered 2020-01-27: 1 via OROMUCOSAL
  Filled 2020-01-27: qty 177

## 2020-01-27 MED ORDER — CALCIUM GLUCONATE-NACL 1-0.675 GM/50ML-% IV SOLN
1.0000 g | Freq: Once | INTRAVENOUS | Status: AC
  Administered 2020-01-27: 1000 mg via INTRAVENOUS
  Filled 2020-01-27: qty 50

## 2020-01-27 MED ORDER — DEXTROSE 5 % IV SOLN: INTRAVENOUS | Status: DC

## 2020-01-27 MED ORDER — PANTOPRAZOLE SODIUM 40 MG PO PACK
40.0000 mg | PACK | Freq: Every day | ORAL | Status: DC
  Administered 2020-01-27: 40 mg

## 2020-01-27 MED ORDER — HEPARIN SODIUM (PORCINE) 1000 UNIT/ML DIALYSIS
1000.0000 [IU] | INTRAMUSCULAR | Status: DC | PRN
  Administered 2020-01-29: 02:00:00 2800 [IU] via INTRAVENOUS_CENTRAL
  Filled 2020-01-27 (×3): qty 6

## 2020-01-27 MED ORDER — PRISMASOL BGK 0/2.5 32-2.5 MEQ/L IV SOLN
INTRAVENOUS | Status: DC
  Filled 2020-01-27 (×12): qty 5000

## 2020-01-27 MED ORDER — HEPARIN (PORCINE) 25000 UT/250ML-% IV SOLN
850.0000 [IU]/h | INTRAVENOUS | Status: DC
  Administered 2020-01-28: 15:00:00 850 [IU]/h via INTRAVENOUS
  Filled 2020-01-27 (×2): qty 250

## 2020-01-27 MED ORDER — SODIUM CHLORIDE 0.9 % FOR CRRT
INTRAVENOUS_CENTRAL | Status: DC | PRN
  Filled 2020-01-27: qty 1000

## 2020-01-27 NOTE — Progress Notes (Signed)
[facility] for IV Heparin  Indication: DVT  Assessment: 28 YOM with COVID-19 pneumonia. Dopplers revealed an acute L-DVT and R-superficial thrombosis. Pharmacy consulted for Lovenox on 2/16, but patient now with worsening renal function. Discussed with Dr. Sherral Hammers - Will transition to IV Heparin at this time.   Heparin level slightly elevated at 0.79 units/ml   Goal of Therapy:  Heparin level 0.3-0.7 units/ml Monitor platelets by anticoagulation protocol: Yes   Plan:  - Decrease heparin drip to 1100 units/hr  Check heparin level in 6-8 hours - Daily heparin level, CBC - Monitor for s/sx of bleeding  Thank you for involving pharmacy in this patient's care.  Excell Seltzer, PharmD Clinical Pharmacist

## 2020-01-27 NOTE — Progress Notes (Signed)
Inpatient Rehabilitation Admissions Coordinator  Inpatient rehab prescreen per PT recs. OT Recs SNF. Noted COVID +2/12. I will follow his progress to assist with planning dispo when appropriate. Noted CRRT.   Danne Baxter, RN, MSN Rehab Admissions Coordinator [phone] 01/27/2020 5:07 PM

## 2020-01-27 NOTE — Progress Notes (Signed)
NAME:  Christopher Travis, MRN:  [id number], DOB:  03/19/1946, LOS: 9 ADMISSION DATE:  01/15/2020, CONSULTATION DATE:  01/26/20 REFERRING MD:  Sherral Hammers, CHIEF COMPLAINT:  dyspnea   Brief History   74 y/o male presented on 2/12 in the setting of COVID 19 pneumonia.  He indicated on admission that he desired code status to be DNR.  He developed worsening renal failure and was started on CRRT on 2/20.  History of present illness   This is a pleasant 74 y/o male with multiple medical problems who presented on 2/12 in the setting of COVID 19 pneumonia.  He indicated on admission that he desired code status to be DNR.  He developed worsening renal failure and was started on CRRT on 2/20.  On 2/21 he was started on precedex and levophed.  The patient could not provide more history on 2/22 due to confusion.  The patient's family desires full supportive measures.    Past Medical History  Parkinson's disease Non-Hodgkins lymphoma Hypertension  Significant Hospital Events   2/12 admission to ICU 2/14 transfer to floor 2/20 transfer back to ICU for CRRT 2/21 start precedex and levophed  Consults:  Nephrology PCCM  Procedures:  2/20 L IJ HD cath >   Significant Diagnostic Tests:  2/17 CT head > NAICP 2/18 renal ultrasound > no acute focal abnormality  Micro Data:  2/12 blood > coag neg staph 1/4  213 sputum > not collected 2/17 blood >  2/19 urine >   Antimicrobials/COVID Rx:  2/12 azithro > 2/16 2/12 ceftriaxone > 2/16 2/12 remdesivir > 2/16  2/15 tocilizumab x1  2/18 cefepime >  2/18 fulconazole >   Interim history/subjective:  WBC up significantly On levophed Off precedex this morning  Objective   Blood pressure 114/62, pulse (!) 105, temperature (!) 96.8 F (36 C), resp. rate (!) 31, height 5\' 11"  (1.803 m), weight 80.9 kg, SpO2 95 %.    FiO2 (%):  [100 %] 100 %   Intake/Output Summary (Last 24 hours) at 01/27/2020 0757 Last data filed at 01/27/2020 0700 Gross per 24 hour    Intake 1484.99 ml  Output 1089 ml  Net 395.99 ml   Filed Weights   01/25/20 0500 01/26/20 0500 01/27/20 0500  Weight: 85.2 kg 83.1 kg 80.9 kg    Examination:  General:  Resting comfortably in bed, tachypnea, very weak HENT: NCAT OP clear PULM: Crackles bases, increased effort CV: RRR, no mgr GI: BS+, soft, nontender MSK: normal bulk and tone Neuro: awake, will answer some simple questions, follow simple commands   Resolved Hospital Problem list     Assessment & Plan:  Acute metabolic encephalopathy due to mult-organ failure, shock, hypoxemia Minimize sedatives Out of bed as able Frequent orientation Lights on during daytime, off at night  Acute hypoxemic respiratory failure due to COVID 19 pneumonia Wean off heated high flow oxygen to maintain O2 saturation > 85% Tolerate periods of hypoxemia, goal at rest is greater than 85% SaO2, with movement ideally above 75% DNR status Out of bed to chair as able Incentive spirometry is important, use every hour Hold prone positioning as on CRRT  AKI Continue CRRT Monitor BMET and UOP Replace electrolytes as needed  Shock Wean off levophed for MAP > 65 Monitor hemodynamics  Overall prognosis poor  Code status DNR.  The family has indicated that they want "everything done", however on admission the patient indicated he did not want mechanical life support or CPR.  His overall prognosis is  poor at his age with this prolonged acute illness and multi-organ failure.  It would be medically inappropriate to intubate the patient should they request this or request to change his code status back to full code as he would not survive mechanical ventilation or CPR in the event of a cardiac arrest.    Best practice:   Per [facility]  Labs   CBC: Recent Labs  Lab 01/23/20 0020 01/24/20 0204 01/25/20 0546 01/26/20 0525 01/27/20 0440  WBC 31.6* 32.8* 38.2* 46.9* 54.6*  NEUTROABS 28.8* 31.3* 36.3* 43.5* 51.0*  HGB 16.5 15.3 16.4 14.9  15.6  HCT 48.0 45.0 49.5 44.2 46.0  MCV 93.2 93.8 94.8 94.4 93.7  PLT 125* 110* 135* 147* [id number]    Basic Metabolic Panel: Recent Labs  Lab 01/23/20 1658 01/23/20 1658 01/24/20 0204 01/24/20 0204 01/25/20 0546 01/25/20 1615 01/26/20 0525 01/26/20 1620 01/27/20 0440  NA 152*   < > 151*   < > 152* 153* 151* 145 142  K 4.1   < > 4.3   < > 4.8 5.2* 4.9 5.2* 5.0  CL 111   < > 111   < > 112* 116* 112* 110 104  CO2 21*   < > 22   < > 18* 18* 22 23 18*  GLUCOSE 195*   < > 196*   < > 222* 219* 118* 137* 156*  BUN 150*   < > 147*   < > 185* 181* 62* 65* 83*  CREATININE 3.44*   < > 3.44*   < > 4.11* 3.97* 2.94* 2.11* 2.02*  CALCIUM 8.1*   < > 8.0*   < > 7.8* 7.7* 7.7* 7.4* 8.2*  MG 2.7*  --  2.6*  --  3.3*  --  3.0*  --  2.8*  PHOS  --   --  6.7*   < > 6.3* 5.5* 4.1 4.1 4.7*   < > = values in this interval not displayed.   GFR: Estimated Creatinine Clearance: 33.7 mL/min (A) (by C-G formula based on SCr of 2.02 mg/dL (H)). Recent Labs  Lab 01/21/20 0544 01/21/20 0544 01/22/20 0249 01/23/20 0020 01/24/20 0204 01/25/20 0546 01/26/20 0525 01/27/20 0440  PROCALCITON 2.20  --  3.18  --   --   --   --   --   WBC 34.3*   < > 29.6*   < > 32.8* 38.2* 46.9* 54.6*   < > = values in this interval not displayed.    Liver Function Tests: Recent Labs  Lab 01/23/20 0020 01/23/20 0020 01/24/20 0204 01/24/20 0204 01/25/20 0546 01/25/20 1615 01/26/20 0525 01/26/20 1620 01/27/20 0440  AST 58*  --  39  --  92*  --  100*  --  154*  ALT 7  --  9  --  15  --  14  --  20  ALKPHOS 209*  --  202*  --  239*  --  225*  --  293*  BILITOT 1.6*  --  1.7*  --  3.3*  --  3.9*  --  5.4*  PROT 6.1*  --  5.8*  --  6.7  --  6.0*  --  6.5  ALBUMIN 3.0*   < > 2.9*   < > 3.1* 2.9* 2.9* 2.7* 2.9*   < > = values in this interval not displayed.   No results for input(s): LIPASE, AMYLASE in the last 168 hours. No results for input(s): AMMONIA in the last 168 hours.  ABG  Component Value Date/Time    PHART 7.470 (H) 01/20/2020 0535   PCO2ART 28.4 (L) 01/20/2020 0535   PO2ART 37.0 (LL) 01/20/2020 0535   HCO3 20.7 01/20/2020 0535   TCO2 22 01/20/2020 0535   ACIDBASEDEF 2.0 01/20/2020 0535   O2SAT 76.0 01/20/2020 0535     Coagulation Profile: Recent Labs  Lab 01/27/20 0440  INR 1.3*    Cardiac Enzymes: No results for input(s): CKTOTAL, CKMB, CKMBINDEX, TROPONINI in the last 168 hours.  HbA1C: No results found for: HGBA1C  CBG: Recent Labs  Lab 01/26/20 0215 01/26/20 0846 01/26/20 1206 01/26/20 1630 01/26/20 2315  GLUCAP 140* 137* 131* 128* 114*    Review of Systems:   Cannot obtain due to confusion  Past Medical History  He,  has a past medical history of Hypertension, Non Hodgkin's lymphoma ([place]), and Parkinson disease ([place]).   Surgical History   History reviewed. No pertinent surgical history.   Social History   reports that he has never smoked. He has never used smokeless tobacco. He reports previous alcohol use. He reports that he does not use drugs.   Family History   His family history includes Liver cancer in his mother.   Allergies Allergies  Allergen Reactions  . Iodine Swelling     Home Medications  Prior to Admission medications   Medication Sig Start Date End Date Taking? Authorizing Provider  amLODipine-valsartan (EXFORGE) 5-320 MG tablet Take 1 tablet by mouth daily. 12/19/19  Yes [provider]  carbidopa-levodopa (SINEMET CR) 50-200 MG tablet Take 2 tablets by mouth daily at 10 pm. 11/15/19  Yes [provider]  carbidopa-levodopa (SINEMET IR) 25-100 MG tablet Take 1-2 tablets by mouth See admin instructions. Take 2 tablets by mouth at 0700, 2 tablets by mouth at noon, take 2 tablets by mouth at 1700, & take 1 tablet at 2200   Yes [provider]  Cholecalciferol (VITAMIN D3 PO) Take 1 tablet by mouth daily.   Yes [provider]  Cyanocobalamin (VITAMIN B-12 PO) Take 1 tablet by mouth daily.   Yes  [provider]  rOPINIRole (REQUIP) 3 MG tablet Take 3 mg by mouth daily. (0700) 11/15/19  Yes [provider]     Critical care time: 35 minutes    Roselie Awkward, MD [facility] Pager: [phone] Cell: [phone] If no response, call [phone]

## 2020-01-27 NOTE — Progress Notes (Addendum)
PROGRESS NOTE    Christopher Travis  VEL:[id number] DOB: 16-Sep-1946 DOA: 01/11/2020 PCP: System, Pcp Not In   Brief Narrative:  Christopher Travis is an 74 y.o.  Hispanic male PMHx Parkinson's disease and non-Hodgkin's lymphoma in remission   who developed subjective fever malaise about 1 week prior to admission, started developing shortness of breath about 2 to 3 days ago at home his sats began reading 80% presented to [facility] found to be hypoxic,  Tachypneic chest x-ray showed bilateral infiltrates, SARS-CoV-2 PCR, his creatinine was 1.3 (up from baseline 1.0) elevated bili [place]en with a leukocytosis lactic acid of 4.3 procalcitonin 2.5 D-dimer 1.28 blood cultures ordered was fluid resuscitated started on IV Decadron on remdesivir.   Subjective: 2/22 afebrile overnight Precedex titrated off, patient more alert answering yes and no questions, appears appropriately.    Assessment & Plan:   Principal Problem:   Pneumonia due to COVID-19 virus Active Problems:   Parkinson disease ([place])   Hypertension   AKI (acute kidney injury) ([place])   Severe sepsis ([place])   Hyperbilirubinemia   Acute respiratory failure with hypoxia ([place])   Parkinson's disease ([place])   Palliative care encounter  Covid pneumonia/Acute Respiratory Failure with hypoxia/CAP COVID-19 Labs  Recent Labs    01/25/20 0546 01/26/20 0525 01/27/20 0440  DDIMER >20.00* 9.90* 12.96*  FERRITIN 2,386* 2,343* 642*  CRP 13.1* 9.9* 8.2*    2/12 SARS coronavirus 2 positive  Covid pneumonia/acute respiratory failure with hypoxia/HCAP -2/13 Covid convalescent plasma x2 doses -Decadron 6 mg daily -Remdesivir per pharmacy protocol -2/15 Actemra x1 dose -Titrate O2 to maintain SPO2 85% if possible.  Patient appears to be happy hypoxic doing well low 80s -Prone patient 16 hours/day; if cannot tolerate prone 2 to 3 hours per shift -2/19 Fentanyl 50 mcg q 4 hr PRN -2/20 PCXR; multi lobar pneumonia see results below -2/22 Precedex  DCd Precedex started overnight.  Continue 1 more night.  Goal RASS score 0 to -1 -2/21 Levophed started overnight; titrate off as tolerated. MAP goal >60  Severe Sepsis -Unknown source HCAP vs bacteremia vs UTI vs unk source vs secondary to acute DVT -Leukocytosis with negative fever, bands or left shift -2/17 PCXR; worsening groundglass opacification see results below -2/18 patient's respiratory status continues to deteriorate will DC CAP coverage and start HCAP coverage x7 days. -2/22 respiratory status stabilized.  Patient now on [place] only; per RN at night occasionally requires addition of NRB to maintain sats.  Thrush -Fluconazole IV complete 5-day course   Acute LEFT lower extremity DVT/Acute RIGHT lower extremity SVT -Treatment dose heparin drip. -Unable to verify if patient has PE spoke with daughter who confirms has true iodine allergy, therefore no CTA PE protocol.  Chronic diastolic CHF -Strict in and out +7.7 L; patient only produced 135m urine last 24 hours -Daily weight Filed Weights   01/25/20 0500 01/26/20 0500 01/27/20 0500  Weight: 85.2 kg 83.1 kg 80.9 kg  -D5W 562mhr  Essential hypertension: Hypotension -Holding antihypertensive medication due to the sepsis picture. -Patient has had multiple episode of tachycardia/bradycardia.  Sick sinus syndrome?   -Echocardiogram positive for chronic diastolic CHF see results below -2/22 Levophed 2/21>>, patient continues to need Levophed to maintain MAP> 60 especially at night.  Continue to attempt to titrate off, MAP goal> 60  Sick sinus syndrome? -Bradycardia has resolved therefore rules out sick sinus syndrome.  Chronic diastolic CHF see above  Acute kidney injury: Recent Labs  Lab 01/25/20 0546 01/25/20 1615 01/26/20 0525  01/26/20 1620 01/27/20 0440  CREATININE 4.11* 3.97* 2.94* 2.11* 2.02*  -See CHF -2/18 renal ultrasound nondiagnostic see results below -2/18 Lasix IV 60 mg x 1 -Patient's renal status has  continued to worsen patient now uremic.  Spoke with Dr. Rodman Key nephrology about initiating CRRT.  She concurs patient would benefit from CRRT. -2/21 improvement with CRRT.  Continue CRRT per nephrology  Uremic Encephalopathy -Trend BUN RResults for Christopher Travis (MRN [id number]) as of 01/27/2020 08:19  Ref. Range 01/25/2020 05:46 01/25/2020 16:15 01/26/2020 05:25 01/26/2020 16:20 01/27/2020 04:40  BUN Latest Ref Range: 8 - 23 mg/dL 185 (H) 181 (H) 62 (H) 65 (H) 83 (H)  -Alert appears to be appropriately answering questions with nod of his head.    Hyperbilirubinemia/Liver failure -Patient's bilirubin continues to climb Results for Christopher Travis (MRN [id number]) as of 01/27/2020 08:19  Ref. Range 01/23/2020 00:20 01/24/2020 02:04 01/25/2020 05:46 01/26/2020 05:25 01/27/2020 04:40  Total Bilirubin Latest Ref Range: 0.3 - 1.2 mg/dL 1.6 (H) 1.7 (H) 3.3 (H) 3.9 (H) 5.4 (H)  -Trend INR -Avoid hepatotoxic medication  Parkinson's disease -At home on combination of Sinemet IR 50-200 and  Sinemet CR 100-488m.  -Sinemet per pharmacy.  Changes will be made to home dosing secondary to Sinemet CR not being able to be crushed and placed down tube  Hypernatremia -Resolved  Thrombocytopenia Results for Christopher Travis(MRN [id number] as of 01/27/2020 08:19  Ref. Range 01/23/2020 00:20 01/24/2020 02:04 01/25/2020 05:46 01/26/2020 05:25 01/27/2020 04:40  Platelets Latest Ref Range: 150 - 400 K/uL 125 (L) 110 (L) 135 (L) 147 (L) 191  -Resolved  Leukocytosis -2/21 most likely leukemoid reaction/demargination.   -2/22 positive mild left shift.  Negative fever, patient has been hypothermic.  Cultures to date NGTD see results below -2/22 phone consult Dr. CCarlyle Basques ID discussed case concurs that most likely leukemoid reaction. Results for Christopher Travis(MRN [id number] as of 01/27/2020 08:19  Ref. Range 01/20/2020 01:34 01/21/2020 05:44 01/22/2020 02:49 01/23/2020 00:20 01/24/2020 02:04 01/25/2020 05:46 01/26/2020 05:25  01/27/2020 04:40  WBC Latest Ref Range: 4.0 - 10.5 K/uL 21.4 (H) 34.3 (H) 29.6 (H) 31.6 (H) 32.8 (H) 38.2 (H) 46.9 (H) 54.6 (HH)   Dysphagia -Tube feeds     Goals of care/ethics: I had a long discussion with the family the patient on 01/19/2020, expressed that he did not want to be intubated or resuscitated this was expressed to the family.  I have expressed to the family my concerns about his extremely poor prognosis they would like to talk it over and give it a few more hours to see if he turns around to have explained to him that it is unlikely but we would do as they wish.  We will call him again this afternoon. -2/17 consult palliative care; patient with extremely poor prognosis family does not want to escalate care.  Comfort care vs home hospice vs residential hospice  -2/20 again spoke with family who continues to push for reversal of DNR.  Again informed family that if they could convince patient to reverse DNR then we will honor that request.  Unfortunately currently patient uremic A/O x1, unable to have an in-depth conversation with family concerning reversal of his previous DNR request.  CFrance(daughter) is adamant that father did not understand when he made himself DNR because his native language is Spanish, however Dr. ATammi Klippelphysician who explained CODE STATUS to patient native language is Spanish.   2/21 patient now on CRRT x24 hours patient with extremely  poor prognosis for surviving hospitalization    DVT prophylaxis: Lovenox treatment dose Code Status: DNR Family Communication: 2/21 Advised daughter [name] (daughter) and family renal status has continued to deteriorate and need to begin CRRT.  Answered all questions  Disposition Plan: TBD   Consultants:  Dr. Rodman Key nephrology  PCCM Dr. Lake Bells phone consult Dr. Carlyle Basques, ID   Procedures/Significant Events:  2/13 Covid convalescent plasma x2 doses 2/15 Actemra x1 dose 2/16 bilateral lower extremity  Doppler;RIGHT: Positive acute SVT involving the  right great saphenous vein, and right varicosities or other superficial veins.  LEFT: Positive acute DVT involving the left gastrocnemius veins.  2/17 PCXR; low lung volume with worsening of LEFT>> RIGHT interstitial and ground-glass opacities concerning for bilateral pneumonia 2/18 echocardiogram;.Left Ventricle: EF= 60 -65%. The left ventricle has normal function. The left ventricle has no regional wall motion abnormalities.  -Grade I diastolic dysfunction (impaired relaxation).  -There is mildly elevated pulmonary artery systolic pressure.    2/19 renal US; No acute or focal abnormality identified. No hydronephrosis. 4 cm simple cyst left kidney 2/20; Precedex started overnight 2/21 PCXR;-multilobar bilateral pneumonia with similar aeration to the prior Study -Left internal jugular Vas-Cath with tip terminating at the superior cavoatrial junction. 2/21 NG tube placed Levophed 2/21>> Precedex to 2/21>> 2/22  I have personally reviewed and interpreted all radiology studies and my findings are as above.  VENTILATOR SETTINGS: HHFNC 2/22 FiO2 100% Flow; 40 L/min SPO2; 93%   Cultures 2/12 blood 1/2 positive coag negative staph most likely contaminant 2/12 SARS coronavirus 2 positive 2/17 sputum pending 2/17 Blood RIGHT AC negative final 2/17 blood LEFT AC negative final 2/18 MRSA by PCR negative 2/19 urine negative 2/20 acute hepatitis panel pending 2/20 HIV panel negative    Antimicrobials: Anti-infectives (From admission, onward)   Start     Dose/Rate Stop   01/25/20 2200  ceFEPIme (MAXIPIME) 2 g in sodium chloride 0.9 % 100 mL IVPB     2 g 200 mL/hr over 30 Minutes February 11, 2020 2359   01/24/20 1000  fluconazole (DIFLUCAN) IVPB 100 mg     100 mg 50 mL/hr over 60 Minutes     01/23/20 1630  fluconazole (DIFLUCAN) IVPB 200 mg     200 mg 100 mL/hr over 60 Minutes 01/23/20 1924   01/23/20 0800  ceFEPIme (MAXIPIME) 2 g in sodium  chloride 0.9 % 100 mL IVPB  Status:  Discontinued     2 g 200 mL/hr over 30 Minutes 01/25/20 1433   01/18/20 1000  remdesivir 100 mg in sodium chloride 0.9 % 100 mL IVPB     100 mg 200 mL/hr over 30 Minutes 01/21/20 1106   01/18/20 0400  azithromycin (ZITHROMAX) 500 mg in sodium chloride 0.9 % 250 mL IVPB     500 mg 250 mL/hr over 60 Minutes 01/22/20 0422   01/18/20 0300  cefTRIAXone (ROCEPHIN) 2 g in sodium chloride 0.9 % 100 mL IVPB     2 g 200 mL/hr over 30 Minutes 01/22/20 0317   01/08/2020 2045  remdesivir 100 mg in sodium chloride 0.9 % 100 mL IVPB     100 mg 200 mL/hr over 30 Minutes 01/28/2020 2232       Devices NG tube placed 2/21>>   LINES  LEFT IJ Vas-Cath 2/20>>    Continuous Infusions: .  prismasol BGK 4/2.5 500 mL/hr at 01/26/20 0601  .  prismasol BGK 4/2.5 200 mL/hr at 01/25/20 1331  . sodium chloride    .  ceFEPime (MAXIPIME) IV 2 g (01/27/20 0810)  . dexmedetomidine (PRECEDEX) IV infusion Stopped (01/27/20 0523)  . dextrose Stopped (01/24/20 1939)  . fluconazole (DIFLUCAN) IV Stopped (01/26/20 1048)  . heparin 1,100 Units/hr (01/27/20 0700)  . norepinephrine (LEVOPHED) Adult infusion 4 mcg/min (01/27/20 0700)  . prismasol BGK 4/2.5 1,000 mL/hr at 01/27/20 0427     Objective: Vitals:   01/27/20 0715 01/27/20 0730 01/27/20 0745 01/27/20 0800  BP: 111/63 123/74 (!) 113/30 115/65  Pulse: (!) 110 74 (!) 52 (!) 111  Resp: (!) 35 (!) 31 (!) 28 (!) 33  Temp: (!) 96.8 F (36 C) (!) 97 F (36.1 C) (!) 97 F (36.1 C) (!) 97 F (36.1 C)  TempSrc:      SpO2: 96% 93% 94% 93%  Weight:      Height:        Intake/Output Summary (Last 24 hours) at 01/27/2020 3267 Last data filed at 01/27/2020 0700 Gross per 24 hour  Intake 1445.27 ml  Output 1124 ml  Net 321.27 ml   Filed Weights   01/25/20 0500 01/26/20 0500 01/27/20 0500  Weight: 85.2 kg 83.1 kg 80.9 kg    Physical Exam:  General: Alert nods yes and no to questions, positive acute respiratory  distress Eyes: negative scleral hemorrhage, negative anisocoria, negative icterus ENT: Negative Runny nose, negative gingival bleeding, Neck:  Negative scars, masses, torticollis, lymphadenopathy, JVD, LEFT IJ Vas-Cath properly clean negative sign of infection Lungs: Tachypneic clear to auscultation bilaterally without wheezes or crackles Cardiovascular: Tachycardic without murmur gallop or rub normal S1 and S2 Abdomen: negative abdominal pain, nondistended, positive soft, bowel sounds, no rebound, no ascites, no appreciable mass Extremities: No significant cyanosis, clubbing, or edema bilateral lower extremities Skin: Negative rashes, lesions, ulcers Psychiatric: Unable to assess fully patient appears to still have some confusion, nonverbal. Central nervous system:  Cranial nerves II through XII intact, tongue/uvula midline, all extremities muscle strength 5/5, sensation intact throughout, negative expressive aphasia, negative receptive aphasia.      Data Reviewed: Care during the described time interval was provided by me .  I have reviewed this patient's available data, including medical history, events of note, physical examination, and all test results as part of my evaluation.   CBC: Recent Labs  Lab 01/23/20 0020 01/24/20 0204 01/25/20 0546 01/26/20 0525 01/27/20 0440  WBC 31.6* 32.8* 38.2* 46.9* 54.6*  NEUTROABS 28.8* 31.3* 36.3* 43.5* 51.0*  HGB 16.5 15.3 16.4 14.9 15.6  HCT 48.0 45.0 49.5 44.2 46.0  MCV 93.2 93.8 94.8 94.4 93.7  PLT 125* 110* 135* 147* 124   Basic Metabolic Panel: Recent Labs  Lab 01/23/20 1658 01/23/20 1658 01/24/20 0204 01/24/20 0204 01/25/20 0546 01/25/20 1615 01/26/20 0525 01/26/20 1620 01/27/20 0440  NA 152*   < > 151*   < > 152* 153* 151* 145 142  K 4.1   < > 4.3   < > 4.8 5.2* 4.9 5.2* 5.0  CL 111   < > 111   < > 112* 116* 112* 110 104  CO2 21*   < > 22   < > 18* 18* 22 23 18*  GLUCOSE 195*   < > 196*   < > 222* 219* 118* 137* 156*    BUN 150*   < > 147*   < > 185* 181* 62* 65* 83*  CREATININE 3.44*   < > 3.44*   < > 4.11* 3.97* 2.94* 2.11* 2.02*  CALCIUM 8.1*   < > 8.0*   < >  7.8* 7.7* 7.7* 7.4* 8.2*  MG 2.7*  --  2.6*  --  3.3*  --  3.0*  --  2.8*  PHOS  --   --  6.7*   < > 6.3* 5.5* 4.1 4.1 4.7*   < > = values in this interval not displayed.   GFR: Estimated Creatinine Clearance: 33.7 mL/min (A) (by C-G formula based on SCr of 2.02 mg/dL (H)). Liver Function Tests: Recent Labs  Lab 01/23/20 0020 01/23/20 0020 01/24/20 0204 01/24/20 0204 01/25/20 0546 01/25/20 1615 01/26/20 0525 01/26/20 1620 01/27/20 0440  AST 58*  --  39  --  92*  --  100*  --  154*  ALT 7  --  9  --  15  --  14  --  20  ALKPHOS 209*  --  202*  --  239*  --  225*  --  293*  BILITOT 1.6*  --  1.7*  --  3.3*  --  3.9*  --  5.4*  PROT 6.1*  --  5.8*  --  6.7  --  6.0*  --  6.5  ALBUMIN 3.0*   < > 2.9*   < > 3.1* 2.9* 2.9* 2.7* 2.9*   < > = values in this interval not displayed.   No results for input(s): LIPASE, AMYLASE in the last 168 hours. No results for input(s): AMMONIA in the last 168 hours. Coagulation Profile: Recent Labs  Lab 01/27/20 0440  INR 1.3*   Cardiac Enzymes: No results for input(s): CKTOTAL, CKMB, CKMBINDEX, TROPONINI in the last 168 hours. BNP (last 3 results) No results for input(s): PROBNP in the last 8760 hours. HbA1C: No results for input(s): HGBA1C in the last 72 hours. CBG: Recent Labs  Lab 01/26/20 0846 01/26/20 1206 01/26/20 1630 01/26/20 2315 01/27/20 0757  GLUCAP 137* 131* 128* 114* 160*   Lipid Profile: No results for input(s): CHOL, HDL, LDLCALC, TRIG, CHOLHDL, LDLDIRECT in the last 72 hours. Thyroid Function Tests: No results for input(s): TSH, T4TOTAL, FREET4, T3FREE, THYROIDAB in the last 72 hours. Anemia Panel: Recent Labs    01/26/20 0525 01/27/20 0440  FERRITIN 2,343* 642*   Urine analysis: No results found for: COLORURINE, APPEARANCEUR, LABSPEC, PHURINE, GLUCOSEU, HGBUR,  BILIRUBINUR, KETONESUR, PROTEINUR, UROBILINOGEN, NITRITE, LEUKOCYTESUR Sepsis Labs: @LABRCNTIP (procalcitonin:4,lacticidven:4)  ) Recent Results (from the past 240 hour(s))  Blood culture (routine x 2)     Status: Abnormal   Collection Time: 01/12/2020  7:25 PM   Specimen: BLOOD  Result Value Ref Range Status   Specimen Description   Final    BLOOD BLOOD LEFT FOREARM Performed at [facility], [address] [place], [place] [address]    Special Requests   Final    BOTTLES DRAWN AEROBIC AND ANAEROBIC Blood Culture adequate volume Performed at [facility], [address] [place], [place] [address]    Culture  Setup Time   Final    GRAM POSITIVE COCCI AEROBIC BOTTLE ONLY CRITICAL RESULT CALLED TO, READ BACK BY AND VERIFIED WITH: PHARMD MAY BALL @1501  01/18/20 AKT    Culture (A)  Final    STAPHYLOCOCCUS SPECIES (COAGULASE NEGATIVE) THE SIGNIFICANCE OF ISOLATING THIS ORGANISM FROM A SINGLE SET OF BLOOD CULTURES WHEN MULTIPLE SETS ARE DRAWN IS UNCERTAIN. PLEASE NOTIFY THE MICROBIOLOGY DEPARTMENT WITHIN ONE WEEK IF SPECIATION AND SENSITIVITIES ARE REQUIRED. Performed at [facility], [place] [address] [place],  [address]    Report Status 01/19/2020 FINAL  Final  Blood culture (routine x 2)     Status: None   Collection Time: 01/25/2020  7:54 PM   Specimen: BLOOD  Result Value Ref Range Status   Specimen Description   Final    BLOOD RIGHT ANTECUBITAL Performed at [facility], [address] [place], [place] [address]    Special Requests   Final    BOTTLES DRAWN AEROBIC AND ANAEROBIC Blood Culture adequate volume Performed at [facility], [address] [place], [place] [address]    Culture   Final    NO GROWTH 5 DAYS Performed at [facility], [place] [address], [place], [place] [address]    Report Status 01/22/2020 FINAL  Final  SARS Coronavirus 2 Ag (30 min TAT) - Nasal Swab (BD Veritor Kit)     Status: Abnormal    Collection Time: 01/14/2020  7:59 PM   Specimen: Nasal Swab (BD Veritor Kit)  Result Value Ref Range Status   SARS Coronavirus 2 Ag POSITIVE (A) NEGATIVE Final    Comment: RESULT CALLED TO, READ BACK BY AND VERIFIED WITH: KELLY NEAL RN @2034  01/26/2020 OLSONM (NOTE) SARS-CoV-2 antigen PRESENT. Positive results indicate the presence of viral antigens, but clinical correlation with patient history and other diagnostic information is necessary to determine patient infection status.  Positive results do not rule out bacterial infection or co-infection  with other viruses. False positive results are rare but can occur, and confirmatory RT-PCR testing may be appropriate in some circumstances. The expected result is Negative. Fact Sheet for Patients: PodPark.tn Fact Sheet for Providers: GiftContent.is  This test is not yet approved or cleared by the [place] FDA and  has been authorized for detection and/or diagnosis of SARS-CoV-2 by FDA under an Emergency Use Authorization (EUA).  This EUA will remain in effect (meaning this test can be used) for the duration of  the COVID-19 decla ration under Section 564(b)(1) of the Act, 21 U.S.C. section 360bbb-3(b)(1), unless the authorization is terminated or revoked sooner. Performed at [facility], [address] [place], [place] [address]   MRSA PCR Screening     Status: None   Collection Time: 01/20/20  9:40 AM   Specimen: Nasopharyngeal  Result Value Ref Range Status   MRSA by PCR NEGATIVE NEGATIVE Final    Comment:        The GeneXpert MRSA Assay (FDA approved for NASAL specimens only), is one component of a comprehensive MRSA colonization surveillance program. It is not intended to diagnose MRSA infection nor to guide or monitor treatment for MRSA infections. Performed at [facility], [place] [address], [place], [place] [address]   Culture,  blood (routine x 2)     Status: None (Preliminary result)   Collection Time: 01/22/20  7:54 PM   Specimen: BLOOD  Result Value Ref Range Status   Specimen Description BLOOD RIGHT ANTECUBITAL  Final   Special Requests   Final    BOTTLES DRAWN AEROBIC AND ANAEROBIC Blood Culture adequate volume   Culture   Final    NO GROWTH 4 DAYS Performed at [facility], [place] [address] [place], [place] [address]    Report Status PENDING  Incomplete  Culture, blood (routine x 2)     Status: None (Preliminary result)   Collection Time: 01/22/20  8:04 PM   Specimen: BLOOD  Result Value Ref Range Status   Specimen Description BLOOD LEFT ANTECUBITAL  Final   Special  Requests   Final    BOTTLES DRAWN AEROBIC ONLY Blood Culture results may not be optimal due to an inadequate volume of blood received in culture bottles   Culture   Final    NO GROWTH 4 DAYS Performed at [facility], [place] [address], [place], [place] [address]    Report Status PENDING  Incomplete  MRSA PCR Screening     Status: None   Collection Time: 01/23/20  9:20 AM   Specimen: Nasal Mucosa; Nasopharyngeal  Result Value Ref Range Status   MRSA by PCR NEGATIVE NEGATIVE Final    Comment:        The GeneXpert MRSA Assay (FDA approved for NASAL specimens only), is one component of a comprehensive MRSA colonization surveillance program. It is not intended to diagnose MRSA infection nor to guide or monitor treatment for MRSA infections. Performed at [facility], [place] [address], [place], [place] [address]   Culture, Urine     Status: None   Collection Time: 01/24/20  4:43 PM   Specimen: Urine, Clean Catch  Result Value Ref Range Status   Specimen Description   Final    URINE, CLEAN CATCH Performed at [facility], [place] [address], [place], [place] [address]    Special Requests   Final    NONE Performed at [facility], [place] [address], [place], [place]  [address]    Culture   Final    NO GROWTH Performed at [facility], [place] [address] [place], [place] [address]    Report Status 01/25/2020 FINAL  Final         Radiology Studies: DG Chest Port 1 View  Result Date: 01/25/2020 CLINICAL DATA:  74 year old male status post central line placement. EXAM: PORTABLE CHEST 1 VIEW COMPARISON:  Chest x-ray 01/24/2020. FINDINGS: Previously noted enteric tube has been removed. Left internal jugular Vas-Cath with tip terminating at the superior cavoatrial junction. Lung volumes are low. Widespread ill-defined opacities and areas of interstitial prominence are again noted in the lungs bilaterally (left greater than right), with similar aeration to the prior study. No definite pleural effusions. No pneumothorax. No evidence of pulmonary edema. Heart size is normal. The patient is rotated to the left on today's exam, resulting in distortion of the mediastinal contours and reduced diagnostic sensitivity and specificity for mediastinal pathology. IMPRESSION: 1. Support apparatus, as above. 2. Multilobar bilateral pneumonia with similar aeration to the prior study, as above. Electronically Signed   By: Vinnie Langton M.D.   On: 01/25/2020 13:13        Scheduled Meds: . carbidopa-levodopa  3 tablet Per Tube 3 times per day   And  . carbidopa-levodopa  2 tablet Per Tube QHS  . chlorhexidine  15 mL Mouth Rinse BID  . Chlorhexidine Gluconate Cloth  6 each Topical Daily  . dexamethasone (DECADRON) injection  6 mg Intravenous Q24H  . feeding supplement (ENSURE ENLIVE)  237 mL Oral TID BM  . insulin aspart  0-9 Units Subcutaneous TID WC  . mouth rinse  15 mL Mouth Rinse 10 times per day  . metoprolol tartrate  50 mg Oral BID  . rOPINIRole  3 mg Oral Q24H  . sodium chloride flush  3 mL Intravenous Q12H  . sodium chloride flush  3 mL Intravenous Q12H   Continuous Infusions: .  prismasol BGK 4/2.5 500 mL/hr at 01/26/20 0601  .  prismasol BGK 4/2.5 200  mL/hr at 01/25/20 1331  .  sodium chloride    . ceFEPime (MAXIPIME) IV 2 g (01/27/20 0810)  . dexmedetomidine (PRECEDEX) IV infusion Stopped (01/27/20 0523)  . dextrose Stopped (01/24/20 1939)  . fluconazole (DIFLUCAN) IV Stopped (01/26/20 1048)  . heparin 1,100 Units/hr (01/27/20 0700)  . norepinephrine (LEVOPHED) Adult infusion 4 mcg/min (01/27/20 0700)  . prismasol BGK 4/2.5 1,000 mL/hr at 01/27/20 0427     LOS: 9 days   The patient is critically ill with multiple organ systems failure and requires high complexity decision making for assessment and support, frequent evaluation and titration of therapies, application of advanced monitoring technologies and extensive interpretation of multiple databases. Critical Care Time devoted to patient care services described in this note  Time spent: 40 minutes     Birttany Dechellis, Geraldo Docker, MD [facility] Pager [phone]  If 7PM-7AM, please contact night-coverage [email or web address] Password [email or web address] 01/27/2020, 8:17 AM

## 2020-01-27 NOTE — Progress Notes (Signed)
[facility] Progress Note    Assessment/ Plan:   1.  Acute oliguric kidney injury: Cr up to 4.11 with BUN up to 185. Started CRRT 2/20.  BUN improving, did have some UOP yesterday 175 cc.  All 4K fluids.  Still no vol excess on exam and BP's soft, will go back to keep + 50 cc/hr for now. Not making urine.   2.  Hypernatremia: resolving w/ CRRT  3.  Acute hypoxic RF: d/t COVID and probable PE, on decadron, s/p remdesivir, on cefepime and conazole and hep gtt  4.  Parkinson's: on Sinemet  5.  Thrombocytopenia: plts 105, HIT negative  6.  Elevated LFTs: likely d/t COVID infection, w/u per primary  7.  Dispo: prognosis appears poor.  Palliative care involved, appreciate assistance.    Subjective:    Spoke to pt w/ interpreter however pt is nonverbal and has been according to RN's So interpreter was not helpful really   Objective:   BP (!) 95/38   Pulse 70   Temp 97.9 F (36.6 C)   Resp (!) 30   Ht 5\' 11"  (1.803 m)   Wt 80.9 kg   SpO2 91%   BMI 24.88 kg/m   Intake/Output Summary (Last 24 hours) at 01/27/2020 1347 Last data filed at 01/27/2020 1100 Gross per 24 hour  Intake 1666.69 ml  Output 1423 ml  Net 243.69 ml   Weight change: -2.2 kg  Physical Exam: Seen in ICU, interpreter online but pt doesn't communicate very well, per RN's this isnot new  awake and alert   No jvd   Chest coarse bilat   Cor reg   Abd sofpt scaphoid    Ext miin pedal edema, no other edema   NF  temp HD cath on CRRT    Imaging: No results found.  Labs: BMET Recent Labs  Lab 01/23/20 0020 01/23/20 0020 01/23/20 1658 01/24/20 0204 01/25/20 0546 01/25/20 1615 01/26/20 0525 01/26/20 1620 01/27/20 0440  NA 148*   < > 152* 151* 152* 153* 151* 145 142  K 4.6   < > 4.1 4.3 4.8 5.2* 4.9 5.2* 5.0  CL 108   < > 111 111 112* 116* 112* 110 104  CO2 22   < > 21* 22 18* 18* 22 23 18*  GLUCOSE 205*   < > 195* 196* 222* 219* 118* 137* 156*  BUN 114*   < > 150* 147* 185*  181* 62* 65* 83*  CREATININE 2.69*   < > 3.44* 3.44* 4.11* 3.97* 2.94* 2.11* 2.02*  CALCIUM 8.4*   < > 8.1* 8.0* 7.8* 7.7* 7.7* 7.4* 8.2*  PHOS 4.7*  --   --  6.7* 6.3* 5.5* 4.1 4.1 4.7*   < > = values in this interval not displayed.   CBC Recent Labs  Lab 01/24/20 0204 01/25/20 0546 01/26/20 0525 01/27/20 0440  WBC 32.8* 38.2* 46.9* 54.6*  NEUTROABS 31.3* 36.3* 43.5* 51.0*  HGB 15.3 16.4 14.9 15.6  HCT 45.0 49.5 44.2 46.0  MCV 93.8 94.8 94.4 93.7  PLT 110* 135* 147* 191    Medications:    . carbidopa-levodopa  3 tablet Per Tube 3 times per day   And  . carbidopa-levodopa  2 tablet Per Tube QHS  . chlorhexidine  15 mL Mouth Rinse BID  . Chlorhexidine Gluconate Cloth  6 each Topical Daily  . dexamethasone (DECADRON) injection  6 mg Intravenous Q24H  . feeding supplement (ENSURE ENLIVE)  237 mL Oral TID BM  .  insulin aspart  0-9 Units Subcutaneous TID WC  . mouth rinse  15 mL Mouth Rinse 10 times per day  . metoprolol tartrate  50 mg Oral BID  . rOPINIRole  3 mg Oral Q24H  . sodium chloride flush  3 mL Intravenous Q12H  . sodium chloride flush  3 mL Intravenous Q12H

## 2020-01-27 NOTE — Progress Notes (Signed)
[facility] Progress Note Patient Name: Christopher Travis DOB: Jan 15, 1946 MRN: [id number]   Date of Service  01/27/2020  HPI/Events of Note  Called for desaturation after  meds given per OG tube. RN states OG coiled in throat. Suspects aspiration over the last several days. Had to Increase oxygen from 35 to 40 L, and 90-100% RN to try to replace OG until CorTrak can be placed   eICU Interventions  Orders placed for CorTrak Orders placed for CXR in am to evaluate for aspiration     Intervention Category Intermediate Interventions: Respiratory distress - evaluation and management  Magdalen Spatz 01/27/2020, 10:33 PM

## 2020-01-27 NOTE — Progress Notes (Signed)
Overnight patient had 2 episodes of Afib with RVR/AFlutter which MD was notified. These were accompanied with short non sustained runs of VTach. PRN 5mg  Iv metoprolol was given both times which converted the pt back to SR. BP remained stable. Pt currently on stable CRRT, had one episode of bleeding at the site which may have been due to pt pulling at line. No bleeding persists at the site, and all ports flush and aspirate well. Patient is on 4 of levo, heparin drip being changed to 1100U/hr from 1200U/Hr per pharmacy. Precedex has been shut off early this AM, pt remains calm and awake. Pt oriented x1-3 intermittently, does follow commands. Patient on 40L/100% HFNC.  WBC 56.4 this AM, ELIink MD notified.

## 2020-01-27 NOTE — Progress Notes (Signed)
eLink Physician-Brief Progress Note Patient Name: Christopher Travis DOB: 09-15-1946 MRN: [id number]   Date of Service  01/27/2020  HPI/Events of Note  Bedside RN requesting Protonix ( occult + stool), chloraseptic spray for OG irritation, and CBG Q 4  eICU Interventions  Orders entered.  Asked RN to be careful with chloraseptic as do not want patient to aspirate.     Intervention Category Minor Interventions: Other:  Magdalen Spatz 01/27/2020, 9:26 PM

## 2020-01-27 NOTE — Progress Notes (Addendum)
ANTICOAGULATION CONSULT NOTE - Follow Up Consult  Pharmacy Consult for Heparin IV  Indication: DVT  Allergies  Allergen Reactions  . Iodine Swelling    Patient Measurements: Height: 5\' 11"  (180.3 cm) Weight: 178 lb 5.6 oz (80.9 kg) IBW/kg (Calculated) : 75.3 Heparin Dosing Weight: actual weight  Vital Signs: Temp: 97.9 F (36.6 C) (02/22 1345) Temp Source: Oral (02/22 0400) BP: 106/37 (02/22 1345) Pulse Rate: 71 (02/22 1345)  Labs: Recent Labs    01/25/20 0546 01/25/20 1615 01/26/20 0525 01/26/20 1620 01/27/20 0440 01/27/20 1426  HGB 16.4  --  14.9  --  15.6  --   HCT 49.5  --  44.2  --  46.0  --   PLT 135*  --  147*  --  191  --   LABPROT  --   --   --   --  16.5*  --   INR  --   --   --   --  1.3*  --   HEPARINUNFRC 0.36  --  0.67  --  0.79* 0.73*  CREATININE 4.11*   < > 2.94* 2.11* 2.02*  --    < > = values in this interval not displayed.    Estimated Creatinine Clearance: 33.7 mL/min (A) (by C-G formula based on SCr of 2.02 mg/dL (H)).   Medications:  Infusions:  .  prismasol BGK 4/2.5 500 mL/hr at 01/27/20 1222  .  prismasol BGK 4/2.5 200 mL/hr at 01/25/20 1331  . sodium chloride    . ceFEPime (MAXIPIME) IV 2 g (01/27/20 0810)  . dexmedetomidine (PRECEDEX) IV infusion Stopped (01/27/20 0523)  . dextrose Stopped (01/24/20 1939)  . fluconazole (DIFLUCAN) IV Stopped (01/27/20 1043)  . heparin 1,100 Units/hr (01/27/20 1400)  . norepinephrine (LEVOPHED) Adult infusion Stopped (01/27/20 1027)  . prismasol BGK 4/2.5 1,000 mL/hr at 01/27/20 1416    Assessment: 8 YOM with COVID-19 pneumonia. Dopplers revealed an acute L-DVT and R-superficial thrombosis. Pharmacy consulted for Lovenox on 2/16, but patient now with worsening renal function.  Pharmacy is consulted to transition to IV Heparin.  Heparin level 0.73, remains elevated but decreased on heparin at 1100 units/hr. CBC: H/H stable/wnl and Plt improved to 191. No bleeding or complications reported by  RN. D-dimer increased >20, 9.9, 12.9 Remains on CRRT, no filter clotting noted.  Goal of Therapy:  Heparin level 0.3-0.7 units/ml Monitor platelets by anticoagulation protocol: Yes   Plan:  Decrease to heparin IV infusion at 1000 units/hr Heparin level 8 hours after rate change Daily heparin level and CBC   Gretta Arab PharmD, BCPS Clinical pharmacist phone 7am- 5pm: [phone] 01/27/2020 3:05 PM     Addendum:  01/27/2020 3:52 PM RN reports that after we previously discussed this patient having no bleeding, the patient had a bowel movement that was dark in color and suspicious for blood.  RN is sending Occult stool sample for testing. Pharmacy/RN/MD to follow up results. Gretta Arab PharmD, BCPS Clinical pharmacist phone 7am- 5pm: [phone] 01/27/2020 3:55 PM

## 2020-01-27 NOTE — Progress Notes (Signed)
Occupational Therapy Treatment Patient Details Name: Christopher Travis MRN: [id number] DOB: 1946-07-20 Today's Date: 01/27/2020    History of present illness  74 y.o. male with medical history significant for Parkinson disease and non-Hodgkin lymphoma in remission, who developed subjective fevers and malaise roughly a week ago after a family member with asymptomatic COVID visited him. He went on to develop SOB 2-3 days ago, was monitoring oxygen saturations at home, began to see readings in 80's, and [facility] with acute respiratory failure with hypoxia secondary to COIVD 19 PNA and possibly CAP, sepsis, AKI, hyperbilirubinema.     OT comments  Utilized interpreter for session [phone]). Pt making slow progress in therapy demonstrating limited activity tolerance, ROM, and strength this date. Pt currently on 35L HHFNC at 90% FiO2 with SpO2 92% at rest. Pt able to tolerate chair position ~20 min while engaging in therapy tasks. See BP readings below. Instructed pt on BUE cross body reaching task with pt able to complete x 5. RUE active shld flex limited this date with pt able to provide hand over hand assist with LUE to reach across body with RUE. SpO2 dropped to 84% during task with RR increasing from 20 to 41. Pt required mod rest breaks throughout due to fatigue. Pt able to wash face with LUE and mod verbal and tactile cues. Pt following ~50% of cues this date. Pt left in chair position per RN request. OT will continue to follow acutely.   BP semi-reclined in bed: 106/68mmHg.  HOB 56 degrees with BP: 99/24mmHg.  HOB 56 degrees with BP 8 min later: 135/9mmHg.  HOB 56 degrees with BP 8 min later: 90/70mmHg.  RN present and aware.    Follow Up Recommendations  SNF;Supervision/Assistance - 24 hour    Equipment Recommendations  3 in 1 bedside commode    Recommendations for Other Services      Precautions / Restrictions Precautions Precautions: Fall;Other  (comment) Precaution Comments: CRRT - jugular access. NG tube, Monitor SpO2, Parkinson's with tremors.  Restrictions Weight Bearing Restrictions: No       Mobility Bed Mobility Overal bed mobility: Needs Assistance             General bed mobility comments: Total assist x 2 to scoot up in bed.   Transfers                 General transfer comment: Not attempted this date. Pt able to tolerate chair position ~20 minutes    Balance Overall balance assessment: (Not assessed this date. Pt able to tolerate chair position)                                         ADL either performed or assessed with clinical judgement   ADL Overall ADL's : Needs assistance/impaired     Grooming: Supervision/safety;Cueing for sequencing;Bed level;Wash/dry hands;Wash/dry face                                 General ADL Comments: Pt able to tolerate chair position ~20 minutes     Vision       Perception     Praxis      Cognition Arousal/Alertness: Awake/alert Behavior During Therapy: Flat affect Overall Cognitive Status: Impaired/Different from baseline  Following Commands: Follows one step commands inconsistently;Follows one step commands with increased time       General Comments: Utilized interpreter ID [phone] for session. Per RN, pt has not spoken the last few days. Pt able to nod his head yes and no to questions. Pt following 50% of simple one step commands.         Exercises Exercises: Other exercises Other Exercises Other Exercises: Cross body reaching x 5. Pt provided hand over hand assist to reach with RUE due to limited ROM.    Shoulder Instructions       General Comments Pt on 35L HHFNC at 90% FiO2. SpO2 92% at rest with RR in 20s. SpO2 dropped to 84% during activity with RR in 66s. Pt able to tolerate chair position ~20 min.     Pertinent Vitals/ Pain       Pain Assessment: Faces Faces Pain  Scale: Hurts even more Pain Location: sore throat Pain Descriptors / Indicators: Aching;Constant;Discomfort Pain Intervention(s): Monitored during session(RN aware)  Home Living                                          Prior Functioning/Environment              Frequency           Progress Toward Goals  OT Goals(current goals can now be found in the care plan section)  Progress towards OT goals: Progressing toward goals  ADL Goals Pt Will Perform Grooming: with set-up;with supervision;sitting Pt Will Perform Lower Body Dressing: with set-up;with supervision;sit to/from stand Pt Will Transfer to Toilet: ambulating;bedside commode;with min guard assist Pt Will Perform Toileting - Clothing Manipulation and hygiene: with min guard assist;sit to/from stand;sitting/lateral leans Pt/caregiver will Perform Home Exercise Program: Increased ROM;Increased strength;Both right and left upper extremity;With Supervision Additional ADL Goal #1: Pt will independently verablize three energy conservation techniques for ADLs Additional ADL Goal #2: Pt will monitor SpO2 and utilize purse lip breathing with 2-3 cues during ADLs  Plan Discharge plan remains appropriate    Co-evaluation    PT/OT/SLP Co-Evaluation/Treatment: Yes Reason for Co-Treatment: Complexity of the patient's impairments (multi-system involvement);For patient/therapist safety   OT goals addressed during session: ADL's and self-care;Strengthening/ROM      AM-PAC OT "6 Clicks" Daily Activity     Outcome Measure   Help from another person eating meals?: Total Help from another person taking care of personal grooming?: A Lot Help from another person toileting, which includes using toliet, bedpan, or urinal?: Total Help from another person bathing (including washing, rinsing, drying)?: Total Help from another person to put on and taking off regular upper body clothing?: Total Help from another person to  put on and taking off regular lower body clothing?: Total 6 Click Score: 7    End of Session Equipment Utilized During Treatment: Oxygen  OT Visit Diagnosis: Unsteadiness on feet (R26.81);Other abnormalities of gait and mobility (R26.89);Muscle weakness (generalized) (M62.81)   Activity Tolerance Patient limited by fatigue   Patient Left in bed;with nursing/sitter in room   Nurse Communication Mobility status        Time: [id number] OT Time Calculation (min): 42 min  Charges: OT General Charges $OT Visit: 1 Visit OT Treatments $Therapeutic Activity: 8-22 mins $Therapeutic Exercise: 8-22 mins  Mauri Brooklyn OTR/L [phone]   Mauri Brooklyn 01/27/2020, 2:40 PM

## 2020-01-27 NOTE — Progress Notes (Addendum)
PROGRESS NOTE    Christopher Travis  EHO:[id number] DOB: 01/14/1946 DOA: 01/11/2020 PCP: System, Pcp Not In   Brief Narrative:  Christopher Travis is an 74 y.o.  Hispanic male PMHx Parkinson's disease and non-Hodgkin's lymphoma in remission   who developed subjective fever malaise about 1 week prior to admission, started developing shortness of breath about 2 to 3 days ago at home his sats began reading 80% presented to [facility] found to be hypoxic,  Tachypneic chest x-ray showed bilateral infiltrates, SARS-CoV-2 PCR, his creatinine was 1.3 (up from baseline 1.0) elevated bili Ruben with a leukocytosis lactic acid of 4.3 procalcitonin 2.5 D-dimer 1.28 blood cultures ordered was fluid resuscitated started on IV Decadron on remdesivir.   Subjective: 2/22 afebrile overnight Precedex titrated off, patient more alert answering yes and no questions, appears appropriately.    Assessment & Plan:   Principal Problem:   Pneumonia due to COVID-19 virus Active Problems:   Parkinson disease ([place])   Hypertension   AKI (acute kidney injury) ([place])   Severe sepsis ([place])   Hyperbilirubinemia   Acute respiratory failure with hypoxia ([place])   Parkinson's disease ([place])   Palliative care encounter   HCAP (healthcare-associated pneumonia)   Leukocytosis  Covid pneumonia/Acute Respiratory Failure with hypoxia/CAP COVID-19 Labs  Recent Labs    01/25/20 0546 01/26/20 0525 01/27/20 0440  DDIMER >20.00* 9.90* 12.96*  FERRITIN 2,386* 2,343* 642*  CRP 13.1* 9.9* 8.2*    2/12 SARS coronavirus 2 positive  Covid pneumonia/acute respiratory failure with hypoxia/HCAP -2/13 Covid convalescent plasma x2 doses -Decadron 6 mg daily -Remdesivir per pharmacy protocol -2/15 Actemra x1 dose -Titrate O2 to maintain SPO2 85% if possible.  Patient appears to be happy hypoxic doing well low 80s -Prone patient 16 hours/day; if cannot tolerate prone 2 to 3 hours per shift -2/19 Fentanyl 50 mcg q 4 hr PRN -2/20  PCXR; multi lobar pneumonia see results below -2/22 Precedex DCd Precedex started overnight.  Continue 1 more night.  Goal RASS score 0 to -1 -2/21 Levophed started overnight; titrate off as tolerated. MAP goal >60  Severe Sepsis -Unknown source HCAP vs bacteremia vs UTI vs unk source vs secondary to acute DVT -Leukocytosis with negative fever, bands or left shift -2/17 PCXR; worsening groundglass opacification see results below -2/18 patient's respiratory status continues to deteriorate will DC CAP coverage and start HCAP coverage x7 days. -2/22 respiratory status stabilized.  Patient now on [place] only; per RN at night occasionally requires addition of NRB to maintain sats.  Thrush -Fluconazole IV complete 5-day course   Acute LEFT lower extremity DVT/Acute RIGHT lower extremity SVT -Treatment dose heparin drip. -Unable to verify if patient has PE spoke with daughter who confirms has true iodine allergy, therefore no CTA PE protocol.  Chronic diastolic CHF -Strict in and out +7.7 L; patient only produced 162m urine last 24 hours -Daily weight Filed Weights   01/25/20 0500 01/26/20 0500 01/27/20 0500  Weight: 85.2 kg 83.1 kg 80.9 kg  -D5W 522mhr  Essential hypertension: Hypotension -Holding antihypertensive medication due to the sepsis picture. -Patient has had multiple episode of tachycardia/bradycardia.  Sick sinus syndrome?   -Echocardiogram positive for chronic diastolic CHF see results below -2/22 Levophed 2/21>>, patient continues to need Levophed to maintain MAP> 60 especially at night.  Continue to attempt to titrate off, MAP goal> 60  Sick sinus syndrome? -Bradycardia has resolved therefore rules out sick sinus syndrome.  Chronic diastolic CHF see above  Acute kidney injury: Recent Labs  Lab 01/25/20 0546 01/25/20 1615 01/26/20 0525 01/26/20 1620 01/27/20 0440  CREATININE 4.11* 3.97* 2.94* 2.11* 2.02*  -See CHF -2/18 renal ultrasound nondiagnostic see results  below -2/18 Lasix IV 60 mg x 1 -Patient's renal status has continued to worsen patient now uremic.  Spoke with Dr. Rodman Key nephrology about initiating CRRT.  She concurs patient would benefit from CRRT. -2/21 improvement with CRRT.  Continue CRRT per nephrology  Uremic Encephalopathy -Trend BUN RResults for Christopher, Travis (MRN [id number]) as of 01/27/2020 08:19  Ref. Range 01/25/2020 05:46 01/25/2020 16:15 01/26/2020 05:25 01/26/2020 16:20 01/27/2020 04:40  BUN Latest Ref Range: 8 - 23 mg/dL 185 (H) 181 (H) 62 (H) 65 (H) 83 (H)  -Alert appears to be appropriately answering questions with nod of his head.    Hyperbilirubinemia/Liver failure -Patient's bilirubin continues to climb Results for Christopher, Travis (MRN [id number]) as of 01/27/2020 08:19  Ref. Range 01/23/2020 00:20 01/24/2020 02:04 01/25/2020 05:46 01/26/2020 05:25 01/27/2020 04:40  Total Bilirubin Latest Ref Range: 0.3 - 1.2 mg/dL 1.6 (H) 1.7 (H) 3.3 (H) 3.9 (H) 5.4 (H)  -Trend INR -Avoid hepatotoxic medication  Parkinson's disease -At home on combination of Sinemet IR 50-200 and  Sinemet CR 100-454m.  -Sinemet per pharmacy.  Changes will be made to home dosing secondary to Sinemet CR not being able to be crushed and placed down tube  Hypernatremia -Resolved  Hyperkalemia -2/22 discussed with Dr. RRoney JaffeNephrology.  Will change bath and CRRT to decrease potassium -2/22 calcium gluconate 1 g  -Repeat BMP/Mg pending  Thrombocytopenia Results for Christopher, Travis(MRN [id number] as of 01/27/2020 08:19  Ref. Range 01/23/2020 00:20 01/24/2020 02:04 01/25/2020 05:46 01/26/2020 05:25 01/27/2020 04:40  Platelets Latest Ref Range: 150 - 400 K/uL 125 (L) 110 (L) 135 (L) 147 (L) 191  -Resolved  Leukocytosis -2/21 most likely leukemoid Reaction/Demargination.   -2/22 positive mild left shift.  Negative fever, patient has been hypothermic.  Cultures to date NGTD see results below -2/22 phone consult Dr. CCarlyle Basques ID discussed case concurs  that most likely leukemoid reaction. Results for Christopher, Travis(MRN [id number] as of 01/27/2020 08:19  Ref. Range 01/20/2020 01:34 01/21/2020 05:44 01/22/2020 02:49 01/23/2020 00:20 01/24/2020 02:04 01/25/2020 05:46 01/26/2020 05:25 01/27/2020 04:40  WBC Latest Ref Range: 4.0 - 10.5 K/uL 21.4 (H) 34.3 (H) 29.6 (H) 31.6 (H) 32.8 (H) 38.2 (H) 46.9 (H) 54.6 (HH)   Dysphagia -2/22 consulted dietitian; all tube feed formulas, with morning concerning iodine allergy.  Since patient has ANAPHYLACTIC reaction to iodine discussed case with pharmacy.  Pharmacy contacted [employer] regarding Vital HP and they confirmed that it does include<1% marine oil and potassium iodide..Marland KitchenMarland Kitchenlthough it might be a very small amount in the formula that could cause a potential reaction, they couldn't advise using it.  Consulted dietitian on recommendation for feeds for patient.    Goals of care/ethics: I had a long discussion with the family the patient on 01/19/2020, expressed that he did not want to be intubated or resuscitated this was expressed to the family.  I have expressed to the family my concerns about his extremely poor prognosis they would like to talk it over and give it a few more hours to see if he turns around to have explained to him that it is unlikely but we would do as they wish.  We will call him again this afternoon. -2/17 consult palliative care; patient with extremely poor prognosis family does not want to escalate care.  Comfort care vs home hospice vs  residential hospice  -2/20 again spoke with family who continues to push for reversal of DNR.  Again informed family that if they could convince patient to reverse DNR then we will honor that request.  Unfortunately currently patient uremic A/O x1, unable to have an in-depth conversation with family concerning reversal of his previous DNR request.  [place] (daughter) is adamant that father did not understand when he made himself DNR because his native language is Spanish,  however Dr. Tammi Klippel physician who explained CODE STATUS to patient native language is Spanish.   2/21 patient now on CRRT x24 hours patient with extremely poor prognosis for surviving hospitalization    DVT prophylaxis: Lovenox treatment dose---> heparin drip Code Status: DNR Family Communication: 2/22 attempted to call daughter [name] (daughter) no answer on her phone.   Disposition Plan: TBD   Consultants:  Dr. Rodman Key nephrology  PCCM Dr. [place] Bells phone consult Dr. Carlyle Basques, ID   Procedures/Significant Events:  2/13 Covid convalescent plasma x2 doses 2/15 Actemra x1 dose 2/16 bilateral lower extremity Doppler;RIGHT: Positive acute SVT involving the  right great saphenous vein, and right varicosities or other superficial veins.  LEFT: Positive acute DVT involving the left gastrocnemius veins.  2/17 PCXR; low lung volume with worsening of LEFT>> RIGHT interstitial and ground-glass opacities concerning for bilateral pneumonia 2/18 echocardiogram;.Left Ventricle: EF= 60 -65%. The left ventricle has normal function. The left ventricle has no regional wall motion abnormalities.  -Grade I diastolic dysfunction (impaired relaxation).  -There is mildly elevated pulmonary artery systolic pressure.    2/19 renal US; No acute or focal abnormality identified. No hydronephrosis. 4 cm simple cyst left kidney 2/20; Precedex started overnight 2/21 PCXR;-multilobar bilateral pneumonia with similar aeration to the prior Study -Left internal jugular Vas-Cath with tip terminating at the superior cavoatrial junction. 2/21 NG tube placed Levophed 2/21>> Precedex to 2/21>> 2/22  I have personally reviewed and interpreted all radiology studies and my findings are as above.  VENTILATOR SETTINGS: HHFNC 2/22 FiO2 100% Flow; 40 L/min SPO2; 93%   Cultures 2/12 blood 1/2 positive coag negative staph most likely contaminant 2/12 SARS coronavirus 2 positive 2/17 sputum pending 2/17 Blood  RIGHT AC negative final 2/17 blood LEFT AC negative final 2/18 MRSA by PCR negative 2/19 urine negative 2/20 acute hepatitis panel pending 2/20 HIV panel negative    Antimicrobials: Anti-infectives (From admission, onward)   Start     Dose/Rate Stop   01/25/20 2200  ceFEPIme (MAXIPIME) 2 g in sodium chloride 0.9 % 100 mL IVPB     2 g 200 mL/hr over 30 Minutes 02/06/2020 2359   01/24/20 1000  fluconazole (DIFLUCAN) IVPB 100 mg     100 mg 50 mL/hr over 60 Minutes     01/23/20 1630  fluconazole (DIFLUCAN) IVPB 200 mg     200 mg 100 mL/hr over 60 Minutes 01/23/20 1924   01/23/20 0800  ceFEPIme (MAXIPIME) 2 g in sodium chloride 0.9 % 100 mL IVPB  Status:  Discontinued     2 g 200 mL/hr over 30 Minutes 01/25/20 1433   01/18/20 1000  remdesivir 100 mg in sodium chloride 0.9 % 100 mL IVPB     100 mg 200 mL/hr over 30 Minutes 01/21/20 1106   01/18/20 0400  azithromycin (ZITHROMAX) 500 mg in sodium chloride 0.9 % 250 mL IVPB     500 mg 250 mL/hr over 60 Minutes 01/22/20 0422   01/18/20 0300  cefTRIAXone (ROCEPHIN) 2 g in sodium chloride 0.9 % 100  mL IVPB     2 g 200 mL/hr over 30 Minutes 01/22/20 0317   01/18/2020 2045  remdesivir 100 mg in sodium chloride 0.9 % 100 mL IVPB     100 mg 200 mL/hr over 30 Minutes 01/13/2020 2232       Devices NG tube placed 2/21>>   LINES  LEFT IJ Vas-Cath 2/20>>    Continuous Infusions: .  prismasol BGK 4/2.5 500 mL/hr at 01/27/20 1222  .  prismasol BGK 4/2.5 200 mL/hr at 01/25/20 1331  . sodium chloride    . ceFEPime (MAXIPIME) IV 2 g (01/27/20 0810)  . dexmedetomidine (PRECEDEX) IV infusion Stopped (01/27/20 0523)  . dextrose Stopped (01/24/20 1939)  . fluconazole (DIFLUCAN) IV Stopped (01/27/20 1043)  . heparin 1,100 Units/hr (01/27/20 1400)  . norepinephrine (LEVOPHED) Adult infusion Stopped (01/27/20 1027)  . prismasol BGK 4/2.5 1,000 mL/hr at 01/27/20 0927     Objective: Vitals:   01/27/20 1300 01/27/20 1315 01/27/20 1330 01/27/20  1345  BP: (!) 100/58 (!) 95/38 (!) 96/42 (!) 106/37  Pulse: 73 70 69 71  Resp: (!) 26 (!) 30 (!) 26 (!) 30  Temp: 97.9 F (36.6 C) 97.9 F (36.6 C) 97.9 F (36.6 C) 97.9 F (36.6 C)  TempSrc:      SpO2: 93% 91% 94% (!) 88%  Weight:      Height:        Intake/Output Summary (Last 24 hours) at 01/27/2020 1411 Last data filed at 01/27/2020 1400 Gross per 24 hour  Intake 1701.27 ml  Output 1530 ml  Net 171.27 ml   Filed Weights   01/25/20 0500 01/26/20 0500 01/27/20 0500  Weight: 85.2 kg 83.1 kg 80.9 kg    Physical Exam:  General: Alert nods yes and no to questions, positive acute respiratory distress Eyes: negative scleral hemorrhage, negative anisocoria, negative icterus ENT: Negative Runny nose, negative gingival bleeding, Neck:  Negative scars, masses, torticollis, lymphadenopathy, JVD, LEFT IJ Vas-Cath properly clean negative sign of infection Lungs: Tachypneic clear to auscultation bilaterally without wheezes or crackles Cardiovascular: Tachycardic without murmur gallop or rub normal S1 and S2 Abdomen: negative abdominal pain, nondistended, positive soft, bowel sounds, no rebound, no ascites, no appreciable mass Extremities: No significant cyanosis, clubbing, or edema bilateral lower extremities Skin: Negative rashes, lesions, ulcers Psychiatric: Unable to assess fully patient appears to still have some confusion, nonverbal. Central nervous system:  Cranial nerves II through XII intact, tongue/uvula midline, all extremities muscle strength 5/5, sensation intact throughout, negative expressive aphasia, negative receptive aphasia.      Data Reviewed: Care during the described time interval was provided by me .  I have reviewed this patient's available data, including medical history, events of note, physical examination, and all test results as part of my evaluation.   CBC: Recent Labs  Lab 01/23/20 0020 01/24/20 0204 01/25/20 0546 01/26/20 0525 01/27/20 0440  WBC  31.6* 32.8* 38.2* 46.9* 54.6*  NEUTROABS 28.8* 31.3* 36.3* 43.5* 51.0*  HGB 16.5 15.3 16.4 14.9 15.6  HCT 48.0 45.0 49.5 44.2 46.0  MCV 93.2 93.8 94.8 94.4 93.7  PLT 125* 110* 135* 147* 952   Basic Metabolic Panel: Recent Labs  Lab 01/23/20 1658 01/23/20 1658 01/24/20 0204 01/24/20 0204 01/25/20 0546 01/25/20 1615 01/26/20 0525 01/26/20 1620 01/27/20 0440  NA 152*   < > 151*   < > 152* 153* 151* 145 142  K 4.1   < > 4.3   < > 4.8 5.2* 4.9 5.2* 5.0  CL 111   < >  111   < > 112* 116* 112* 110 104  CO2 21*   < > 22   < > 18* 18* 22 23 18*  GLUCOSE 195*   < > 196*   < > 222* 219* 118* 137* 156*  BUN 150*   < > 147*   < > 185* 181* 62* 65* 83*  CREATININE 3.44*   < > 3.44*   < > 4.11* 3.97* 2.94* 2.11* 2.02*  CALCIUM 8.1*   < > 8.0*   < > 7.8* 7.7* 7.7* 7.4* 8.2*  MG 2.7*  --  2.6*  --  3.3*  --  3.0*  --  2.8*  PHOS  --   --  6.7*   < > 6.3* 5.5* 4.1 4.1 4.7*   < > = values in this interval not displayed.   GFR: Estimated Creatinine Clearance: 33.7 mL/min (A) (by C-G formula based on SCr of 2.02 mg/dL (H)). Liver Function Tests: Recent Labs  Lab 01/23/20 0020 01/23/20 0020 01/24/20 0204 01/24/20 0204 01/25/20 0546 01/25/20 1615 01/26/20 0525 01/26/20 1620 01/27/20 0440  AST 58*  --  39  --  92*  --  100*  --  154*  ALT 7  --  9  --  15  --  14  --  20  ALKPHOS 209*  --  202*  --  239*  --  225*  --  293*  BILITOT 1.6*  --  1.7*  --  3.3*  --  3.9*  --  5.4*  PROT 6.1*  --  5.8*  --  6.7  --  6.0*  --  6.5  ALBUMIN 3.0*   < > 2.9*   < > 3.1* 2.9* 2.9* 2.7* 2.9*   < > = values in this interval not displayed.   No results for input(s): LIPASE, AMYLASE in the last 168 hours. No results for input(s): AMMONIA in the last 168 hours. Coagulation Profile: Recent Labs  Lab 01/27/20 0440  INR 1.3*   Cardiac Enzymes: No results for input(s): CKTOTAL, CKMB, CKMBINDEX, TROPONINI in the last 168 hours. BNP (last 3 results) No results for input(s): PROBNP in the last 8760  hours. HbA1C: No results for input(s): HGBA1C in the last 72 hours. CBG: Recent Labs  Lab 01/26/20 1206 01/26/20 1630 01/26/20 2315 01/27/20 0757 01/27/20 1227  GLUCAP 131* 128* 114* 160* 200*   Lipid Profile: No results for input(s): CHOL, HDL, LDLCALC, TRIG, CHOLHDL, LDLDIRECT in the last 72 hours. Thyroid Function Tests: No results for input(s): TSH, T4TOTAL, FREET4, T3FREE, THYROIDAB in the last 72 hours. Anemia Panel: Recent Labs    01/26/20 0525 01/27/20 0440  FERRITIN 2,343* 642*   Urine analysis: No results found for: COLORURINE, APPEARANCEUR, LABSPEC, PHURINE, GLUCOSEU, HGBUR, BILIRUBINUR, KETONESUR, PROTEINUR, UROBILINOGEN, NITRITE, LEUKOCYTESUR Sepsis Labs: @LABRCNTIP (procalcitonin:4,lacticidven:4)  ) Recent Results (from the past 240 hour(s))  Blood culture (routine x 2)     Status: Abnormal   Collection Time: 01/14/2020  7:25 PM   Specimen: BLOOD  Result Value Ref Range Status   Specimen Description   Final    BLOOD BLOOD LEFT FOREARM Performed at [facility], [address] [place], [place] [address]    Special Requests   Final    BOTTLES DRAWN AEROBIC AND ANAEROBIC Blood Culture adequate volume Performed at [facility], [address] [place], [place] [address]    Culture  Setup Time   Final    GRAM POSITIVE COCCI  AEROBIC BOTTLE ONLY CRITICAL RESULT CALLED TO, READ BACK BY AND VERIFIED WITH: PHARMD MAY BALL @1501  01/18/20 AKT    Culture (A)  Final    STAPHYLOCOCCUS SPECIES (COAGULASE NEGATIVE) THE SIGNIFICANCE OF ISOLATING THIS ORGANISM FROM A SINGLE SET OF BLOOD CULTURES WHEN MULTIPLE SETS ARE DRAWN IS UNCERTAIN. PLEASE NOTIFY THE MICROBIOLOGY DEPARTMENT WITHIN ONE WEEK IF SPECIATION AND SENSITIVITIES ARE REQUIRED. Performed at [facility], [place] [address], [place], [place] [address]    Report Status 01/19/2020 FINAL  Final  Blood culture (routine x 2)     Status: None   Collection Time: 01/15/2020  7:54 PM   Specimen:  BLOOD  Result Value Ref Range Status   Specimen Description   Final    BLOOD RIGHT ANTECUBITAL Performed at [facility], [address] [place], [place] [address]    Special Requests   Final    BOTTLES DRAWN AEROBIC AND ANAEROBIC Blood Culture adequate volume Performed at [facility], [address] [place], [place] [address]    Culture   Final    NO GROWTH 5 DAYS Performed at [facility], [place] [address], [place], [place] [address]    Report Status 01/22/2020 FINAL  Final  SARS Coronavirus 2 Ag (30 min TAT) - Nasal Swab (BD Veritor Kit)     Status: Abnormal   Collection Time: 01/18/2020  7:59 PM   Specimen: Nasal Swab (BD Veritor Kit)  Result Value Ref Range Status   SARS Coronavirus 2 Ag POSITIVE (A) NEGATIVE Final    Comment: RESULT CALLED TO, READ BACK BY AND VERIFIED WITH: KELLY NEAL RN @2034  01/13/2020 OLSONM (NOTE) SARS-CoV-2 antigen PRESENT. Positive results indicate the presence of viral antigens, but clinical correlation with patient history and other diagnostic information is necessary to determine patient infection status.  Positive results do not rule out bacterial infection or co-infection  with other viruses. False positive results are rare but can occur, and confirmatory RT-PCR testing may be appropriate in some circumstances. The expected result is Negative. Fact Sheet for Patients: PodPark.tn Fact Sheet for Providers: GiftContent.is  This test is not yet approved or cleared by the [place] FDA and  has been authorized for detection and/or diagnosis of SARS-CoV-2 by FDA under an Emergency Use Authorization (EUA).  This EUA will remain in effect (meaning this test can be used) for the duration of  the COVID-19 decla ration under Section 564(b)(1) of the Act, 21 U.S.C. section 360bbb-3(b)(1), unless the authorization is terminated or revoked sooner. Performed at [facility], [address] [place], [place] [address]   MRSA PCR Screening     Status: None   Collection Time: 01/20/20  9:40 AM   Specimen: Nasopharyngeal  Result Value Ref Range Status   MRSA by PCR NEGATIVE NEGATIVE Final    Comment:        The GeneXpert MRSA Assay (FDA approved for NASAL specimens only), is one component of a comprehensive MRSA colonization surveillance program. It is not intended to diagnose MRSA infection nor to guide or monitor treatment for MRSA infections. Performed at [facility], [place] [address], [place], [place] [address]   Culture, blood (routine x 2)     Status: None   Collection Time: 01/22/20  7:54 PM   Specimen: BLOOD  Result Value Ref Range Status   Specimen Description BLOOD RIGHT ANTECUBITAL  Final   Special Requests   Final  BOTTLES DRAWN AEROBIC AND ANAEROBIC Blood Culture adequate volume   Culture   Final    NO GROWTH 5 DAYS Performed at [facility], [place] [address], [place], [place] [address]    Report Status 01/27/2020 FINAL  Final  Culture, blood (routine x 2)     Status: None   Collection Time: 01/22/20  8:04 PM   Specimen: BLOOD  Result Value Ref Range Status   Specimen Description BLOOD LEFT ANTECUBITAL  Final   Special Requests   Final    BOTTLES DRAWN AEROBIC ONLY Blood Culture results may not be optimal due to an inadequate volume of blood received in culture bottles   Culture   Final    NO GROWTH 5 DAYS Performed at [facility], [place] [address], [place], [place] [address]    Report Status 01/27/2020 FINAL  Final  MRSA PCR Screening     Status: None   Collection Time: 01/23/20  9:20 AM   Specimen: Nasal Mucosa; Nasopharyngeal  Result Value Ref Range Status   MRSA by PCR NEGATIVE NEGATIVE Final    Comment:        The GeneXpert MRSA Assay (FDA approved for NASAL specimens only), is one component of a comprehensive MRSA colonization surveillance program. It is not intended  to diagnose MRSA infection nor to guide or monitor treatment for MRSA infections. Performed at [facility], [place] [address] [place], [place] [address]   Culture, Urine     Status: None   Collection Time: 01/24/20  4:43 PM   Specimen: Urine, Clean Catch  Result Value Ref Range Status   Specimen Description   Final    URINE, CLEAN CATCH Performed at [facility], [place] [address], [place], [place] [address]    Special Requests   Final    NONE Performed at [facility], [place] [address] [place], [place] [address]    Culture   Final    NO GROWTH Performed at [facility], [place] [address], [place], [place] [address]    Report Status 01/25/2020 FINAL  Final         Radiology Studies: No results found.      Scheduled Meds: . carbidopa-levodopa  3 tablet Per Tube 3 times per day   And  . carbidopa-levodopa  2 tablet Per Tube QHS  . chlorhexidine  15 mL Mouth Rinse BID  . Chlorhexidine Gluconate Cloth  6 each Topical Daily  . dexamethasone (DECADRON) injection  6 mg Intravenous Q24H  . feeding supplement (ENSURE ENLIVE)  237 mL Oral TID BM  . insulin aspart  0-9 Units Subcutaneous TID WC  . mouth rinse  15 mL Mouth Rinse 10 times per day  . metoprolol tartrate  50 mg Oral BID  . rOPINIRole  3 mg Oral Q24H  . sodium chloride flush  3 mL Intravenous Q12H  . sodium chloride flush  3 mL Intravenous Q12H   Continuous Infusions: .  prismasol BGK 4/2.5 500 mL/hr at 01/27/20 1222  .  prismasol BGK 4/2.5 200 mL/hr at 01/25/20 1331  . sodium chloride    . ceFEPime (MAXIPIME) IV 2 g (01/27/20 0810)  . dexmedetomidine (PRECEDEX) IV infusion Stopped (01/27/20 0523)  . dextrose Stopped (01/24/20 1939)  . fluconazole (DIFLUCAN) IV Stopped (01/27/20 1043)  . heparin 1,100 Units/hr (01/27/20 1400)  . norepinephrine (LEVOPHED) Adult infusion Stopped (01/27/20 1027)  . prismasol BGK 4/2.5 1,000 mL/hr at 01/27/20 [phone]  LOS: 9 days   The patient is critically ill with multiple organ systems failure and requires high complexity decision making for assessment and support, frequent evaluation and titration of therapies, application of advanced monitoring technologies and extensive interpretation of multiple databases. Critical Care Time devoted to patient care services described in this note  Time spent: 40 minutes     Elsy Chiang, Geraldo Docker, MD [facility] Pager [phone]  If 7PM-7AM, please contact night-coverage [email or web address] Password [facility] 01/27/2020, 2:11 PM

## 2020-01-27 NOTE — Progress Notes (Signed)
Per daughter, [name], family requests to video zoom with patient. When asked for pt's permission, he shook his head to video zoom and/or listen to family speak on phone to him. Video interpretor, Dorys, was used to confirm pt's response (nods/gestures) while family listened in. Pt expressed he is not ready. Family confirms and respects pt's wish.

## 2020-01-27 NOTE — Progress Notes (Signed)
Physical Therapy Treatment Patient Details Name: Christopher Travis MRN: [id number] DOB: [birth year] Today's Date: 01/27/2020    History of Present Illness  74 y.o. male with medical history significant for Parkinson disease and non-Hodgkin lymphoma in remission, who developed subjective fevers and malaise roughly a week ago after a family member with asymptomatic COVID visited him. He went on to develop SOB 2-3 days ago, was monitoring oxygen saturations at home, began to see readings in 80's, and [facility] for that reasonFormally dx with acute respiratory failure with hypoxia secondary to COIVD 19 PNA and possibly CAP, sepsis, AKI, hyperbilirubinema.      PT Comments    Patient currently in ICU and was on CRTT during PT/OT co-treat. Monitor closely all vitals. Used translator as provided in unit ([employer]). He tends to not say anything audibly- "mouthes words, or nods yes and not". Complains of severe pain in his throat. He is a mouth breather. Was able to perform LE ex, 2 sets of ex, with extensive rest and OT in between- cues to follow, and was AA for all directives. Max of 2 to realign in bed and place in chair position. Should continue to benefit from skilled PT while hospitalized to address goals for optimal functional outcomes.   Follow Up Recommendations  CIR;SNF(Depending on ability to wean from O2 and medically stabilize)     Equipment Recommendations  None recommended by PT    Recommendations for Other Services       Precautions / Restrictions Precautions Precautions: Fall;Other (comment) Precaution Comments: CRRT - jugular access. NG tube, Monitor SpO2, Parkinson's with tremors.  Restrictions Weight Bearing Restrictions: No    Mobility  Bed Mobility Overal bed mobility: Needs Assistance             General bed mobility comments: Total assist x 2 to scoot up in bed.   Transfers                 General transfer comment: Not attempted this date. Pt able to  tolerate chair position ~20 minutes(CRTT today-)  Ambulation/Gait                 Stairs             Wheelchair Mobility    Modified Rankin (Stroke Patients Only)       Balance Overall balance assessment: (Not assessed this date. Pt able to tolerate chair position)                                          Cognition Arousal/Alertness: Awake/alert Behavior During Therapy: Flat affect Overall Cognitive Status: Impaired/Different from baseline Area of Impairment: Orientation;Attention;Memory;Following commands                 Orientation Level: Disoriented to;Place;Time;Situation Current Attention Level: Divided   Following Commands: Follows one step commands inconsistently;Follows one step commands with increased time       General Comments: Utilized interpreter ID [phone] for session. Per RN, pt has not spoken the last few days. Pt able to nod his head yes and no to questions. Pt following 50% of simple one step commands.       Exercises General Exercises - Lower Extremity Ankle Circles/Pumps: AAROM;Supine;10 reps Quad Sets: AAROM;Supine;10 reps Short Arc QuadSinclair Ship;Supine;10 reps Heel Slides: AAROM;Supine;10 reps Straight Leg Raises: AAROM;Supine;10 reps Other Exercises Other Exercises: Second set of LE ex,  AA ROM with cues through translator    General Comments General comments (skin integrity, edema, etc.): Monitor closely for O2 sats. Currently on HFNC at 90% FiO2. 92% at rest, but dropped briefly to 84% during activity, and RR in 40's. Was able to tolerate chair position @  20 minutes during PT /OT co treat      Pertinent Vitals/Pain Pain Assessment: Faces Faces Pain Scale: Hurts even more Pain Location: sore throat Pain Descriptors / Indicators: Aching;Constant;Discomfort Pain Intervention(s): Monitored during session(RN aware)    Home Living                      Prior Function            PT Goals  (current goals can now be found in the care plan section) Acute Rehab PT Goals Patient Stated Goal: Could not define any goals during session. PT Goal Formulation: With patient Time For Goal Achievement: 02/10/20 Potential to Achieve Goals: Fair Progress towards PT goals: PT to reassess next treatment    Frequency    Min 3X/week      PT Plan      Co-evaluation PT/OT/SLP Co-Evaluation/Treatment: Yes Reason for Co-Treatment: Complexity of the patient's impairments (multi-system involvement) PT goals addressed during session: Strengthening/ROM OT goals addressed during session: ADL's and self-care;Strengthening/ROM      AM-PAC PT "6 Clicks" Mobility   Outcome Measure  Help needed turning from your back to your side while in a flat bed without using bedrails?: A Lot Help needed moving from lying on your back to sitting on the side of a flat bed without using bedrails?: A Lot Help needed moving to and from a bed to a chair (including a wheelchair)?: A Lot Help needed standing up from a chair using your arms (e.g., wheelchair or bedside chair)?: A Lot Help needed to walk in hospital room?: A Lot Help needed climbing 3-5 steps with a railing? : A Lot 6 Click Score: 12    End of Session Equipment Utilized During Treatment: Oxygen Activity Tolerance: Treatment limited secondary to medical complications (Comment);Patient limited by fatigue;Patient limited by lethargy     PT Visit Diagnosis: Unsteadiness on feet (R26.81);Other abnormalities of gait and mobility (R26.89);Muscle weakness (generalized) (M62.81)     Time: [id number] PT Time Calculation (min) (ACUTE ONLY): 32 min  Charges:  $Therapeutic Exercise: 8-22 mins $Therapeutic Activity: 8-22 mins          Rollen Sox, PT # [phone] CGV cell            Casandra Doffing 01/27/2020, 4:41 PM

## 2020-01-28 ENCOUNTER — Inpatient Hospital Stay (HOSPITAL_COMMUNITY): Payer: Medicare Other

## 2020-01-28 DIAGNOSIS — B370 Candidal stomatitis: Secondary | ICD-10-CM

## 2020-01-28 DIAGNOSIS — R1310 Dysphagia, unspecified: Secondary | ICD-10-CM

## 2020-01-28 DIAGNOSIS — K922 Gastrointestinal hemorrhage, unspecified: Secondary | ICD-10-CM | POA: Diagnosis not present

## 2020-01-28 DIAGNOSIS — N179 Acute kidney failure, unspecified: Secondary | ICD-10-CM | POA: Diagnosis not present

## 2020-01-28 DIAGNOSIS — K7290 Hepatic failure, unspecified without coma: Secondary | ICD-10-CM | POA: Diagnosis present

## 2020-01-28 DIAGNOSIS — N171 Acute kidney failure with acute cortical necrosis: Secondary | ICD-10-CM

## 2020-01-28 DIAGNOSIS — B3781 Candidal esophagitis: Secondary | ICD-10-CM

## 2020-01-28 LAB — HEPARIN LEVEL (UNFRACTIONATED)
Heparin Unfractionated: 0.74 IU/mL — ABNORMAL HIGH (ref 0.30–0.70)
Heparin Unfractionated: 0.42 IU/mL (ref 0.30–0.70)
Heparin Unfractionated: 0.3 IU/mL (ref 0.30–0.70)

## 2020-01-28 LAB — CBC WITH DIFFERENTIAL/PLATELET
Abs Immature Granulocytes: 2.54 10*3/uL — ABNORMAL HIGH (ref 0.00–0.07)
Basophils Absolute: 0.1 10*3/uL (ref 0.0–0.1)
Basophils Relative: 0 %
Eosinophils Absolute: 0 10*3/uL (ref 0.0–0.5)
Eosinophils Relative: 0 %
HCT: 45.5 % (ref 39.0–52.0)
Hemoglobin: 15 g/dL (ref 13.0–17.0)
Immature Granulocytes: 5 %
Lymphocytes Relative: 1 %
Lymphs Abs: 0.4 10*3/uL — ABNORMAL LOW (ref 0.7–4.0)
MCH: 31.8 pg (ref 26.0–34.0)
MCHC: 33 g/dL (ref 30.0–36.0)
MCV: 96.4 fL (ref 80.0–100.0)
Monocytes Absolute: 0.8 10*3/uL (ref 0.1–1.0)
Monocytes Relative: 1 %
Neutro Abs: 52.8 10*3/uL — ABNORMAL HIGH (ref 1.7–7.7)
Neutrophils Relative %: 93 %
Platelets: 140 10*3/uL — ABNORMAL LOW (ref 150–400)
RBC: 4.72 MIL/uL (ref 4.22–5.81)
RDW: 16.6 % — ABNORMAL HIGH (ref 11.5–15.5)
WBC: 56.6 10*3/uL (ref 4.0–10.5)
nRBC: 2.8 % — ABNORMAL HIGH (ref 0.0–0.2)

## 2020-01-28 LAB — COMPREHENSIVE METABOLIC PANEL
ALT: 29 U/L (ref 0–44)
AST: 606 U/L — ABNORMAL HIGH (ref 15–41)
Albumin: 2.7 g/dL — ABNORMAL LOW (ref 3.5–5.0)
Alkaline Phosphatase: 317 U/L — ABNORMAL HIGH (ref 38–126)
Anion gap: 17 — ABNORMAL HIGH (ref 5–15)
BUN: 59 mg/dL — ABNORMAL HIGH (ref 8–23)
CO2: 21 mmol/L — ABNORMAL LOW (ref 22–32)
Calcium: 8.1 mg/dL — ABNORMAL LOW (ref 8.9–10.3)
Chloride: 100 mmol/L (ref 98–111)
Creatinine, Ser: 1.74 mg/dL — ABNORMAL HIGH (ref 0.61–1.24)
GFR calc Af Amer: 43 mL/min — ABNORMAL LOW (ref >60)
GFR calc non Af Amer: 38 mL/min — ABNORMAL LOW (ref >60)
Glucose, Bld: 180 mg/dL — ABNORMAL HIGH (ref 70–99)
Potassium: 4.5 mmol/L (ref 3.5–5.1)
Sodium: 138 mmol/L (ref 135–145)
Total Bilirubin: 5.7 mg/dL — ABNORMAL HIGH (ref 0.3–1.2)
Total Protein: 5.8 g/dL — ABNORMAL LOW (ref 6.5–8.1)

## 2020-01-28 LAB — GLUCOSE, CAPILLARY
Glucose-Capillary: 145 mg/dL — ABNORMAL HIGH (ref 70–99)
Glucose-Capillary: 182 mg/dL — ABNORMAL HIGH (ref 70–99)
Glucose-Capillary: 161 mg/dL — ABNORMAL HIGH (ref 70–99)
Glucose-Capillary: 133 mg/dL — ABNORMAL HIGH (ref 70–99)
Glucose-Capillary: 137 mg/dL — ABNORMAL HIGH (ref 70–99)
Glucose-Capillary: 122 mg/dL — ABNORMAL HIGH (ref 70–99)

## 2020-01-28 LAB — D-DIMER, QUANTITATIVE: D-Dimer, Quant: 9.93 ug/mL-FEU — ABNORMAL HIGH (ref 0.00–0.50)

## 2020-01-28 LAB — RENAL FUNCTION PANEL
Albumin: 2.8 g/dL — ABNORMAL LOW (ref 3.5–5.0)
Albumin: 2.5 g/dL — ABNORMAL LOW (ref 3.5–5.0)
Anion gap: 17 — ABNORMAL HIGH (ref 5–15)
Anion gap: 20 — ABNORMAL HIGH (ref 5–15)
BUN: 58 mg/dL — ABNORMAL HIGH (ref 8–23)
BUN: 46 mg/dL — ABNORMAL HIGH (ref 8–23)
CO2: 20 mmol/L — ABNORMAL LOW (ref 22–32)
CO2: 18 mmol/L — ABNORMAL LOW (ref 22–32)
Calcium: 8.2 mg/dL — ABNORMAL LOW (ref 8.9–10.3)
Calcium: 7.5 mg/dL — ABNORMAL LOW (ref 8.9–10.3)
Chloride: 99 mmol/L (ref 98–111)
Chloride: 99 mmol/L (ref 98–111)
Creatinine, Ser: 1.72 mg/dL — ABNORMAL HIGH (ref 0.61–1.24)
Creatinine, Ser: 1.68 mg/dL — ABNORMAL HIGH (ref 0.61–1.24)
GFR calc Af Amer: 44 mL/min — ABNORMAL LOW (ref >60)
GFR calc Af Amer: 45 mL/min — ABNORMAL LOW (ref >60)
GFR calc non Af Amer: 38 mL/min — ABNORMAL LOW (ref >60)
GFR calc non Af Amer: 39 mL/min — ABNORMAL LOW (ref >60)
Glucose, Bld: 178 mg/dL — ABNORMAL HIGH (ref 70–99)
Glucose, Bld: 141 mg/dL — ABNORMAL HIGH (ref 70–99)
Phosphorus: 4.6 mg/dL (ref 2.5–4.6)
Phosphorus: 5.3 mg/dL — ABNORMAL HIGH (ref 2.5–4.6)
Potassium: 4.2 mmol/L (ref 3.5–5.1)
Potassium: 4.6 mmol/L (ref 3.5–5.1)
Sodium: 136 mmol/L (ref 135–145)
Sodium: 137 mmol/L (ref 135–145)

## 2020-01-28 LAB — PROTIME-INR
INR: 1.5 — ABNORMAL HIGH (ref 0.8–1.2)
Prothrombin Time: 18.4 seconds — ABNORMAL HIGH (ref 11.4–15.2)

## 2020-01-28 LAB — C-REACTIVE PROTEIN: CRP: 5.4 mg/dL — ABNORMAL HIGH (ref <1.0)

## 2020-01-28 LAB — HEMOGLOBIN AND HEMATOCRIT, BLOOD
HCT: 42.8 % (ref 39.0–52.0)
Hemoglobin: 14.2 g/dL (ref 13.0–17.0)

## 2020-01-28 LAB — PHOSPHORUS: Phosphorus: 4.5 mg/dL (ref 2.5–4.6)

## 2020-01-28 LAB — MAGNESIUM: Magnesium: 2.8 mg/dL — ABNORMAL HIGH (ref 1.7–2.4)

## 2020-01-28 LAB — FERRITIN: Ferritin: >7500 ng/mL — ABNORMAL HIGH (ref 24–336)

## 2020-01-28 MED ORDER — PANTOPRAZOLE SODIUM 40 MG PO PACK
40.0000 mg | PACK | Freq: Two times a day (BID) | ORAL | Status: DC
  Filled 2020-01-28: qty 20

## 2020-01-28 MED ORDER — PIVOT 1.5 CAL PO LIQD
1000.0000 mL | ORAL | Status: DC
  Administered 2020-01-29 (×2): 1000 mL

## 2020-01-28 MED ORDER — PANTOPRAZOLE SODIUM 40 MG IV SOLR
40.0000 mg | Freq: Two times a day (BID) | INTRAVENOUS | Status: DC
  Administered 2020-01-28 – 2020-01-29 (×3): 40 mg via INTRAVENOUS
  Filled 2020-01-28 (×4): qty 40

## 2020-01-28 MED ORDER — PRISMASOL BGK 0/2.5 32-2.5 MEQ/L IV SOLN
INTRAVENOUS | Status: DC
  Filled 2020-01-28 (×16): qty 5000

## 2020-01-28 MED ORDER — SODIUM CHLORIDE 0.9% FLUSH: 10.0000 mL | INTRAVENOUS | Status: DC | PRN

## 2020-01-28 MED ORDER — SODIUM CHLORIDE 0.9% FLUSH
10.0000 mL | Freq: Two times a day (BID) | INTRAVENOUS | Status: DC
  Administered 2020-01-28 – 2020-01-29 (×2): 20 mL

## 2020-01-28 NOTE — Progress Notes (Addendum)
PROGRESS NOTE    Christopher Travis  [id number] DOB: 12-19-1945 DOA: 01/20/2020 PCP: System, Pcp Not In   Brief Narrative:  Christopher Travis is an 74 y.o.  Hispanic male PMHx Parkinson's disease and non-Hodgkin's lymphoma in remission   who developed subjective fever malaise about 1 week prior to admission, started developing shortness of breath about 2 to 3 days ago at home his sats began reading 80% presented to [facility] found to be hypoxic,  Tachypneic chest x-ray showed bilateral infiltrates, SARS-CoV-2 PCR, his creatinine was 1.3 (up from baseline 1.0) elevated bili Ruben with a leukocytosis lactic acid of 4.3 procalcitonin 2.5 D-dimer 1.28 blood cultures ordered was fluid resuscitated started on IV Decadron on remdesivir.   Subjective: 2/23 afebrile overnight afebrile overnight, Levophed restarted.  NG tube pulled by RN who felt that NG tube coiled in the back of patient's throat.  Awake, alert, follows commands   Assessment & Plan:   Principal Problem:   Pneumonia due to COVID-19 virus Active Problems:   Parkinson disease ([place])   Hypertension   AKI (acute kidney injury) ([place])   Severe sepsis ([place])   Hyperbilirubinemia   Acute respiratory failure with hypoxia ([place])   Parkinson's disease ([place])   Palliative care encounter   HCAP (healthcare-associated pneumonia)   Leukocytosis  Covid pneumonia/Acute Respiratory Failure with hypoxia/CAP COVID-19 Labs  Recent Labs    01/26/20 0525 01/27/20 0440 01/28/20 0400  DDIMER 9.90* 12.96* 9.93*  FERRITIN 2,343* 642* >7,500*  CRP 9.9* 8.2* 5.4*    2/12 SARS coronavirus 2 positive  Covid pneumonia/acute respiratory failure with hypoxia/HCAP -2/13 Covid convalescent plasma x2 doses -Decadron 6 mg daily -Remdesivir per pharmacy protocol -2/15 Actemra x1 dose -Titrate O2 to maintain SPO2 85% if possible.  Patient appears to be happy hypoxic doing well low 80s -Prone patient 16 hours/day; if cannot tolerate prone 2 to 3 hours  per shift -2/19 Fentanyl 50 mcg q 4 hr PRN -2/20 PCXR; multi lobar pneumonia see results below -2/22 Precedex DCd Precedex started overnight.  Continue 1 more night.  Goal RASS score 0 to -1  Severe Sepsis -Unknown source HCAP vs bacteremia vs UTI vs unk source vs secondary to acute DVT -Leukocytosis with negative fever, bands or left shift -2/17 PCXR; worsening groundglass opacification see results below -2/18 patient's respiratory status continues to deteriorate will DC CAP coverage and start HCAP coverage x7 days. -2/22 respiratory status stabilized.  Patient now on [place] only; per RN at night occasionally requires addition of NRB to maintain sats.  Thrush -Fluconazole IV complete 5-day course   Acute LEFT lower extremity DVT/Acute RIGHT lower extremity SVT -Treatment dose heparin drip. -Unable to verify if patient has PE spoke with daughter who confirms has true iodine allergy, therefore no CTA PE protocol.  Chronic diastolic CHF -Strict in and out +7.7 L; patient produced 37ml urine last 24 hours -Daily weight Filed Weights   01/26/20 0500 01/27/20 0500 01/28/20 0500  Weight: 83.1 kg 80.9 kg 82 kg  -D5W 57ml/hr  Essential hypertension: Hypotension -Holding antihypertensive medication due to the sepsis picture. -Patient has had multiple episode of tachycardia/bradycardia.  Sick sinus syndrome?   -Echocardiogram positive for chronic diastolic CHF see results below -2/23 Levophed restarted overnight.  MAP goal> 60, will titrate down as tolerated  Sick sinus syndrome? -Bradycardia has resolved therefore rules out sick sinus syndrome.  Chronic diastolic CHF see above  Acute kidney injury:/Anuric  Recent Labs  Lab 01/26/20 1620 01/27/20 0440 01/27/20 1535 01/27/20 1830  01/28/20 0400  CREATININE 2.11* 2.02* 2.16* 2.05* 1.74*  1.72*  -See CHF -2/18 renal ultrasound nondiagnostic see results below -2/18 Lasix IV 60 mg x 1 -Patient's renal status has continued to worsen  patient now uremic.  Spoke with Dr. Rodman Travis nephrology about initiating CRRT.  She concurs patient would benefit from CRRT. -2/23 Anuric.  Continue CRRT per nephrology  Uremic Encephalopathy -Trend BUN Results for Christopher Travis (MRN [id number]) as of 01/28/2020 08:12  Ref. Range 01/25/2020 05:46 01/25/2020 16:15 01/26/2020 05:25 01/26/2020 16:20 01/27/2020 04:40 01/27/2020 15:35 01/27/2020 18:30 01/28/2020 04:00 01/28/2020 04:00  BUN Latest Ref Range: 8 - 23 mg/dL 185 (H) 181 (H) 62 (H) 65 (H) 83 (H) 71 (H) 69 (H) 58 (H) 59 (H)  -Alert appears to be appropriately answering questions with nod of his head.    Hyperbilirubinemia/Liver failure -Patient's bilirubin continues to climb Results for Christopher Travis (MRN [id number]) as of 01/28/2020 08:12  Ref. Range 01/23/2020 00:20 01/24/2020 02:04 01/25/2020 05:46 01/26/2020 05:25 01/27/2020 04:40 01/28/2020 04:00  Total Bilirubin Latest Ref Range: 0.3 - 1.2 mg/dL 1.6 (H) 1.7 (H) 3.3 (H) 3.9 (H) 5.4 (H) 5.7 (H)  -Trend INR Recent Labs  Lab 01/27/20 0440 01/28/20 0400  INR 1.3* 1.5*  -Avoid hepatotoxic medication -2/23 liver failure continues to worsen, despite maximal treatment.  Parkinson's disease -At home on combination of Sinemet IR 50-200 and  Sinemet CR 100-400mg .  -Sinemet per pharmacy.  Changes will be made to home dosing secondary to Sinemet CR not being able to be crushed and placed down tube  Hypernatremia -Resolved  Hyperkalemia -2/22 discussed with Dr. Roney Travis Nephrology.  Will change bath and CRRT to decrease potassium -2/22 calcium gluconate 1 g  -2/23 resolved  Thrombocytopenia Results for Christopher Travis (MRN [id number]) as of 01/28/2020 08:12  Ref. Range 01/23/2020 00:20 01/24/2020 02:04 01/25/2020 05:46 01/26/2020 05:25 01/27/2020 04:40 01/28/2020 04:00  Platelets Latest Ref Range: 150 - 400 K/uL 125 (L) 110 (L) 135 (L) 147 (L) 191 140 (L)  -2/23 trended down slightly today monitor closely given patient's upper GI  bleed  Leukocytosis -2/21 most likely Leukemoid Reaction/Demargination.   -2/22 positive mild left shift.  Negative fever, patient has been hypothermic.  Cultures to date NGTD see results below -2/22 phone consult Dr. Carlyle Travis, ID discussed case concurs that most likely leukemoid reaction. Results for JOHNMICHAEL, HELMAN (MRN [id number]) as of 01/28/2020 08:12  Ref. Range 01/20/2020 01:34 01/21/2020 05:44 01/22/2020 02:49 01/23/2020 00:20 01/24/2020 02:04 01/25/2020 05:46 01/26/2020 05:25 01/27/2020 04:40 01/28/2020 04:00  WBC Latest Ref Range: 4.0 - 10.5 K/uL 21.4 (H) 34.3 (H) 29.6 (H) 31.6 (H) 32.8 (H) 38.2 (H) 46.9 (H) 54.6 (HH) 56.6 (HH)    Upper GI bleed -2/22 1 episode of tarry stool reported.  Fecal occult positive Recent Labs  Lab 01/24/20 0204 01/25/20 0546 01/26/20 0525 01/27/20 0440 01/28/20 0400  HGB 15.3 16.4 14.9 15.6 15.0  -Protonix IV 40 mg BID -Hg stable -Decrease heparin to minimal therapeutic level -H/H TID  Dysphagia -2/22 consulted dietitian; all tube feed formulas, with morning concerning iodine allergy.  Since patient has ANAPHYLACTIC reaction to iodine discussed case with pharmacy.  Pharmacy contacted [employer] regarding Vital HP and they confirmed that it does include<1% marine oil and potassium iodide.Marland KitchenMarland KitchenAlthough it might be a very small amount in the formula that could cause a potential reaction, they couldn't advise using it.  Consulted dietitian on recommendation for feeds for patient. -2/23 NG tube pulled overnight.  Swallow evaluation pending if patient can  swallow medication will hold off and have core track placed in A.m. if patient unable to swallow medication safely will replace NG tube today.    Goals of care/ethics: I had a long discussion with the family the patient on 01/19/2020, expressed that he did not want to be intubated or resuscitated this was expressed to the family.  I have expressed to the family my concerns about his extremely poor prognosis they  would like to talk it over and give it a few more hours to see if he turns around to have explained to him that it is unlikely but we would do as they wish.  We will call him again this afternoon. -2/17 consult palliative care; patient with extremely poor prognosis family does not want to escalate care.  Comfort care vs home hospice vs residential hospice  -2/20 again spoke with family who continues to push for reversal of DNR.  Again informed family that if they could convince patient to reverse DNR then we will honor that request.  Unfortunately currently patient uremic A/O x1, unable to have an in-depth conversation with family concerning reversal of his previous DNR request.  [place] (daughter) is adamant that father did not understand when he made himself DNR because his native language is Spanish, however Dr. Tammi Klippel physician who explained CODE STATUS to patient native language is Spanish.   2/21 patient now on CRRT x24 hours patient with extremely poor prognosis for surviving hospitalization    DVT prophylaxis: Lovenox treatment dose---> heparin drip Code Status: DNR Family Communication: 2/23 spoke [name] (daughter) counseled on plan of care, answered all questions.  Family advised that patient now Anuric and liver failure continues to worsen.  Advised this was a poor prognostic factor for survival. Disposition Plan: TBD   Consultants:  Dr. Rodman Travis nephrology  PCCM Dr. Lake Bells phone consult Dr. Carlyle Travis, ID   Procedures/Significant Events:  2/13 Covid convalescent plasma x2 doses 2/15 Actemra x1 dose 2/16 bilateral lower extremity Doppler;RIGHT: Positive acute SVT involving the  right great saphenous vein, and right varicosities or other superficial veins.  LEFT: Positive acute DVT involving the left gastrocnemius veins.  2/17 PCXR; low lung volume with worsening of LEFT>> RIGHT interstitial and ground-glass opacities concerning for bilateral pneumonia 2/18  echocardiogram;.Left Ventricle: EF= 60 -65%. The left ventricle has normal function. The left ventricle has no regional wall motion abnormalities.  -Grade I diastolic dysfunction (impaired relaxation).  -There is mildly elevated pulmonary artery systolic pressure.    2/19 renal US; No acute or focal abnormality identified. No hydronephrosis. 4 cm simple cyst left kidney 2/20; Precedex started overnight 2/21 PCXR;-multilobar bilateral pneumonia with similar aeration to the prior Study -Left internal jugular Vas-Cath with tip terminating at the superior cavoatrial junction. 2/21 NG tube placed Levophed 2/21>>2/22 Precedex to 2/21>> 2/22 Levophed 2/22>>>   I have personally reviewed and interpreted all radiology studies and my findings are as above.  VENTILATOR SETTINGS: [place] 2/23  FiO2 100% Flow; 40 L/min SPO2; 100%   Cultures 2/12 blood 1/2 positive coag negative staph most likely contaminant 2/12 SARS coronavirus 2 positive 2/17 sputum pending 2/17 Blood RIGHT AC negative final 2/17 blood LEFT AC negative final 2/18 MRSA by PCR negative 2/19 urine negative 2/20 acute hepatitis panel pending 2/20 HIV panel negative    Antimicrobials: Anti-infectives (From admission, onward)   Start     Dose/Rate Stop   01/25/20 2200  ceFEPIme (MAXIPIME) 2 g in sodium chloride 0.9 % 100 mL IVPB  2 g 200 mL/hr over 30 Minutes 2020-01-31 2359   01/24/20 1000  fluconazole (DIFLUCAN) IVPB 100 mg     100 mg 50 mL/hr over 60 Minutes 01/28/20 2359   01/23/20 1630  fluconazole (DIFLUCAN) IVPB 200 mg     200 mg 100 mL/hr over 60 Minutes 01/23/20 1924   01/23/20 0800  ceFEPIme (MAXIPIME) 2 g in sodium chloride 0.9 % 100 mL IVPB  Status:  Discontinued     2 g 200 mL/hr over 30 Minutes 01/25/20 1433   01/18/20 1000  remdesivir 100 mg in sodium chloride 0.9 % 100 mL IVPB     100 mg 200 mL/hr over 30 Minutes 01/21/20 1106   01/18/20 0400  azithromycin (ZITHROMAX) 500 mg in sodium chloride 0.9 %  250 mL IVPB     500 mg 250 mL/hr over 60 Minutes 01/22/20 0422   01/18/20 0300  cefTRIAXone (ROCEPHIN) 2 g in sodium chloride 0.9 % 100 mL IVPB     2 g 200 mL/hr over 30 Minutes 01/22/20 0317   01/08/2020 2045  remdesivir 100 mg in sodium chloride 0.9 % 100 mL IVPB     100 mg 200 mL/hr over 30 Minutes 01/22/2020 2232       Devices NG tube placed 2/21>>   LINES  LEFT IJ Vas-Cath 2/20>>    Continuous Infusions: .  prismasol BGK 4/2.5 500 mL/hr at 01/28/20 [id number]  .  prismasol BGK 4/2.5 200 mL/hr at 01/27/20 2311  . sodium chloride    . ceFEPime (MAXIPIME) IV 2 g (01/27/20 2115)  . dexmedetomidine (PRECEDEX) IV infusion Stopped (01/27/20 0523)  . dextrose Stopped (01/24/20 1939)  . dextrose 50 mL/hr at 01/28/20 0800  . fluconazole (DIFLUCAN) IV Stopped (01/27/20 1043)  . heparin 850 Units/hr (01/28/20 0800)  . norepinephrine (LEVOPHED) Adult infusion 1 mcg/min (01/28/20 0800)  . prismasol BGK 0/2.5 2,000 mL/hr at 01/28/20 0617     Objective: Vitals:   01/28/20 0645 01/28/20 0700 01/28/20 0745 01/28/20 0800  BP: 97/65 108/73 (!) 84/51 (!) 95/56  Pulse: 74 75 67 71  Resp: (!) 21 16 (!) 25 (!) 26  Temp: (!) 96.8 F (36 C) (!) 96.8 F (36 C) (!) 96.6 F (35.9 C) (!) 96.6 F (35.9 C)  TempSrc:      SpO2: 98% 96% 100% (!) 89%  Weight:      Height:        Intake/Output Summary (Last 24 hours) at 01/28/2020 [id number] Last data filed at 01/28/2020 0800 Gross per 24 hour  Intake 1924.32 ml  Output 1092 ml  Net 832.32 ml   Filed Weights   01/26/20 0500 01/27/20 0500 01/28/20 0500  Weight: 83.1 kg 80.9 kg 82 kg    General: Alert, interactive, positive acute respiratory distress Eyes: negative scleral hemorrhage, negative anisocoria, negative icterus ENT: Negative Runny nose, negative gingival bleeding, Neck:  Negative scars, masses, torticollis, lymphadenopathy, JVD Lungs: Tachypneic clear to auscultation bilaterally without wheezes or crackles Cardiovascular: Regular rate  and rhythm without murmur gallop or rub normal S1 and S2 Abdomen: negative abdominal pain, nondistended, positive soft, bowel sounds, no rebound, no ascites, no appreciable mass Extremities: No significant cyanosis, clubbing, positive +1  edema bilateral lower extremities Skin: Negative rashes, lesions, ulcers Psychiatric:  Negative depression, negative anxiety, negative fatigue, negative mania  Central nervous system:  Cranial nerves II through XII intact, tongue/uvula midline, all extremities muscle strength 5/5, sensation intact throughout, negative dysarthria, negative expressive aphasia, negative receptive aphasia.  Data Reviewed: Care during the described time interval was provided by me .  I have reviewed this patient's available data, including medical history, events of note, physical examination, and all test results as part of my evaluation.   CBC: Recent Labs  Lab 01/24/20 0204 01/25/20 0546 01/26/20 0525 01/27/20 0440 01/28/20 0400  WBC 32.8* 38.2* 46.9* 54.6* 56.6*  NEUTROABS 31.3* 36.3* 43.5* 51.0* 52.8*  HGB 15.3 16.4 14.9 15.6 15.0  HCT 45.0 49.5 44.2 46.0 45.5  MCV 93.8 94.8 94.4 93.7 96.4  PLT 110* 135* 147* 191 [id number]*   Basic Metabolic Panel: Recent Labs  Lab 01/25/20 0546 01/25/20 1615 01/26/20 0525 01/26/20 0525 01/26/20 1620 01/27/20 0440 01/27/20 1535 01/27/20 1830 01/28/20 0400  NA 152*   < > 151*   < > 145 142 140 140 138  136  K 4.8   < > 4.9   < > 5.2* 5.0 6.2* 5.8* 4.5  4.2  CL 112*   < > 112*   < > 110 104 105 104 100  99  CO2 18*   < > 22   < > 23 18* 18* 17* 21*  20*  GLUCOSE 222*   < > 118*   < > 137* 156* 215* 179* 180*  178*  BUN 185*   < > 62*   < > 65* 83* 71* 69* 59*  58*  CREATININE 4.11*   < > 2.94*   < > 2.11* 2.02* 2.16* 2.05* 1.74*  1.72*  CALCIUM 7.8*   < > 7.7*   < > 7.4* 8.2* 8.1* 8.0* 8.1*  8.2*  MG 3.3*  --  3.0*  --   --  2.8*  --  3.3* 2.8*  PHOS 6.3*   < > 4.1  --  4.1 4.7* 5.3*  --  4.5  4.6   < > =  values in this interval not displayed.   GFR: Estimated Creatinine Clearance: 39.1 mL/min (A) (by C-G formula based on SCr of 1.74 mg/dL (H)). Liver Function Tests: Recent Labs  Lab 01/24/20 0204 01/24/20 0204 01/25/20 0546 01/25/20 1615 01/26/20 0525 01/26/20 1620 01/27/20 0440 01/27/20 1535 01/28/20 0400  AST 39  --  92*  --  100*  --  154*  --  606*  ALT 9  --  15  --  14  --  20  --  29  ALKPHOS 202*  --  239*  --  225*  --  293*  --  317*  BILITOT 1.7*  --  3.3*  --  3.9*  --  5.4*  --  5.7*  PROT 5.8*  --  6.7  --  6.0*  --  6.5  --  5.8*  ALBUMIN 2.9*   < > 3.1*   < > 2.9* 2.7* 2.9* 2.8* 2.7*  2.8*   < > = values in this interval not displayed.   No results for input(s): LIPASE, AMYLASE in the last 168 hours. No results for input(s): AMMONIA in the last 168 hours. Coagulation Profile: Recent Labs  Lab 01/27/20 0440 01/28/20 0400  INR 1.3* 1.5*   Cardiac Enzymes: No results for input(s): CKTOTAL, CKMB, CKMBINDEX, TROPONINI in the last 168 hours. BNP (last 3 results) No results for input(s): PROBNP in the last 8760 hours. HbA1C: No results for input(s): HGBA1C in the last 72 hours. CBG: Recent Labs  Lab 01/27/20 0757 01/27/20 1227 01/27/20 1657 01/27/20 1949 01/28/20 0349  GLUCAP 160* 200* 179* 148* 182*   Lipid  Profile: No results for input(s): CHOL, HDL, LDLCALC, TRIG, CHOLHDL, LDLDIRECT in the last 72 hours. Thyroid Function Tests: No results for input(s): TSH, T4TOTAL, FREET4, T3FREE, THYROIDAB in the last 72 hours. Anemia Panel: Recent Labs    01/27/20 0440 01/28/20 0400  FERRITIN 642* >7,500*   Urine analysis: No results found for: COLORURINE, APPEARANCEUR, LABSPEC, PHURINE, GLUCOSEU, HGBUR, BILIRUBINUR, KETONESUR, PROTEINUR, UROBILINOGEN, NITRITE, LEUKOCYTESUR Sepsis Labs: @LABRCNTIP (procalcitonin:4,lacticidven:4)  ) Recent Results (from the past 240 hour(s))  MRSA PCR Screening     Status: None   Collection Time: 01/20/20  9:40 AM    Specimen: Nasopharyngeal  Result Value Ref Range Status   MRSA by PCR NEGATIVE NEGATIVE Final    Comment:        The GeneXpert MRSA Assay (FDA approved for NASAL specimens only), is one component of a comprehensive MRSA colonization surveillance program. It is not intended to diagnose MRSA infection nor to guide or monitor treatment for MRSA infections. Performed at [facility] [place] [address], [place], [place] [address]   Culture, blood (routine x 2)     Status: None   Collection Time: 01/22/20  7:54 PM   Specimen: BLOOD  Result Value Ref Range Status   Specimen Description BLOOD RIGHT ANTECUBITAL  Final   Special Requests   Final    BOTTLES DRAWN AEROBIC AND ANAEROBIC Blood Culture adequate volume   Culture   Final    NO GROWTH 5 DAYS Performed at [facility], [place] [address], [place], [place] [address]    Report Status 01/27/2020 FINAL  Final  Culture, blood (routine x 2)     Status: None   Collection Time: 01/22/20  8:04 PM   Specimen: BLOOD  Result Value Ref Range Status   Specimen Description BLOOD LEFT ANTECUBITAL  Final   Special Requests   Final    BOTTLES DRAWN AEROBIC ONLY Blood Culture results may not be optimal due to an inadequate volume of blood received in culture bottles   Culture   Final    NO GROWTH 5 DAYS Performed at [facility], [place] [address] [place], [place] [address]    Report Status 01/27/2020 FINAL  Final  MRSA PCR Screening     Status: None   Collection Time: 01/23/20  9:20 AM   Specimen: Nasal Mucosa; Nasopharyngeal  Result Value Ref Range Status   MRSA by PCR NEGATIVE NEGATIVE Final    Comment:        The GeneXpert MRSA Assay (FDA approved for NASAL specimens only), is one component of a comprehensive MRSA colonization surveillance program. It is not intended to diagnose MRSA infection nor to guide or monitor treatment for MRSA infections. Performed at [facility], [place] [address], [place], [place] [address]   Culture, Urine     Status: None   Collection Time: 01/24/20  4:43 PM   Specimen: Urine, Clean Catch  Result Value Ref Range Status   Specimen Description   Final    URINE, CLEAN CATCH Performed at [facility], [place] [address] [place], [place] [address]    Special Requests   Final    NONE Performed at [facility], [place] [address], [place], [place] [address]    Culture   Final    NO GROWTH Performed at [facility], [place] [address] [place],  [address]    Report Status 01/25/2020 FINAL  Final  MRSA PCR Screening  Status: None   Collection Time: 01/27/20  6:30 PM   Specimen: Nasopharyngeal Wash  Result Value Ref Range Status   MRSA by PCR NEGATIVE NEGATIVE Final    Comment:        The GeneXpert MRSA Assay (FDA approved for NASAL specimens only), is one component of a comprehensive MRSA colonization surveillance program. It is not intended to diagnose MRSA infection nor to guide or monitor treatment for MRSA infections. Performed at [facility], [place] [address], [place],  [address]          Radiology Studies: No results found.      Scheduled Meds: . carbidopa-levodopa  3 tablet Per Tube 3 times per day   And  . carbidopa-levodopa  2 tablet Per Tube QHS  . chlorhexidine  15 mL Mouth Rinse BID  . Chlorhexidine Gluconate Cloth  6 each Topical Daily  . insulin aspart  0-9 Units Subcutaneous TID WC  . mouth rinse  15 mL Mouth Rinse 10 times per day  . metoprolol tartrate  50 mg Oral BID  . pantoprazole sodium  40 mg Per Tube Daily  . rOPINIRole  3 mg Oral Q24H  . sodium chloride flush  3 mL Intravenous Q12H  . sodium chloride flush  3 mL Intravenous Q12H   Continuous Infusions: .  prismasol BGK 4/2.5 500 mL/hr at 01/28/20 0609  .  prismasol BGK 4/2.5 200 mL/hr at 01/27/20 2311  . sodium chloride    . ceFEPime (MAXIPIME) IV 2 g (01/27/20 2115)  .  dexmedetomidine (PRECEDEX) IV infusion Stopped (01/27/20 0523)  . dextrose Stopped (01/24/20 1939)  . dextrose 50 mL/hr at 01/28/20 0800  . fluconazole (DIFLUCAN) IV Stopped (01/27/20 1043)  . heparin 850 Units/hr (01/28/20 0800)  . norepinephrine (LEVOPHED) Adult infusion 1 mcg/min (01/28/20 0800)  . prismasol BGK 0/2.5 2,000 mL/hr at 01/28/20 0617     LOS: 10 days   The patient is critically ill with multiple organ systems failure and requires high complexity decision making for assessment and support, frequent evaluation and titration of therapies, application of advanced monitoring technologies and extensive interpretation of multiple databases. Critical Care Time devoted to patient care services described in this note  Time spent: 40 minutes     Darrold Bezek, Geraldo Docker, MD [facility] Pager [phone]  If 7PM-7AM, please contact night-coverage [email or web address] Password [facility] 01/28/2020, 8:06 AM

## 2020-01-28 NOTE — Progress Notes (Addendum)
ANTICOAGULATION CONSULT NOTE - Follow Up Consult  Pharmacy Consult for Heparin IV  Indication: DVT  Allergies  Allergen Reactions  . Iodine Anaphylaxis and Swelling    Throat swelling per daughter    Patient Measurements: Height: 5\' 11"  (180.3 cm) Weight: 180 lb 12.4 oz (82 kg) IBW/kg (Calculated) : 75.3 Heparin Dosing Weight: actual weight  Vital Signs: Temp: 96.8 F (36 C) (02/23 0700) BP: 108/73 (02/23 0700) Pulse Rate: 75 (02/23 0700)  Labs: Recent Labs    01/26/20 0525 01/26/20 1620 01/27/20 0440 01/27/20 0440 01/27/20 1426 01/27/20 1535 01/27/20 1830 01/27/20 2320 01/28/20 0400  HGB 14.9  --  15.6  --   --   --   --   --  15.0  HCT 44.2  --  46.0  --   --   --   --   --  45.5  PLT 147*  --  191  --   --   --   --   --  140*  LABPROT  --   --  16.5*  --   --   --   --   --  18.4*  INR  --   --  1.3*  --   --   --   --   --  1.5*  HEPARINUNFRC 0.67  --  0.79*  --  0.73*  --   --  0.74*  --   CREATININE 2.94*   < > 2.02*   < >  --  2.16* 2.05*  --  1.74*  1.72*   < > = values in this interval not displayed.    Estimated Creatinine Clearance: 39.1 mL/min (A) (by C-G formula based on SCr of 1.74 mg/dL (H)).   Medications:  Infusions:  .  prismasol BGK 4/2.5 500 mL/hr at 01/28/20 0609  .  prismasol BGK 4/2.5 200 mL/hr at 01/27/20 2311  . sodium chloride    . ceFEPime (MAXIPIME) IV 2 g (01/27/20 2115)  . dexmedetomidine (PRECEDEX) IV infusion Stopped (01/27/20 0523)  . dextrose Stopped (01/24/20 1939)  . dextrose 50 mL/hr at 01/28/20 0700  . fluconazole (DIFLUCAN) IV Stopped (01/27/20 1043)  . heparin 850 Units/hr (01/28/20 0700)  . norepinephrine (LEVOPHED) Adult infusion 1 mcg/min (01/28/20 0700)  . prismasol BGK 0/2.5 2,000 mL/hr at 01/28/20 0617    Assessment: 1 YOM with COVID-19 pneumonia. Dopplers revealed an acute L-DVT and R-superficial thrombosis. Pharmacy consulted for Lovenox on 2/16, but patient now with worsening renal function.  Pharmacy  is consulted to transition to IV Heparin.  Heparin level 0.41, therapeutic on heparin at 850 units/hr. CBC: H/H stable/wnl and Plt decreased to 140k. RN reports on 2/22 that the patient had a dark BM, with FOB positive.  RN notified MD at that time.  No further stools w/ bleeding on 2/23, but RN mentions CVC site is oozing. D-dimer: >20, 9.9, 12.9, 9.93 Remains on CRRT, no filter clotting noted.  Goal of Therapy:  Heparin level 0.3-0.5 units/ml Monitor platelets by anticoagulation protocol: Yes   Plan:  Continue heparin IV infusion at 850 units/hr Confirmatory Heparin level in 8 hours  Daily heparin level and CBC   Gretta Arab PharmD, BCPS Clinical pharmacist phone 7am- 5pm: [phone] 01/28/2020 7:12 AM

## 2020-01-28 NOTE — Evaluation (Signed)
Clinical/Bedside Swallow Evaluation Patient Details  Name: Christopher Travis MRN: [id number] Date of Birth: [birth year]  Today's Date: 01/28/2020 Time: SLP Start Time (ACUTE ONLY): 1820 SLP Stop Time (ACUTE ONLY): 1900 SLP Time Calculation (min) (ACUTE ONLY): 40 min  Past Medical History:  Past Medical History:  Diagnosis Date  . Hypertension   . Non Hodgkin's lymphoma ([place])   . Parkinson disease [facility])    Past Surgical History: History reviewed. No pertinent surgical history. HPI:   74 y.o. male with medical history significant for Parkinson disease and non-Hodgkins lymphoma in remission, who developed fevers and malaise after a family member with asymptomatic COVID visited him.  Admitted to [facility] 01/31/2020 with COVID 19 pna, sepsis, AKI, hyperbilirubinema, acute metabolic encephalopathy due to multiorgan failure. Developed worsening renal failure and was started on CRRT on 01/25/20, transferred back to ICU.  On 2/21, RN noted NG coiled in pt's mouth, removed 2/22 and swallow eval ordered.   Assessment / Plan / Recommendation Clinical Impression  Pt presents with significant dysphagia.  Video intrepretor utilized during session. Oral mechanism exam revealed diffuse, dried secretions covering palate and pharyngeal walls that were either bloody or tinged bright red due to the cherry chloroseptic spray that has been used for his comfort.  Oral care provided as able - pt with sensitive gag reflex and it was difficult to remove all of the debris. Unable to achieve voice despite obvious effort on his part; cough quite weak.  Attempted several ice chips, teaspoons water, and one bolus of applesauce.  Pt had difficulty sealing lips around spoon, he generated obvious effort to initiate a swallow, and required 5-7 sub-swallows with each bolus - through translator, he answered no when asked if applesauce was passing through.  He had delayed, soft cough after PO trials.  His swallow is extremely weak at this time, and he  is at high risk for aspiration given his deconditioned state, weak cough that offers questionable protection, near aphonia, and continued high respiratory needs (HFNC at 35L, Fi02 100%, RR ranging from high 20s to mid 30s).  Recommend proceeding with cortrak tomorrow; ensure oral care is vigilant and assess pharynx to determine presence of blood vs red chloroseptic spray coating pharyngeal walls.  SLP will follow for PO readiness.  D/W RN.   SLP Visit Diagnosis: Dysphagia, unspecified (R13.10)    Aspiration Risk  Severe aspiration risk    Diet Recommendation   npo; intensive and frequent oral care  Medication Administration: Via alternative means    Other  Recommendations Oral Care Recommendations: Oral care QID   Follow up Recommendations        Frequency and Duration min 2x/week  2 weeks       Prognosis Prognosis for Safe Diet Advancement: Fair Barriers to Reach Goals: Cognitive deficits;Severity of deficits      Swallow Study   General Date of Onset: 01/16/2020 HPI:  74 y.o. male with medical history significant for Parkinson disease and non-Hodgkins lymphoma in remission, who developed fevers and malaise after a family member with asymptomatic COVID visited him.  Admitted to [facility] 01/19/2020 with COVID 19 pna, sepsis, AKI, hyperbilirubinema, acute metabolic encephalopathy due to multiorgan failure. Developed worsening renal failure and was started on CRRT on 01/25/20, transferred back to ICU.  On 2/21, RN noted NG coiled in pt's mouth, removed 2/22 and swallow eval ordered. Type of Study: Bedside Swallow Evaluation Previous Swallow Assessment: no Diet Prior to this Study: NPO Temperature Spikes Noted: No Respiratory Status: Nasal cannula(HFNC; 35L ,  100% fi02) History of Recent Intubation: No Behavior/Cognition: Alert Oral Cavity Assessment: Lesions;Dried secretions Oral Care Completed by SLP: Yes Oral Cavity - Dentition: Missing dentition Self-Feeding Abilities: Total  assist Patient Positioning: Upright in bed Baseline Vocal Quality: Aphonic Volitional Cough: Weak Volitional Swallow: Able to elicit    Oral/Motor/Sensory Function Overall Oral Motor/Sensory Function: Generalized oral weakness   Ice Chips Ice chips: Impaired Presentation: Spoon Oral Phase Impairments: Reduced labial seal;Reduced lingual movement/coordination Pharyngeal Phase Impairments: Suspected delayed Swallow;Multiple swallows   Thin Liquid Thin Liquid: Impaired Presentation: Spoon Oral Phase Impairments: Reduced labial seal Pharyngeal  Phase Impairments: Multiple swallows;Cough - Delayed    Nectar Thick Nectar Thick Liquid: Not tested   Honey Thick Honey Thick Liquid: Not tested   Puree Puree: Impaired Presentation: Spoon Oral Phase Impairments: Reduced labial seal Pharyngeal Phase Impairments: Multiple swallows;Cough - Delayed   Solid     Solid: Not tested      Amante Quam Laurice 01/28/2020,7:35 PM  Estill Bamberg L. Tivis Ringer, [place] Office number [phone]

## 2020-01-28 NOTE — Progress Notes (Signed)
Occupational Therapy Treatment Patient Details Name: Christopher Travis MRN: [id number] DOB: October 02, 1946 Today's Date: 01/28/2020    History of present illness  74 y.o. male with medical history significant for Parkinson disease and non-Hodgkin lymphoma in remission, who developed subjective fevers and malaise roughly a week ago after a family member with asymptomatic COVID visited him. He went on to develop SOB 2-3 days ago, was monitoring oxygen saturations at home, began to see readings in 80's, and [facility] that reasonFormally dx with acute respiratory failure with hypoxia secondary to COIVD 19 PNA and possibly CAP, sepsis, AKI, hyperbilirubinema.     OT comments  Interpreter utilized for session. Pt noted to be incontinent of bowel in bed with pt requiring total assist x 2 to roll and get cleaned up. Pt placed in chair position with head of bed at 53 degrees. See BP readings below. Pt able to tolerate chair position ~30 min with SpO2 maintaining in 90s on 35L HHFNC @ 100% FiO2. Continued education/instruction with pt on BUE ROM exercises to increase function and strength. Pt unable to initiate right shld flex, however able to hold 45 degree RUE shld flex 5 seconds x 5 times. Pt completed RUE elbow flex/ext x 5 requiring assist to initiate movement. Pt completed BUE finger flex/ext x 5 with tactile cues to initiate. Pt completed LUE shld flex to ~90 degrees x 5. Pt completed LUE elbow flex/ext x 5. Pt demonstrated improved BUE movement this date. RN present and aware. OT will continue to follow acutely.   BP semi-reclined at start of session: 81/12mmHg.  BP HOB 41 degrees: 120/21mmHg.  BP 13 min later with HOB 53 degrees: 116/61mmHg.  BP 5 min later with HOB 53 degrees: 103/63mmHg.    Follow Up Recommendations  SNF;Supervision/Assistance - 24 hour    Equipment Recommendations  3 in 1 bedside commode    Recommendations for Other Services      Precautions / Restrictions  Precautions Precautions: Fall;Other (comment) Precaution Comments: CRRT - jugular access. Monitor SpO2, 35L HHFNC @100 %FiO2. Parkinson's with tremors.  Restrictions Weight Bearing Restrictions: No       Mobility Bed Mobility Overal bed mobility: Needs Assistance Bed Mobility: Rolling Rolling: Total assist;+2 for physical assistance;+2 for safety/equipment         General bed mobility comments: Total assist x 2 to roll and scoot up in bed.   Transfers                 General transfer comment: Not attempted this date. Pt able to tolerate chair position ~30 minutes    Balance                                           ADL either performed or assessed with clinical judgement   ADL Overall ADL's : Needs assistance/impaired                             Toileting- Clothing Manipulation and Hygiene: +2 for physical assistance;+2 for safety/equipment;Total assistance;Bed level Toileting - Clothing Manipulation Details (indicate cue type and reason): Pt incontinent of bowel in bed       General ADL Comments: Pt able to tolerate chair position ~30 min this date.      Vision       Perception     Praxis  Cognition Arousal/Alertness: Lethargic Behavior During Therapy: Flat affect Overall Cognitive Status: Impaired/Different from baseline                                 General Comments: Utilized interpreter for session. Pt able to nod head yes/no to answer questions. Pt lethargic throughout session requiring cues to maintain alertness. Pt following ~75% of simple one step commands.         Exercises Exercises: Other exercises Other Exercises Other Exercises: RUE AROM exercises. Pt able to hold shld flex at 45 degrees for 5 secs x 5 times. Pt able to complete AROM elbow flex/ext x 5 with min assist to initiate movement due to weakness. Pt able to complete finger flex/ext x 5. Other Exercises: LUE AROM shld flex to 90  degrees x 5, elbow flex/ext x 5, and finger flex/ext x 5.    Shoulder Instructions       General Comments Pt on 35L HHFNC @100 % FiO2. SpO2 dropped to mid 80s in supine when getting cleaned up. SpO2 maintained in 90s when in chair position. Pt able to tolerate chair position ~30 min. RR 20s-30s.     Pertinent Vitals/ Pain       Pain Assessment: Faces Faces Pain Scale: No hurt  Home Living                                          Prior Functioning/Environment              Frequency           Progress Toward Goals  OT Goals(current goals can now be found in the care plan section)  Progress towards OT goals: Progressing toward goals  ADL Goals Pt Will Perform Grooming: with set-up;with supervision;sitting Pt Will Perform Lower Body Dressing: with set-up;with supervision;sit to/from stand Pt Will Transfer to Toilet: ambulating;bedside commode;with min guard assist Pt Will Perform Toileting - Clothing Manipulation and hygiene: with min guard assist;sit to/from stand;sitting/lateral leans Pt/caregiver will Perform Home Exercise Program: Increased ROM;Increased strength;Both right and left upper extremity;With Supervision Additional ADL Goal #1: Pt will independently verablize three energy conservation techniques for ADLs Additional ADL Goal #2: Pt will monitor SpO2 and utilize purse lip breathing with 2-3 cues during ADLs  Plan Discharge plan remains appropriate    Co-evaluation    PT/OT/SLP Co-Evaluation/Treatment: Yes Reason for Co-Treatment: Complexity of the patient's impairments (multi-system involvement);For patient/therapist safety   OT goals addressed during session: Strengthening/ROM      AM-PAC OT "6 Clicks" Daily Activity     Outcome Measure   Help from another person eating meals?: Total Help from another person taking care of personal grooming?: A Lot Help from another person toileting, which includes using toliet, bedpan, or urinal?:  Total Help from another person bathing (including washing, rinsing, drying)?: Total Help from another person to put on and taking off regular upper body clothing?: Total Help from another person to put on and taking off regular lower body clothing?: Total 6 Click Score: 7    End of Session Equipment Utilized During Treatment: Oxygen  OT Visit Diagnosis: Unsteadiness on feet (R26.81);Other abnormalities of gait and mobility (R26.89);Muscle weakness (generalized) (M62.81)   Activity Tolerance Patient limited by fatigue;Patient limited by lethargy   Patient Left in bed;with nursing/sitter in room   Nurse Communication Mobility  status        Time: 1330-1409 OT Time Calculation (min): 39 min  Charges: OT General Charges $OT Visit: 1 Visit OT Treatments $Therapeutic Activity: 8-22 mins $Therapeutic Exercise: 8-22 mins  Mauri Brooklyn OTR/L [phone]   Mauri Brooklyn 01/28/2020, 2:29 PM

## 2020-01-28 NOTE — Progress Notes (Signed)
ANTICOAGULATION CONSULT NOTE - Follow Up Consult  Pharmacy Consult for Heparin IV  Indication: DVT  Allergies  Allergen Reactions  . Iodine Anaphylaxis and Swelling    Throat swelling per daughter    Patient Measurements: Height: 5\' 11"  (180.3 cm) Weight: 180 lb 12.4 oz (82 kg) IBW/kg (Calculated) : 75.3 Heparin Dosing Weight: actual weight  Vital Signs: Temp: 96.3 F (35.7 C) (02/23 1600) Temp Source: Axillary (02/23 1600) BP: 104/60 (02/23 2015) Pulse Rate: 87 (02/23 2015)  Labs: Recent Labs    01/26/20 0525 01/26/20 1620 01/27/20 0440 01/27/20 0440 01/27/20 1426 01/27/20 1830 01/27/20 2320 01/28/20 0400 01/28/20 1026 01/28/20 1256 01/28/20 1656 01/28/20 1830  HGB 14.9  --  15.6   < >  --   --   --  15.0  --  14.2  --   --   HCT 44.2  --  46.0  --   --   --   --  45.5  --  42.8  --   --   PLT 147*  --  191  --   --   --   --  140*  --   --   --   --   LABPROT  --   --  16.5*  --   --   --   --  18.4*  --   --   --   --   INR  --   --  1.3*  --   --   --   --  1.5*  --   --   --   --   HEPARINUNFRC 0.67  --  0.79*  --    < >  --  0.74*  --  0.42  --   --  0.30  CREATININE 2.94*   < > 2.02*  --    < > 2.05*  --  1.74*  1.72*  --   --  1.68*  --    < > = values in this interval not displayed.    Estimated Creatinine Clearance: 40.5 mL/min (A) (by C-G formula based on SCr of 1.68 mg/dL (H)).   Medications:  Infusions:  .  prismasol BGK 4/2.5 500 mL/hr at 01/28/20 1540  .  prismasol BGK 4/2.5 200 mL/hr at 01/28/20 2025  . sodium chloride    . ceFEPime (MAXIPIME) IV Stopped (01/28/20 [id number])  . dexmedetomidine (PRECEDEX) IV infusion Stopped (01/27/20 0523)  . dextrose 50 mL/hr at 01/28/20 2000  . [START ON 02-11-2020] feeding supplement (PIVOT 1.5 CAL)    . heparin 850 Units/hr (01/28/20 2000)  . norepinephrine (LEVOPHED) Adult infusion 4 mcg/min (01/28/20 2000)  . prismasol BGK 2/2.5 dialysis solution 2,000 mL/hr at 01/28/20 1729    Assessment: 61 YOM  with COVID-19 pneumonia. Dopplers revealed an acute L-DVT and R-superficial thrombosis. Pharmacy consulted for Lovenox on 2/16, but patient now with worsening renal function.  Pharmacy is consulted to transition to IV Heparin.  Heparin level 0.30, therapeutic on heparin at 850 units/hr. CBC: H/H stable/wnl and Plt decreased to 140k. RN reports on 2/22 that the patient had a dark BM, with FOB positive.  RN notified MD at that time.  No further stools w/ bleeding on 2/23, but RN mentions CVC site is oozing. D-dimer: >20, 9.9, 12.9, 9.93 Remains on CRRT, no filter clotting noted.  Goal of Therapy:  Heparin level 0.3-0.5 units/ml Monitor platelets by anticoagulation protocol: Yes   Plan:  Continue heparin IV infusion at 850 units/hr Daily heparin  level and CBC Monitor for signs and symptoms of bleeding     Royetta Asal, PharmD, BCPS 01/28/2020 8:46 PM

## 2020-01-28 NOTE — Progress Notes (Addendum)
[facility] Progress Note    Assessment/ Plan:   1.  Acute oliguric kidney injury: Cr up to 4.11 with BUN up to 185. Started CRRT 2/20. ^K+ last night, back on 0K dialysate. Pre and post are 4K.   -  no vol excess on exam and BP's soft, will do +25 cc/hr for now  2.  Hyperkalemia: better back on 0/2.5 dialysate, will need lower K+ dialysate, will try 2/2.5 , have d/w pharm  3.  Acute hypoxic RF: d/t COVID and probable PE, on decadron, s/p remdesivir, on cefepime and conazole and hep gtt  4.  Parkinson's: on Sinemet  5.  Thrombocytopenia: plts 105, HIT negative  6.  Elevated LFTs: likely d/t COVID infection, w/u per primary    Subjective:     Patient not examined today directly given COVID-19 + status, utilizing data taken from chart +/- discussions w/ providers and staff.     Objective:   BP 92/60   Pulse 78   Temp (!) 96.3 F (35.7 C) (Axillary)   Resp 20   Ht 5\' 11"  (1.803 m)   Wt 82 kg   SpO2 97%   BMI 25.21 kg/m   Intake/Output Summary (Last 24 hours) at 01/28/2020 1610 Last data filed at 01/28/2020 1500 Gross per 24 hour  Intake 1498.06 ml  Output 246 ml  Net 1252.06 ml   Weight change: 1.1 kg  Physical Exam:  Patient not examined today directly given COVID-19 + status, utilizing data taken from chart +/- discussions w/ providers and staff.      Imaging: DG CHEST PORT 1 VIEW  Result Date: 01/28/2020 CLINICAL DATA:  Respiratory failure EXAM: PORTABLE CHEST 1 VIEW COMPARISON:  01/25/2020 FINDINGS: Left IJ central line is unchanged. Persistent interstitial opacities with improving lung aeration. No pleural effusion or pneumothorax. Stable cardiomediastinal contours. IMPRESSION: Persistent interstitial opacities with improving aeration. Electronically Signed   By: Macy Mis M.D.   On: 01/28/2020 09:17    Labs: BMET Recent Labs  Lab 01/25/20 0546 01/25/20 0546 01/25/20 1615 01/26/20 0525 01/26/20 1620 01/27/20 0440  01/27/20 1535 01/27/20 1830 01/28/20 0400  NA 152*   < > 153* 151* 145 142 140 140 138  136  K 4.8   < > 5.2* 4.9 5.2* 5.0 6.2* 5.8* 4.5  4.2  CL 112*   < > 116* 112* 110 104 105 104 100  99  CO2 18*   < > 18* 22 23 18* 18* 17* 21*  20*  GLUCOSE 222*   < > 219* 118* 137* 156* 215* 179* 180*  178*  BUN 185*   < > 181* 62* 65* 83* 71* 69* 59*  58*  CREATININE 4.11*   < > 3.97* 2.94* 2.11* 2.02* 2.16* 2.05* 1.74*  1.72*  CALCIUM 7.8*   < > 7.7* 7.7* 7.4* 8.2* 8.1* 8.0* 8.1*  8.2*  PHOS 6.3*  --  5.5* 4.1 4.1 4.7* 5.3*  --  4.5  4.6   < > = values in this interval not displayed.   CBC Recent Labs  Lab 01/25/20 0546 01/25/20 0546 01/26/20 0525 01/27/20 0440 01/28/20 0400 01/28/20 1256  WBC 38.2*  --  46.9* 54.6* 56.6*  --   NEUTROABS 36.3*  --  43.5* 51.0* 52.8*  --   HGB 16.4   < > 14.9 15.6 15.0 14.2  HCT 49.5   < > 44.2 46.0 45.5 42.8  MCV 94.8  --  94.4 93.7 96.4  --   PLT  135*  --  147* 191 140*  --    < > = values in this interval not displayed.    Medications:    . carbidopa-levodopa  3 tablet Per Tube 3 times per day   And  . carbidopa-levodopa  2 tablet Per Tube QHS  . chlorhexidine  15 mL Mouth Rinse BID  . Chlorhexidine Gluconate Cloth  6 each Topical Daily  . insulin aspart  0-9 Units Subcutaneous TID WC  . mouth rinse  15 mL Mouth Rinse 10 times per day  . metoprolol tartrate  50 mg Oral BID  . pantoprazole (PROTONIX) IV  40 mg Intravenous Q12H  . rOPINIRole  3 mg Oral Q24H  . sodium chloride flush  10-40 mL Intracatheter Q12H  . sodium chloride flush  3 mL Intravenous Q12H  . sodium chloride flush  3 mL Intravenous Q12H

## 2020-01-28 NOTE — Progress Notes (Signed)
NAME:  Christopher Travis, MRN:  [id number], DOB:  [birth year], LOS: 4 ADMISSION DATE:  01/21/2020, CONSULTATION DATE:  01/26/20 REFERRING MD:  Sherral Hammers, CHIEF COMPLAINT:  dyspnea   Brief History   74 y/o male presented on 2/12 in the setting of COVID 19 pneumonia.  He indicated on admission that he desired code status to be DNR.  He developed worsening renal failure and was started on CRRT on 2/20.  Past Medical History  Parkinson's disease Non-Hodgkins lymphoma Hypertension  Significant Hospital Events   2/12 admission to ICU 2/14 transfer to floor 2/20 transfer back to ICU for CRRT 2/21 start precedex and levophed  Consults:  Nephrology PCCM  Procedures:  2/20 L IJ HD cath >   Significant Diagnostic Tests:  2/17 CT head > NAICP 2/18 renal ultrasound > no acute focal abnormality  Micro Data:  2/12 blood > coag neg staph 1/4  213 sputum > not collected 2/17 blood >  2/19 urine >   Antimicrobials/COVID Rx:  2/12 azithro > 2/16 2/12 ceftriaxone > 2/16 2/12 remdesivir > 2/16  2/15 tocilizumab x1  2/18 cefepime >  2/18 fulconazole >   Interim history/subjective:   Question of aspiration overnight? Remains on low dose vasopressor   Objective   Blood pressure 108/73, pulse 75, temperature (!) 96.8 F (36 C), resp. rate 16, height 5\' 11"  (1.803 m), weight 82 kg, SpO2 96 %.    FiO2 (%):  [90 %-100 %] 100 %   Intake/Output Summary (Last 24 hours) at 01/28/2020 [id number] Last data filed at 01/28/2020 0700 Gross per 24 hour  Intake 1888.08 ml  Output 1109 ml  Net 779.08 ml   Filed Weights   01/26/20 0500 01/27/20 0500 01/28/20 0500  Weight: 83.1 kg 80.9 kg 82 kg    Examination:  General:  Resting comfortably in bed HENT: NCAT OP clear PULM: CTA B, normal effort CV: RRR, no mgr GI: BS+, soft, nontender MSK: normal bulk and tone Neuro: awake, alert, answers questions, [facility] Problem list     Assessment & Plan:  Acute metabolic encephalopathy  due to mult-organ failure, shock, hypoxemia Minimize sedatives Out of bed as able Frequent orientation Lights on during daytime, off at night  Acute hypoxemic respiratory failure due to COVID 19 pneumonia** Tolerate periods of hypoxemia, goal at rest is greater than 85% SaO2, with movement ideally above 75% Out of bed to chair as able Incentive spirometry is important, use every hour Hold off on prone positioning on CRRT  AKI Monitor BMET and UOP Replace electrolytes as needed CRRT per renal/TRH  Shock Wean off levophed for MAP > 65 Monitor hemodynamics  Overall prognosis poor  Code status DNR. See my note from 2/22  Best practice:   Per [facility]  Labs   CBC: Recent Labs  Lab 01/24/20 0204 01/25/20 0546 01/26/20 0525 01/27/20 0440 01/28/20 0400  WBC 32.8* 38.2* 46.9* 54.6* 56.6*  NEUTROABS 31.3* 36.3* 43.5* 51.0* 52.8*  HGB 15.3 16.4 14.9 15.6 15.0  HCT 45.0 49.5 44.2 46.0 45.5  MCV 93.8 94.8 94.4 93.7 96.4  PLT 110* 135* 147* 191 140*    Basic Metabolic Panel: Recent Labs  Lab 01/25/20 0546 01/25/20 1615 01/26/20 0525 01/26/20 0525 01/26/20 1620 01/27/20 0440 01/27/20 1535 01/27/20 1830 01/28/20 0400  NA 152*   < > 151*   < > 145 142 140 140 138  136  K 4.8   < > 4.9   < > 5.2* 5.0 6.2* 5.8*  4.5  4.2  CL 112*   < > 112*   < > 110 104 105 104 100  99  CO2 18*   < > 22   < > 23 18* 18* 17* 21*  20*  GLUCOSE 222*   < > 118*   < > 137* 156* 215* 179* 180*  178*  BUN 185*   < > 62*   < > 65* 83* 71* 69* 59*  58*  CREATININE 4.11*   < > 2.94*   < > 2.11* 2.02* 2.16* 2.05* 1.74*  1.72*  CALCIUM 7.8*   < > 7.7*   < > 7.4* 8.2* 8.1* 8.0* 8.1*  8.2*  MG 3.3*  --  3.0*  --   --  2.8*  --  3.3* 2.8*  PHOS 6.3*   < > 4.1  --  4.1 4.7* 5.3*  --  4.5  4.6   < > = values in this interval not displayed.   GFR: Estimated Creatinine Clearance: 39.1 mL/min (A) (by C-G formula based on SCr of 1.74 mg/dL (H)). Recent Labs  Lab 01/22/20 0249 01/23/20 0020  01/25/20 0546 01/26/20 0525 01/27/20 0440 01/28/20 0400  PROCALCITON 3.18  --   --   --   --   --   WBC 29.6*   < > 38.2* 46.9* 54.6* 56.6*   < > = values in this interval not displayed.    Liver Function Tests: Recent Labs  Lab 01/24/20 0204 01/24/20 0204 01/25/20 0546 01/25/20 1615 01/26/20 0525 01/26/20 1620 01/27/20 0440 01/27/20 1535 01/28/20 0400  AST 39  --  92*  --  100*  --  154*  --  606*  ALT 9  --  15  --  14  --  20  --  29  ALKPHOS 202*  --  239*  --  225*  --  293*  --  317*  BILITOT 1.7*  --  3.3*  --  3.9*  --  5.4*  --  5.7*  PROT 5.8*  --  6.7  --  6.0*  --  6.5  --  5.8*  ALBUMIN 2.9*   < > 3.1*   < > 2.9* 2.7* 2.9* 2.8* 2.7*  2.8*   < > = values in this interval not displayed.   No results for input(s): LIPASE, AMYLASE in the last 168 hours. No results for input(s): AMMONIA in the last 168 hours.  ABG    Component Value Date/Time   PHART 7.470 (H) 01/20/2020 0535   PCO2ART 28.4 (L) 01/20/2020 0535   PO2ART 37.0 (LL) 01/20/2020 0535   HCO3 20.7 01/20/2020 0535   TCO2 22 01/20/2020 0535   ACIDBASEDEF 2.0 01/20/2020 0535   O2SAT 76.0 01/20/2020 0535     Coagulation Profile: Recent Labs  Lab 01/27/20 0440 01/28/20 0400  INR 1.3* 1.5*    Cardiac Enzymes: No results for input(s): CKTOTAL, CKMB, CKMBINDEX, TROPONINI in the last 168 hours.  HbA1C: No results found for: HGBA1C  CBG: Recent Labs  Lab 01/27/20 0757 01/27/20 1227 01/27/20 1657 01/27/20 1949 01/28/20 0349  GLUCAP 160* 200* 179* 148* 182*    Review of Systems:   Cannot obtain due to confusion  Past Medical History  He,  has a past medical history of Hypertension, Non Hodgkin's lymphoma ([place]), and Parkinson disease ([place]).   Surgical History   History reviewed. No pertinent surgical history.   Social History   reports that he has never smoked. He has never  used smokeless tobacco. He reports previous alcohol use. He reports that he does not use drugs.   Family  History   His family history includes Liver cancer in his mother.   Allergies Allergies  Allergen Reactions  . Iodine Anaphylaxis and Swelling    Throat swelling per daughter     Home Medications  Prior to Admission medications   Medication Sig Start Date End Date Taking? Authorizing Provider  amLODipine-valsartan (EXFORGE) 5-320 MG tablet Take 1 tablet by mouth daily. 12/19/19  Yes [provider]  carbidopa-levodopa (SINEMET CR) 50-200 MG tablet Take 2 tablets by mouth daily at 10 pm. 11/15/19  Yes [provider]  carbidopa-levodopa (SINEMET IR) 25-100 MG tablet Take 1-2 tablets by mouth See admin instructions. Take 2 tablets by mouth at 0700, 2 tablets by mouth at noon, take 2 tablets by mouth at 1700, & take 1 tablet at 2200   Yes [provider]  Cholecalciferol (VITAMIN D3 PO) Take 1 tablet by mouth daily.   Yes [provider]  Cyanocobalamin (VITAMIN B-12 PO) Take 1 tablet by mouth daily.   Yes [provider]  rOPINIRole (REQUIP) 3 MG tablet Take 3 mg by mouth daily. (0700) 11/15/19  Yes [provider]     Critical care time: 30 minutes    Roselie Awkward, MD [place] [facility] Pager: [phone] Cell: [phone] If no response, call [phone]

## 2020-01-28 NOTE — Progress Notes (Signed)
Pharmacy Antibiotic Note  Christopher Travis is a 74 y.o. male admitted on 01/13/2020 with COVID-19 pneumonia now concerning for superimposed bacterial pneumonia.  Pharmacy dosing Cefepime. Remains on CRRT today. Persistent leukocytosis, no fever. Culture negative to date   Plan: -Continue Cefepime to 2 gm IV Q 12 hours while on CRRT  - Stop date after 7 days per MD.  Pharmacy to sign off. -Monitor CBC, cultures and clinical progress  Height: 5\' 11"  (180.3 cm) Weight: 180 lb 12.4 oz (82 kg) IBW/kg (Calculated) : 75.3  Temp (24hrs), Avg:97.1 F (36.2 C), Min:96.1 F (35.6 C), Max:97.9 F (36.6 C)  Recent Labs  Lab 01/24/20 0204 01/24/20 0204 01/25/20 0546 01/25/20 1615 01/26/20 0525 01/26/20 0525 01/26/20 1620 01/27/20 0440 01/27/20 1535 01/27/20 1830 01/28/20 0400  WBC 32.8*  --  38.2*  --  46.9*  --   --  54.6*  --   --  56.6*  CREATININE 3.44*   < > 4.11*   < > 2.94*   < > 2.11* 2.02* 2.16* 2.05* 1.74*  1.72*   < > = values in this interval not displayed.    Estimated Creatinine Clearance: 39.1 mL/min (A) (by C-G formula based on SCr of 1.74 mg/dL (H)).    Allergies  Allergen Reactions  . Iodine Anaphylaxis and Swelling    Throat swelling per daughter    Antimicrobials this admission: remdesivir 2/12>>2/16 azithromycin 2/13>> 2/17 ceftraixone 2/13>>2/17 Actemra 2/15  Cefepime 2/18 >>2/24 Fluconazole 2/18 >>2/23  Microbiology results: 2/12 BCx x 2>> 1/4 (CoNS)  2/15 MRSA PCR: neg 2/17 BCx: NGF 2/18 MRSA PCR: negative 2/19 UCx: NGF 2/22 MRSA PCR: neg  Thank you for allowing pharmacy to be a part of this patient's care.  Gretta Arab PharmD, BCPS Clinical pharmacist phone 7am- 5pm: [phone] 01/28/2020 10:57 AM

## 2020-01-28 NOTE — Progress Notes (Signed)
Update given to pt daughter earlier this afternoon. Pt daughter understands pts desire to not facetime at this time. RN encourages pt to speak with family and reports message to pt that they love him. Pt appears tearful, however continues to deny facetime. Attempted call to pt daughter for update x2, however phone line cuts off after one ring.  Pt currently appears comfortable, CRRT running without issues. Translator services used frequently and all needs med. PT/OT at bedside, pt tolerated well. Plan for speech to come this evening. Cortrak placement tomorrow.

## 2020-01-28 NOTE — Progress Notes (Signed)
CRRT filter changed due to running for 72hours

## 2020-01-28 NOTE — Progress Notes (Signed)
[place] for IV Heparin  Indication: DVT  Assessment: 39 YOM with COVID-19 pneumonia. Dopplers revealed an acute L-DVT and R-superficial thrombosis.  Heparin level remains slightly elevated at 0.74 units/ml   Goal of Therapy:  Heparin level 0.3-0.7 units/ml Monitor platelets by anticoagulation protocol: Yes   Plan:  - Decrease heparin drip to 850 units/hr  Check heparin level in 8 hours - Daily heparin level, CBC - Monitor for s/sx of bleeding  Thank you for involving pharmacy in this patient's care.  Excell Seltzer, PharmD Clinical Pharmacist

## 2020-01-28 NOTE — Progress Notes (Addendum)
Nutrition Follow-up   DOCUMENTATION CODES:   Severe malnutrition in context of chronic illness  INTERVENTION:   Once Cortrak tube placed will initiate  Pivot 1.5 @ 35 ml/hr and increase by 10 ml every 8 hours to goal rate of 65 ml/hr   Provides: 2340 kcal, 146 grams protein, and 1184 ml free water.   Monitor magnesium and phosphorus every 12 hours x 4 occurances, MD to replete as needed, as pt is at risk for refeeding syndrome given poor nutrition during 10 day admission.   NUTRITION DIAGNOSIS:   Increased nutrient needs related to catabolic EAVWUJW([id number]) as evidenced by estimated needs. Ongoing.   GOAL:   Patient will meet greater than or equal to 90% of their needs Not met.   MONITOR:   Diet advancement, TF tolerance  REASON FOR ASSESSMENT:   Malnutrition Screening Tool    ASSESSMENT:   74 year old male with PMHx of Parkinson's disease, Non Hodgkin's lymphoma in remission, HTN admitted with PNA due to COVID-19, sepsis, AKI.   Palliative following, pt is a DNR. Pt on 35 L HHFNC and NRB.   2/18 noted pt with acute decline, family came in for a visit with patient. 2/20 transfer to ICU and CRRT initiated for AKI 2/21 pt started on precedex and levophed; 14 F NG tube placed but TF not ordered due to iodine allergy. RN noted that NG coiled in pt's mouth so removed on 2/22 2/23 spoke with MD, plan for swallow eval today. If he does not pass will place a cortrak tube tomorrow. Per MD pt should prone as able  2/24 cortrak placed, tip gastric near pylorus. NFPE completed. Pt meets criteria for severe malnutrition.   Medications reviewed and include: SSI D5 @ 50 ml/hr  Levophed @ 5 mcg  Labs reviewed:  CBG's: 161-133   Diet Order:   Diet Order            Diet full liquid Room service appropriate? Yes; Fluid consistency: Thin  Diet effective now             EDUCATION NEEDS:   No education needs have been identified at this time  Skin:  Skin Assessment:  Reviewed RN Assessment  Last BM:  2/23 medium  Height:   Ht Readings from Last 1 Encounters:  01/18/20 _0  (1.803 m)   Weight:   Wt Readings from Last 1 Encounters:  24-Feb-2020 79.2 kg   BMI:  Body mass index is 24.35 kg/m.  Estimated Nutritional Needs:   Kcal:  2200-2400  Protein:  120-160 grams  Fluid:  per MD  Lockie Pares., RD, LDN, CNSC See AMiON for contact information

## 2020-01-28 NOTE — Progress Notes (Signed)
Physical Therapy Treatment Patient Details Name: Christopher Travis MRN: [id number] DOB: [birth year] Today's Date: 01/28/2020    History of Present Illness  74 y.o. male with medical history significant for Parkinson disease and non-Hodgkin lymphoma in remission, who developed subjective fevers and malaise roughly a week ago after a family member with asymptomatic COVID visited him. He went on to develop SOB 2-3 days ago, was monitoring oxygen saturations at home, began to see readings in 80's, and [facility] reasonFormally dx with acute respiratory failure with hypoxia secondary to COIVD 19 PNA and possibly CAP, sepsis, AKI, hyperbilirubinema.      PT Comments    Patient resting in ICU and RN cleared for him to be treated by OT/PT. Co treat for safety and secondary to his medical complexity. He has multiple lines, on HFNC, and on CRTT. Used Armed forces training and education officer for optimal interpretation as he is [employer]. Tends to mouth word, but is not audible. Can nod yes and no. Denies any pain. Requires Verbal and tactile cues for directives. Performed LE ex as outlined with brief rests- 10 reps each, AA. At end of session, bed in low position, all covers in proper alignment including warmer blanket. Bed in "chair position" and confirmed with RN to assist in acclimating into upright position. Should continue to benefit from skilled PT to further address optimal functional outcomes.  Follow Up Recommendations  CIR;SNF(depending on ability to wean from HFNC)     Equipment Recommendations  (Depends on progression while here)    Recommendations for Other Services       Precautions / Restrictions Precautions Precautions: Fall;Other (comment) Precaution Comments: CRRT - jugular access. Monitor SpO2, 35L HHFNC @100 %FiO2. Parkinson's with tremors.  Restrictions Weight Bearing Restrictions: No    Mobility  Bed Mobility Overal bed mobility: Needs Assistance Bed Mobility: Rolling Rolling:  Total assist;+2 for physical assistance;+2 for safety/equipment   Supine to sit: Min assist;HOB elevated     General bed mobility comments: Total assist x 2 to roll and scoot up in bed.   Transfers Overall transfer level: Needs assistance Equipment used: Rolling walker (2 wheeled)(did not attempt any OOB activity-- remains on CRTT.)             General transfer comment: Not attempted this date. Pt able to tolerate chair position ~30 minutes  Ambulation/Gait                 Stairs             Wheelchair Mobility    Modified Rankin (Stroke Patients Only)       Balance                                            Cognition Arousal/Alertness: Lethargic Behavior During Therapy: Flat affect Overall Cognitive Status: Impaired/Different from baseline Area of Impairment: Orientation;Attention;Memory;Following commands                 Orientation Level: Disoriented to;Place;Time;Situation Current Attention Level: Divided   Following Commands: Follows one step commands inconsistently;Follows one step commands with increased time       General Comments: Utilized interpreter for session. Pt able to nod head yes/no to answer questions. Pt lethargic throughout session requiring cues to maintain alertness. Pt following ~75% of simple one step commands.       Exercises General Exercises - Lower Extremity Ankle  Circles/Pumps: AAROM;Supine;10 reps Quad Sets: AAROM;Supine;10 reps Short Arc QuadSinclair Ship;Supine;10 reps Heel Slides: AAROM;Supine;10 reps Hip ABduction/ADduction: PROM Straight Leg Raises: AAROM;Supine;10 reps Other Exercises Other Exercises: Encouraged to try and move his lower extremities as much as possible while lying in bed. Used [employer] interpreter. Other Exercises: LUE AROM shld flex to 90 degrees x 5, elbow flex/ext x 5, and finger flex/ext x 5.     General Comments General comments (skin integrity, edema, etc.): Pt on  35L HHFNC @100 % FiO2. SpO2 dropped to mid 80s in supine when getting cleaned up. SpO2 maintained in 90s when in chair position. Pt able to tolerate chair position ~30 min. RR 20s-30s.       Pertinent Vitals/Pain Pain Assessment: No/denies pain Faces Pain Scale: No hurt    Home Living                      Prior Function            PT Goals (current goals can now be found in the care plan section) Acute Rehab PT Goals Patient Stated Goal: Could not define any goals during session. PT Goal Formulation: With patient Time For Goal Achievement: 02/10/20 Potential to Achieve Goals: Fair Progress towards PT goals: Progressing toward goals(Very slow but steadily beginning to progress. He is very frail, weak, and medically complex at this time. Multiple lines and CRTT.)    Frequency    Min 3X/week      PT Plan Current plan remains appropriate(as relates to ICU and will progress as tolerated.)    Co-evaluation PT/OT/SLP Co-Evaluation/Treatment: Yes Reason for Co-Treatment: Complexity of the patient's impairments (multi-system involvement);For patient/therapist safety PT goals addressed during session: Strengthening/ROM OT goals addressed during session: Strengthening/ROM      AM-PAC PT "6 Clicks" Mobility   Outcome Measure  Help needed turning from your back to your side while in a flat bed without using bedrails?: A Lot Help needed moving from lying on your back to sitting on the side of a flat bed without using bedrails?: A Lot Help needed moving to and from a bed to a chair (including a wheelchair)?: A Lot Help needed standing up from a chair using your arms (e.g., wheelchair or bedside chair)?: A Lot(Did not attempt any OOB activity at this time- best anticipated level is what is rated) Help needed to walk in hospital room?: A Lot Help needed climbing 3-5 steps with a railing? : A Lot 6 Click Score: 12    End of Session Equipment Utilized During Treatment:  Oxygen Activity Tolerance: Treatment limited secondary to medical complications (Comment);Patient limited by fatigue;Patient limited by lethargy Patient left: in chair;with call bell/phone within reach;with chair alarm set;with nursing/sitter in room   PT Visit Diagnosis: Unsteadiness on feet (R26.81);Other abnormalities of gait and mobility (R26.89);Muscle weakness (generalized) (M62.81)     Time: 0135-0225(Total time for C0-Treat) PT Time Calculation (min) (ACUTE ONLY): 50 min  Charges:  $Therapeutic Exercise: 8-22 mins                   Rollen Sox, PT # [phone] CGV cell   Casandra Doffing 01/28/2020, 4:06 PM

## 2020-01-29 ENCOUNTER — Other Ambulatory Visit: Payer: Self-pay

## 2020-01-29 DIAGNOSIS — Z7189 Other specified counseling: Secondary | ICD-10-CM

## 2020-01-29 DIAGNOSIS — E43 Unspecified severe protein-calorie malnutrition: Secondary | ICD-10-CM | POA: Insufficient documentation

## 2020-01-29 LAB — PROTIME-INR
INR: 1.7 — ABNORMAL HIGH (ref 0.8–1.2)
Prothrombin Time: 19.6 seconds — ABNORMAL HIGH (ref 11.4–15.2)

## 2020-01-29 LAB — RENAL FUNCTION PANEL
Albumin: 2.6 g/dL — ABNORMAL LOW (ref 3.5–5.0)
Albumin: 2.3 g/dL — ABNORMAL LOW (ref 3.5–5.0)
Anion gap: 20 — ABNORMAL HIGH (ref 5–15)
Anion gap: 23 — ABNORMAL HIGH (ref 5–15)
BUN: 40 mg/dL — ABNORMAL HIGH (ref 8–23)
BUN: 70 mg/dL — ABNORMAL HIGH (ref 8–23)
CO2: 16 mmol/L — ABNORMAL LOW (ref 22–32)
CO2: 16 mmol/L — ABNORMAL LOW (ref 22–32)
Calcium: 7.5 mg/dL — ABNORMAL LOW (ref 8.9–10.3)
Calcium: 6.9 mg/dL — ABNORMAL LOW (ref 8.9–10.3)
Chloride: 102 mmol/L (ref 98–111)
Chloride: 99 mmol/L (ref 98–111)
Creatinine, Ser: 1.5 mg/dL — ABNORMAL HIGH (ref 0.61–1.24)
Creatinine, Ser: 2.94 mg/dL — ABNORMAL HIGH (ref 0.61–1.24)
GFR calc Af Amer: 52 mL/min — ABNORMAL LOW (ref >60)
GFR calc Af Amer: 23 mL/min — ABNORMAL LOW (ref >60)
GFR calc non Af Amer: 45 mL/min — ABNORMAL LOW (ref >60)
GFR calc non Af Amer: 20 mL/min — ABNORMAL LOW (ref >60)
Glucose, Bld: 115 mg/dL — ABNORMAL HIGH (ref 70–99)
Glucose, Bld: 126 mg/dL — ABNORMAL HIGH (ref 70–99)
Phosphorus: 5.6 mg/dL — ABNORMAL HIGH (ref 2.5–4.6)
Phosphorus: 10 mg/dL — ABNORMAL HIGH (ref 2.5–4.6)
Potassium: 4.3 mmol/L (ref 3.5–5.1)
Potassium: 4.6 mmol/L (ref 3.5–5.1)
Sodium: 138 mmol/L (ref 135–145)
Sodium: 138 mmol/L (ref 135–145)

## 2020-01-29 LAB — CBC WITH DIFFERENTIAL/PLATELET
Abs Immature Granulocytes: 2.62 10*3/uL — ABNORMAL HIGH (ref 0.00–0.07)
Basophils Absolute: 0 10*3/uL (ref 0.0–0.1)
Basophils Relative: 0 %
Eosinophils Absolute: 0 10*3/uL (ref 0.0–0.5)
Eosinophils Relative: 0 %
HCT: 43.7 % (ref 39.0–52.0)
Hemoglobin: 14.3 g/dL (ref 13.0–17.0)
Immature Granulocytes: 5 %
Lymphocytes Relative: 1 %
Lymphs Abs: 0.5 10*3/uL — ABNORMAL LOW (ref 0.7–4.0)
MCH: 32.2 pg (ref 26.0–34.0)
MCHC: 32.7 g/dL (ref 30.0–36.0)
MCV: 98.4 fL (ref 80.0–100.0)
Monocytes Absolute: 1.3 10*3/uL — ABNORMAL HIGH (ref 0.1–1.0)
Monocytes Relative: 2 %
Neutro Abs: 49.4 10*3/uL — ABNORMAL HIGH (ref 1.7–7.7)
Neutrophils Relative %: 92 %
Platelets: 144 10*3/uL — ABNORMAL LOW (ref 150–400)
RBC: 4.44 MIL/uL (ref 4.22–5.81)
RDW: 17.7 % — ABNORMAL HIGH (ref 11.5–15.5)
WBC: 53.8 10*3/uL (ref 4.0–10.5)
nRBC: 4.8 % — ABNORMAL HIGH (ref 0.0–0.2)

## 2020-01-29 LAB — GLUCOSE, CAPILLARY
Glucose-Capillary: 109 mg/dL — ABNORMAL HIGH (ref 70–99)
Glucose-Capillary: 124 mg/dL — ABNORMAL HIGH (ref 70–99)
Glucose-Capillary: 129 mg/dL — ABNORMAL HIGH (ref 70–99)
Glucose-Capillary: 148 mg/dL — ABNORMAL HIGH (ref 70–99)
Glucose-Capillary: 170 mg/dL — ABNORMAL HIGH (ref 70–99)
Glucose-Capillary: 96 mg/dL (ref 70–99)

## 2020-01-29 LAB — PHOSPHORUS: Phosphorus: 5.5 mg/dL — ABNORMAL HIGH (ref 2.5–4.6)

## 2020-01-29 LAB — C-REACTIVE PROTEIN: CRP: 3.8 mg/dL — ABNORMAL HIGH (ref <1.0)

## 2020-01-29 LAB — HEPATIC FUNCTION PANEL
ALT: 75 U/L — ABNORMAL HIGH (ref 0–44)
AST: 1899 U/L — ABNORMAL HIGH (ref 15–41)
Albumin: 2.6 g/dL — ABNORMAL LOW (ref 3.5–5.0)
Alkaline Phosphatase: 409 U/L — ABNORMAL HIGH (ref 38–126)
Bilirubin, Direct: 4.5 mg/dL — ABNORMAL HIGH (ref 0.0–0.2)
Indirect Bilirubin: 2.5 mg/dL — ABNORMAL HIGH (ref 0.3–0.9)
Total Bilirubin: 7 mg/dL — ABNORMAL HIGH (ref 0.3–1.2)
Total Protein: 5.4 g/dL — ABNORMAL LOW (ref 6.5–8.1)

## 2020-01-29 LAB — HEPATITIS PANEL, ACUTE
HCV Ab: <0.1 s/co ratio — AB (ref 0.0–0.9)
Hep A IgM: NEGATIVE — AB
Hep B C IgM: NEGATIVE — AB
Hepatitis B Surface Ag: NEGATIVE — AB

## 2020-01-29 LAB — MAGNESIUM
Magnesium: 2.8 mg/dL — ABNORMAL HIGH (ref 1.7–2.4)
Magnesium: 2.9 mg/dL — ABNORMAL HIGH (ref 1.7–2.4)

## 2020-01-29 LAB — HEMOGLOBIN AND HEMATOCRIT, BLOOD
HCT: 43.2 % (ref 39.0–52.0)
HCT: 42.6 % (ref 39.0–52.0)
Hemoglobin: 14.2 g/dL (ref 13.0–17.0)
Hemoglobin: 13.9 g/dL (ref 13.0–17.0)

## 2020-01-29 LAB — FERRITIN: Ferritin: >7500 ng/mL — ABNORMAL HIGH (ref 24–336)

## 2020-01-29 LAB — HEPARIN LEVEL (UNFRACTIONATED)
Heparin Unfractionated: 0.32 IU/mL (ref 0.30–0.70)
Heparin Unfractionated: 0.3 IU/mL (ref 0.30–0.70)

## 2020-01-29 LAB — AMMONIA: Ammonia: 55 umol/L — ABNORMAL HIGH (ref 9–35)

## 2020-01-29 LAB — D-DIMER, QUANTITATIVE: D-Dimer, Quant: 9.64 ug/mL-FEU — ABNORMAL HIGH (ref 0.00–0.50)

## 2020-01-29 MED ORDER — SODIUM BICARBONATE 8.4 % IV SOLN
INTRAVENOUS | Status: AC
  Administered 2020-01-29: 01:00:00 100 meq via INTRAVENOUS
  Filled 2020-01-29: qty 100

## 2020-01-29 MED ORDER — SODIUM BICARBONATE 8.4 % IV SOLN: 100.0000 meq | Freq: Once | INTRAVENOUS | Status: AC

## 2020-01-29 MED ORDER — "THROMBI-PAD 3""X3"" EX PADS"
1.0000 | MEDICATED_PAD | Freq: Once | CUTANEOUS | Status: DC
  Filled 2020-01-29: qty 1

## 2020-01-29 MED ORDER — NOREPINEPHRINE 16 MG/250ML-% IV SOLN
0.0000 ug/min | INTRAVENOUS | Status: DC
  Administered 2020-01-29: 05:00:00 16 ug/min via INTRAVENOUS
  Administered 2020-01-29: 22:00:00 40 ug/min via INTRAVENOUS
  Filled 2020-01-29 (×2): qty 250

## 2020-02-03 NOTE — Progress Notes (Signed)
Unable to obtain arterial blood gas.

## 2020-02-03 NOTE — Procedures (Signed)
Cortrak  Person Inserting Tube:  Maylon Peppers C, RD Tube Type:  Cortrak - 43 inches Tube Location:  Left nare Initial Placement:  Stomach Secured by: Bridle Technique Used to Measure Tube Placement:  Documented cm marking at nare/ corner of mouth Cortrak Secured At:  77 cm    Cortrak Tube Team Note:  Consult received to place a Cortrak feeding tube.   No x-ray is required. RN may begin using tube.   If the tube becomes dislodged please keep the tube and contact the Cortrak team at [email or web address] (password [email or web address]) for replacement.  If after hours and replacement cannot be delayed, place a NG tube and confirm placement with an abdominal x-ray.    Lockie Pares., RD, LDN, CNSC See AMiON for contact information

## 2020-02-03 NOTE — Progress Notes (Addendum)
Palliative:  HPI: 74 y.o. male  with past medical history of Parkinsons and non-hodkins lymphoma (in remission) who was admitted on 01/16/2020 with multifocal pneumonia and acute renal failure secondary to COVID 19. Reportedly the patient had been battling symptoms at home for about a week.  He had to come to the hospital as he had become hypoxic with oxygen saturations in the high 70s on room air. Hospital course complicated by continued high oxygen requirements, renal failure requiring CRRT, liver failure, and frequent ectopy that precludes use of CRRT currently. Overall poor prognosis.   I met today at Christopher Travis' bedside. RN provides update at bedside. CRRT is currently on hold with concerns for tolerating CRRT. He does arouse to voice and does nod head no to asking if he has pain (in [place]). He appears very weak and fatigued. He has multi-organ failure and overall failure to thrive.   I returned call to daughter, Christopher Travis. My colleague has previously had conversations with Christopher Travis. Christopher Travis is very upset and is very concerned that all measures are not being provided to give her father opportunity to improve. She has many concerns and would like for a second opinion (she is not requesting transfer). I provided listening to Christopher Travis and acknowledgement that she just wants to feel reassured that her father is provided good care and opportunity. We did discuss that he continues to be critically ill and could decline towards death at any time even with the best of care.   I have discussed concerns with Dr. Avon Gully. I discussed with Dr. Lake Bells who will review and make recommendations. I have also reached out to nephrology for input. I have informed Christopher Travis of these plans and that I will touch base with tomorrow after input and recommendations from medical team.   All questions/concerns addressed. Emotional support provided.   Exam: Arousable. Fatigued. HR regular rate. Breathing labored, tachypnea,  accessory muscle use. Abd soft. Generalized weakness.   Plan: - Dr. Lake Bells to weigh in on second opinion per daughter wishes.  - Prognosis remains poor and high risk for death which I shared with Christopher Travis.  - Family request continued aggressive interventions to prolong life.   [place], NP Palliative Medicine Team Pager [phone] (Please see [email or web address] for schedule) Team Phone [phone]    Greater than 50%  of this time was spent counseling and coordinating care related to the above assessment and plan

## 2020-02-03 NOTE — Progress Notes (Signed)
[facility] PCCM Evening Rounds Chief Medical Officer, [facility]  On evening rounds this afternoon I took time to visit with Christopher Travis and talk to his nurses about his care.  I have also spoken with our team from palliative care and I have spoken with the primary service attending Dr. Avon Gully.  In short, Christopher Travis is dying.  He has multiorgan failure with liver failure, kidney failure, cardiogenic failure, and lung failure.  In addition to this he is profoundly weak, does not want to eat.  I talked to him today about his wishes he tells me that he is not in any pain but he is short of breath.  I explained to him that there is nothing more that I can do at this point which will change the inevitable outcome of his death.  He turned to me and acknowledged this with a week of his eye and nodded his head appropriately.  Throughout his ICU stay he has been in and out of delirium.  There is truly nothing more that we can do from a medical standpoint which will prevent his death.  Given his frail state and his shortness of breath at this point I strongly recommend full comfort measures because he is clearly suffering.  It is my understanding that his family has unrealistic expectations for the potential for Western medicine to cure his ailments.  There they may not understand despite many lengthy conversations from multiple service lines it is not appropriate for [place] to perpetuate this gentleman suffering.  There is no other treatment option that could be offered at another center that would change the outcome.  We need to focus on his comfort only at this point.  Roselie Awkward, MD [place] P[facility]M Pager: [phone] Cell: [phone] If no response, call [phone]

## 2020-02-03 NOTE — Progress Notes (Signed)
ANTICOAGULATION CONSULT NOTE - Follow Up Consult  Pharmacy Consult for Heparin IV  Indication: DVT  Assessment: 54 YOM with COVID-19 pneumonia. Dopplers revealed an acute L-DVT and R-superficial thrombosis. Pharmacy consulted for Lovenox on 2/16, but patient now with worsening renal function.  Pharmacy is consulted to transition to IV Heparin.  Heparin level 0.32, therapeutic on heparin at 850 units/hr.  Goal of Therapy:  Heparin level 0.3-0.5 units/ml Monitor platelets by anticoagulation protocol: Yes   Plan:  Continue heparin IV infusion at 850 units/hr Will recheck heparin level later today to confirm Daily heparin level and CBC Monitor for signs and symptoms of bleeding  Thanks for allowing pharmacy to be a part of this patient's care.  Excell Seltzer, PharmD Clinical Pharmacist 02/12/2020 5:50 AM

## 2020-02-03 NOTE — Progress Notes (Signed)
ANTICOAGULATION CONSULT NOTE - Follow Up Consult  Pharmacy Consult for Heparin IV  Indication: DVT  Allergies  Allergen Reactions  . Iodine Anaphylaxis and Swelling    Throat swelling per daughter    Patient Measurements: Height: 5\' 11"  (180.3 cm) Weight: 174 lb 9.7 oz (79.2 kg) IBW/kg (Calculated) : 75.3 Heparin Dosing Weight: actual weight  Vital Signs: Temp: 98.6 F (37 C) (02/24 0700) Temp Source: Axillary (02/24 0700) BP: 149/76 (02/24 0850) Pulse Rate: 99 (02/24 0850)  Labs: Recent Labs    01/27/20 0440 01/27/20 1426 01/27/20 2320 01/28/20 0400 01/28/20 0400 01/28/20 1026 01/28/20 1256 01/28/20 1256 01/28/20 1656 01/28/20 1830 2020/02/27 0050 02-27-2020 0800  HGB 15.6  --   --  15.0   < >  --  14.2   < >  --   --  14.3 14.2  HCT 46.0  --   --  45.5   < >  --  42.8  --   --   --  43.7 43.2  PLT 191  --   --  140*  --   --   --   --   --   --  144*  --   LABPROT 16.5*  --   --  18.4*  --   --   --   --   --   --  19.6*  --   INR 1.3*  --   --  1.5*  --   --   --   --   --   --  1.7*  --   HEPARINUNFRC 0.79*   < >   < >  --   --  0.42  --   --   --  0.30 0.32  --   CREATININE 2.02*   < >  --  1.74*  1.72*  --   --   --   --  1.68*  --  1.50*  --    < > = values in this interval not displayed.    Estimated Creatinine Clearance: 45.3 mL/min (A) (by C-G formula based on SCr of 1.5 mg/dL (H)).   Medications:  Infusions:  .  prismasol BGK 4/2.5 500 mL/hr at 01/28/20 1540  .  prismasol BGK 4/2.5 200 mL/hr at 01/28/20 2025  . sodium chloride    . ceFEPime (MAXIPIME) IV 2 g (2020/02/27 0750)  . dexmedetomidine (PRECEDEX) IV infusion Stopped (01/27/20 0523)  . dextrose 50 mL/hr at 2020/02/27 0600  . feeding supplement (PIVOT 1.5 CAL) 1,000 mL (02-27-20 1043)  . heparin 850 Units/hr (02/27/2020 0600)  . norepinephrine (LEVOPHED) Adult infusion 16 mcg/min (02-27-2020 0600)  . prismasol BGK 2/2.5 dialysis solution 2,000 mL/hr at 01/28/20 2301    Assessment: 44 YOM with  COVID-19 pneumonia. Dopplers revealed an acute L-DVT and R-superficial thrombosis. Pharmacy consulted for Lovenox on 2/16, but patient now with worsening renal function.  Pharmacy is consulted to transition to IV Heparin.  Heparin level 0.3, remains therapeutic on heparin at 850 units/hr. CBC: H/H decreased to 14.3, Plt low/stable at 144k RN reports on 2/22 that the patient had a dark BM, with FOB positive.  RN notified MD at that time.  No further bleeding on 2/23, no stool on 2/24.  CVC site is oozing - thrombipad applied, no further bleeding or complications reported. D-dimer: down to 9.6 CRRT - currently on hold, not restarted after family visit on 2/23 PM.  No filter clotting reported.  Goal of Therapy:  Heparin level 0.3-0.5 units/ml  Monitor platelets by anticoagulation protocol: Yes   Plan:  Continue heparin IV infusion at 850 units/hr Confirmatory Heparin level in 8 hours  Daily heparin level and CBC   Gretta Arab PharmD, BCPS Clinical pharmacist phone 7am- 5pm: [phone] Feb 21, 2020 10:48 AM

## 2020-02-03 NOTE — Progress Notes (Signed)
PT Cancellation Note  Patient Details Name: Christopher Travis MRN: [id number] DOB: August 27, 1946   Cancelled Treatment:    Reason Eval/Treat Not Completed: Medical issues which prohibited therapy. Attempted to see pt this pm but pt is not medically able to participate in skilled PT Tx at this time.   Horald Chestnut, PT    Delford Field 02/24/20, 2:42 PM

## 2020-02-03 NOTE — Progress Notes (Signed)
Patient death was pronounced at 03-22-41 with myself and Britta Mccreedy, RN. Patient's daughter [name] was notified. [name] stated they did not have a funeral home planned and was told that the health system would be in contact with her to further discuss the patient's arrangements. Patient was sent to the morgue with all belongings in the room. The patient did not have any belongings with security or home medications with the pharmacy.

## 2020-02-03 NOTE — Progress Notes (Signed)
0030: Pt starting having ectopy on EKG. Multiple PAC's, missed beats, and PVC's. Increase demand for levophed due to hypotension. Decreased pt removal rate to 0 on CRRT. Elink called new orders received. 0100: Pt went bradycardic into the 40's. Elink called and received an order of sodium bicarb. Pt's HR  Increased after  Medication given.  0120: Family called with update. All questions answered. [name] and family made decision to come visit pt.  0240: Transported pt to visiting room on [address] [place] and NRB with 3 RNs. 0300: Pts family arrived and visited for 30 mins. Pt's vitals remained stable through the visit.  0330: Pt transported back to ICU with out issue. Placed back on high flow [place] and resting comfortably.

## 2020-02-03 NOTE — Death Summary Note (Signed)
DEATH SUMMARY   Patient Details  Name: Christopher Travis MRN: [id number] DOB: 24-Dec-1945  Admission/Discharge Information   Admit Date:  2020/02/11  Date of Death: Date of Death: 2020/02/23  Time of Death: Time of Death: 03/23/41  Length of Stay: 10-Mar-2023  Referring Physician: System, Pcp Not In   Reason(s) for Hospitalization  Acute hypoxic respiratory failure in the setting of COVID-19 pneumonia.  Concurrent septic shock.  Renal failure, liver failure.  Diagnoses  Preliminary cause of death:  Secondary Diagnoses (including complications and co-morbidities):  Principal Problem:   Pneumonia due to COVID-19 virus Active Problems:   Parkinson disease ([place])   Hypertension   AKI (acute kidney injury) ([place])   Severe sepsis ([place])   Hyperbilirubinemia   Acute respiratory failure with hypoxia ([place])   Parkinson's disease ([place])   Palliative care encounter   HCAP (healthcare-associated pneumonia)   Leukocytosis   Upper GI bleed   Thrush of mouth and esophagus ([place])   Acute renal failure (ARF) ([place])   Liver failure ([place])   Protein-calorie malnutrition, severe   Brief Hospital Course (including significant findings, care, treatment, and services provided and events leading to death)  Christopher Travis is a 74 y.o. year old male who initially presented with fever hypoxia dehydration and shortness of breath noted to have COVID-19 pneumonia with marked hypoxia.  Bilateral x-ray initially showed moderate to severe disease, patient's respiratory status continued to worsen, fortunately due to severity of illness patient also likely had concurrent bacterial infection ultimately meeting criteria for septic shock on Levophed for 48 hours prior to death.  Patient was critically ill for majority of his hospitalization, so much so that family had been called at least twice to come to bedside to visit for presumed imminent death.  Patient in the setting of renal failure was placed on CRRT but had episodes of tachyarrhythmia and  was subsequently discontinued.  Patient's liver enzymes continue to worsen, mental status continued to worsen and while his hypoxia was stabilizing he was markedly hypoxic on heated high flow and nonrebreather O2 sats in the 80s.  He discussion with family daily about patient's grim prognosis.  Patient had lengthy discussion with myself and ICU physician on February 23, 2020 about goals of care, he did not want to escalate care and thus comfort measures and pain medication to alleviate his pain, air hunger and respiratory distress.  Family discussed case with palliative care but continue to want aggressive care despite patient's clinically worsening status with maximum medical therapy and support.  Overnight patient died at 22:42 on 02-23-2020.    Pertinent Labs and Studies  Significant Diagnostic Studies DG Abd 1 View  Result Date: 01/24/2020 CLINICAL DATA:  Encounter for nasogastric tube placement. EXAM: ABDOMEN - 1 VIEW COMPARISON:  Subsequent chest radiograph FINDINGS: No enteric tube is visualized in the abdomen. The tube is coiled in the esophagus on concurrent chest radiograph. Air throughout nondilated bowel loops in the central abdomen. No evidence of obstruction. IMPRESSION: No enteric tube visualized in the abdomen. The tube is coiled in the esophagus on subsequent chest radiograph. Electronically Signed   By: Keith Rake M.D.   On: 01/24/2020 23:24   CT HEAD WO CONTRAST  Result Date: 01/22/2020 CLINICAL DATA:  74 year old male with altered mental status and unresponsiveness. EXAM: CT HEAD WITHOUT CONTRAST TECHNIQUE: Contiguous axial images were obtained from the base of the skull through the vertex without intravenous contrast. COMPARISON:  None. FINDINGS: Evaluation of this exam is limited due to motion artifact. Brain: Mild  age-related atrophy and chronic microvascular ischemic changes. There is no acute intracranial hemorrhage. No mass effect or midline shift. No extra-axial fluid collection.  Vascular: No hyperdense vessel or unexpected calcification. Skull: Normal. Negative for fracture or focal lesion. Sinuses/Orbits: No acute finding. Other: None IMPRESSION: No acute intracranial pathology. Electronically Signed   By: Anner Crete M.D.   On: 01/22/2020 19:28   US RENAL  Result Date: 01/23/2020 CLINICAL DATA:  Acute renal failure. EXAM: RENAL / URINARY TRACT ULTRASOUND COMPLETE COMPARISON:  No prior. FINDINGS: Right Kidney: Renal measurements: 12.1 x 6.5 x 4.8 cm = volume: 196 mL . Echogenicity within normal limits. No mass or hydronephrosis visualized. Left Kidney: Renal measurements: 11.5 x 4.8 x 6.7 cm = volume: 194 mL. Echogenicity within normal limits. 4 cm simple cyst. No hydronephrosis visualized. Bladder: Appears normal for degree of bladder distention. Other: None. IMPRESSION: No acute or focal abnormality identified. No hydronephrosis. 4 cm simple cyst left kidney. Electronically Signed   By: Marcello Moores  Register   On: 01/23/2020 10:39   DG CHEST PORT 1 VIEW  Result Date: 01/28/2020 CLINICAL DATA:  Respiratory failure EXAM: PORTABLE CHEST 1 VIEW COMPARISON:  01/25/2020 FINDINGS: Left IJ central line is unchanged. Persistent interstitial opacities with improving lung aeration. No pleural effusion or pneumothorax. Stable cardiomediastinal contours. IMPRESSION: Persistent interstitial opacities with improving aeration. Electronically Signed   By: Macy Mis M.D.   On: 01/28/2020 09:17   DG Chest Port 1 View  Result Date: 01/25/2020 CLINICAL DATA:  74 year old male status post central line placement. EXAM: PORTABLE CHEST 1 VIEW COMPARISON:  Chest x-ray 01/24/2020. FINDINGS: Previously noted enteric tube has been removed. Left internal jugular Vas-Cath with tip terminating at the superior cavoatrial junction. Lung volumes are low. Widespread ill-defined opacities and areas of interstitial prominence are again noted in the lungs bilaterally (left greater than right), with similar  aeration to the prior study. No definite pleural effusions. No pneumothorax. No evidence of pulmonary edema. Heart size is normal. The patient is rotated to the left on today's exam, resulting in distortion of the mediastinal contours and reduced diagnostic sensitivity and specificity for mediastinal pathology. IMPRESSION: 1. Support apparatus, as above. 2. Multilobar bilateral pneumonia with similar aeration to the prior study, as above. Electronically Signed   By: Vinnie Langton M.D.   On: 01/25/2020 13:13   DG CHEST PORT 1 VIEW  Result Date: 01/24/2020 CLINICAL DATA:  Pneumonia. Orogastric tube placement. EXAM: PORTABLE CHEST 1 VIEW COMPARISON:  Chest radiograph 01/21/2019 FINDINGS: The enteric tube is coiled in the mid upper esophagus, tip not included in the field of view. Low lung volumes with diffuse heterogeneous bilateral lung opacities, not significantly changed. Unchanged heart size and mediastinal contours. No pneumothorax or large pleural effusion. IMPRESSION: 1. Enteric tube coiled in the mid upper esophagus, tip not included in the field of view. Recommend repositioning. 2. Unchanged diffuse heterogeneous bilateral lung opacities consistent with COVID pneumonia Electronically Signed   By: Keith Rake M.D.   On: 01/24/2020 23:29   DG CHEST PORT 1 VIEW  Result Date: 01/22/2020 CLINICAL DATA:  Short of breath EXAM: PORTABLE CHEST 1 VIEW COMPARISON:  01/09/2020 FINDINGS: Low lung volumes. Interval worsening of left greater than right diffuse interstitial and ground-glass opacity. No pleural effusion. Stable cardiomediastinal silhouette. No pneumothorax. IMPRESSION: Low lung volume with worsening of left greater than right interstitial and ground-glass opacities concerning for bilateral pneumonia. Electronically Signed   By: Donavan Foil M.D.   On: 01/22/2020 20:02  DG Chest Portable 1 View  Result Date: 01/28/2020 CLINICAL DATA:  Shortness of breath. COVID exposure at home. EXAM:  PORTABLE CHEST 1 VIEW COMPARISON:  None. FINDINGS: Heterogeneous patchy bilateral airspace opacities, left greater than right. Heart is normal in size with normal mediastinal contours. No pleural fluid or pneumothorax. No acute osseous abnormalities are seen. IMPRESSION: Heterogeneous patchy bilateral airspace opacities, left greater than right. Findings suspicious for multifocal pneumonia, pattern typical of COVID pneumonia. Electronically Signed   By: Keith Rake M.D.   On: 01/31/2020 20:13   ECHOCARDIOGRAM COMPLETE  Result Date: 01/23/2020    ECHOCARDIOGRAM REPORT   Patient Name:   ESIAS MCTIER Date of Exam: 01/23/2020 Medical Rec #:  [id number]  Height:       71.0 in Accession #:    [id number] Weight:       189.4 lb Date of Birth:  Aug 07, 1946   BSA:          2.06 m Patient Age:    38 years   BP:           120/75 mmHg Patient Gender: M          HR:           74 bpm. Exam Location:  Inpatient Procedure: 2D Echo, Cardiac Doppler and Color Doppler Indications:    Sick sinus syndrome.; R94.31 Abnormal EKG; R06.02 SOB  History:        Patient has no prior history of Echocardiogram examinations.                 Signs/Symptoms:Dyspnea, Shortness of Breath and Bacteremia; Risk                 Factors:Hypertension. Covid 19 positive. Pneumoina. Respiratory                 failure.  Sonographer:    Roseanna Rainbow RDCS Referring Phys: [id number] [place]  Sonographer Comments: Technically difficult study due to poor echo windows. Image acquisition challenging due to respiratory motion. IMPRESSIONS  1. Left ventricular ejection fraction, by estimation, is 60 to 65%. The left ventricle has normal function. The left ventricle has no regional wall motion abnormalities. There is mild left ventricular hypertrophy. Left ventricular diastolic parameters are consistent with Grade I diastolic dysfunction (impaired relaxation).  2. Right ventricular systolic function is normal. The right ventricular size is normal. There is mildly  elevated pulmonary artery systolic pressure. The estimated right ventricular systolic pressure is [id number] mmHg.  3. The mitral valve is normal in structure and function. Trivial mitral valve regurgitation. No evidence of mitral stenosis.  4. The aortic valve is tricuspid. Aortic valve regurgitation is not visualized. No aortic stenosis is present.  5. The inferior vena cava is dilated in size with >50% respiratory variability, suggesting right atrial pressure of 8 mmHg. FINDINGS  Left Ventricle: Left ventricular ejection fraction, by estimation, is 60 to 65%. The left ventricle has normal function. The left ventricle has no regional wall motion abnormalities. The left ventricular internal cavity size was normal in size. There is  mild left ventricular hypertrophy. Left ventricular diastolic parameters are consistent with Grade I diastolic dysfunction (impaired relaxation). Right Ventricle: The right ventricular size is normal. No increase in right ventricular wall thickness. Right ventricular systolic function is normal. There is mildly elevated pulmonary artery systolic pressure. The tricuspid regurgitant velocity is 2.59  m/s, and with an assumed right atrial pressure of 8 mmHg, the estimated right ventricular systolic pressure is 34.8  mmHg. Left Atrium: Left atrial size was normal in size. Right Atrium: Right atrial size was normal in size. Pericardium: There is no evidence of pericardial effusion. Mitral Valve: The mitral valve is normal in structure and function. Trivial mitral valve regurgitation. No evidence of mitral valve stenosis. Tricuspid Valve: The tricuspid valve is normal in structure. Tricuspid valve regurgitation is trivial. Aortic Valve: The aortic valve is tricuspid. Aortic valve regurgitation is not visualized. No aortic stenosis is present. Pulmonic Valve: The pulmonic valve was normal in structure. Pulmonic valve regurgitation is not visualized. Aorta: The aortic root is normal in size and  structure. Venous: The inferior vena cava is dilated in size with greater than 50% respiratory variability, suggesting right atrial pressure of 8 mmHg. IAS/Shunts: No atrial level shunt detected by color flow Doppler.  LEFT VENTRICLE PLAX 2D LVIDd:         3.97 cm     Diastology LVIDs:         2.71 cm     LV e' lateral:   5.77 cm/s LV PW:         1.50 cm     LV E/e' lateral: 10.5 LV IVS:        1.10 cm     LV e' medial:    6.53 cm/s LVOT diam:     1.70 cm     LV E/e' medial:  9.3 LV SV:         51.98 ml LV SV Index:   19.89 LVOT Area:     2.27 cm  LV Volumes (MOD) LV vol d, MOD A2C: 68.4 ml LV vol d, MOD A4C: 52.4 ml LV vol s, MOD A2C: 17.9 ml LV vol s, MOD A4C: 19.3 ml LV SV MOD A2C:     50.5 ml LV SV MOD A4C:     52.4 ml LV SV MOD BP:      44.5 ml RIGHT VENTRICLE            IVC RV S prime:     9.25 cm/s  IVC diam: 2.29 cm TAPSE (M-mode): 1.3 cm LEFT ATRIUM             Index       RIGHT ATRIUM           Index LA diam:        4.00 cm 1.94 cm/m  RA Area:     12.80 cm LA Vol (A2C):   39.2 ml 19.03 ml/m RA Volume:   28.90 ml  14.03 ml/m LA Vol (A4C):   23.1 ml 11.21 ml/m LA Biplane Vol: 33.4 ml 16.21 ml/m  AORTIC VALVE LVOT Vmax:   108.00 cm/s LVOT Vmean:  69.100 cm/s LVOT VTI:    0.229 m  AORTA Ao Root diam: 3.60 cm Ao Asc diam:  3.30 cm MITRAL VALVE               TRICUSPID VALVE MV Area (PHT): 2.50 cm    TR Peak grad:   26.8 mmHg MV Decel Time: 303 msec    TR Vmax:        259.00 cm/s MV E velocity: 60.60 cm/s MV A velocity: 84.40 cm/s  SHUNTS MV E/A ratio:  0.72        Systemic VTI:  0.23 m                            Systemic Diam: 1.70 cm [employer]  MD Electronically signed by Loralie Champagne MD Signature Date/Time: 01/23/2020/10:57:05 AM    Final    VAS [place] LOWER EXTREMITY VENOUS (DVT)  Result Date: 01/21/2020  Lower Venous DVTStudy Indications: Elevated D dimer.  Performing Technologist: Antonieta Pert RDMS, RVT  Examination Guidelines: A complete evaluation includes B-mode imaging, spectral Doppler,  color Doppler, and power Doppler as needed of all accessible portions of each vessel. Bilateral testing is considered an integral part of a complete examination. Limited examinations for reoccurring indications may be performed as noted. The reflux portion of the exam is performed with the patient in reverse Trendelenburg.  +-------------+---------------+---------+-----------+-------------+------------+ RIGHT        CompressibilityPhasicitySpontaneityProperties   Thrombus                                                                  Aging        +-------------+---------------+---------+-----------+-------------+------------+ CFV          Full           Yes      Yes                                  +-------------+---------------+---------+-----------+-------------+------------+ SFJ          Full                                                         +-------------+---------------+---------+-----------+-------------+------------+ FV Prox      Full                                                         +-------------+---------------+---------+-----------+-------------+------------+ FV Mid       Full                                                         +-------------+---------------+---------+-----------+-------------+------------+ FV Distal    Full                                                         +-------------+---------------+---------+-----------+-------------+------------+ PFV          Full                                                         +-------------+---------------+---------+-----------+-------------+------------+ POP          Full  Yes      Yes                                  +-------------+---------------+---------+-----------+-------------+------------+ PTV          Full                                                         +-------------+---------------+---------+-----------+-------------+------------+  PERO         Full                                                         +-------------+---------------+---------+-----------+-------------+------------+ GSV          Partial                                                      +-------------+---------------+---------+-----------+-------------+------------+ varicosititesNone                               softly       Acute                                                        echogenic                 +-------------+---------------+---------+-----------+-------------+------------+   +---------+---------------+---------+-----------+----------+--------------+ LEFT     CompressibilityPhasicitySpontaneityPropertiesThrombus Aging +---------+---------------+---------+-----------+----------+--------------+ CFV      Full           Yes      Yes                                 +---------+---------------+---------+-----------+----------+--------------+ SFJ      Full                                                        +---------+---------------+---------+-----------+----------+--------------+ FV Prox  Full                                                        +---------+---------------+---------+-----------+----------+--------------+ FV Mid   Full                                                        +---------+---------------+---------+-----------+----------+--------------+  FV DistalFull                                                        +---------+---------------+---------+-----------+----------+--------------+ PFV      Full                                                        +---------+---------------+---------+-----------+----------+--------------+ POP      Full           Yes      Yes                                 +---------+---------------+---------+-----------+----------+--------------+ PTV      Full                                                         +---------+---------------+---------+-----------+----------+--------------+ PERO     Full                                                        +---------+---------------+---------+-----------+----------+--------------+ Gastroc  Partial                                      Acute          +---------+---------------+---------+-----------+----------+--------------+ GSV      Full                                                        +---------+---------------+---------+-----------+----------+--------------+     Summary: RIGHT: - Findings consistent with acute superficial vein thrombosis involving the right great saphenous vein, and right varicosities or other superficial veins. - No cystic structure found in the popliteal fossa.  LEFT: - Findings consistent with acute deep vein thrombosis involving the left gastrocnemius veins. - No cystic structure found in the popliteal fossa.  *See table(s) above for measurements and observations. Electronically signed by Deitra Mayo MD on 01/21/2020 at 4:12:51 PM.    Final     Microbiology Recent Results (from the past 240 hour(s))  MRSA PCR Screening     Status: None   Collection Time: 01/20/20  9:40 AM   Specimen: Nasopharyngeal  Result Value Ref Range Status   MRSA by PCR NEGATIVE NEGATIVE Final    Comment:        The GeneXpert MRSA Assay (FDA approved for NASAL specimens only), is one component of a comprehensive MRSA colonization surveillance program. It is not intended to diagnose MRSA infection nor to guide or monitor treatment for MRSA infections. Performed at [facility]  [place] [address], [place], [place] [address]   Culture, blood (routine x 2)     Status: None   Collection Time: 01/22/20  7:54 PM   Specimen: BLOOD  Result Value Ref Range Status   Specimen Description BLOOD RIGHT ANTECUBITAL  Final   Special Requests   Final    BOTTLES DRAWN AEROBIC AND ANAEROBIC Blood Culture adequate volume    Culture   Final    NO GROWTH 5 DAYS Performed at [facility], [place] [address] [place], [place] [address]    Report Status 01/27/2020 FINAL  Final  Culture, blood (routine x 2)     Status: None   Collection Time: 01/22/20  8:04 PM   Specimen: BLOOD  Result Value Ref Range Status   Specimen Description BLOOD LEFT ANTECUBITAL  Final   Special Requests   Final    BOTTLES DRAWN AEROBIC ONLY Blood Culture results may not be optimal due to an inadequate volume of blood received in culture bottles   Culture   Final    NO GROWTH 5 DAYS Performed at [facility], [place] [address], [place], [place] [address]    Report Status 01/27/2020 FINAL  Final  MRSA PCR Screening     Status: None   Collection Time: 01/23/20  9:20 AM   Specimen: Nasal Mucosa; Nasopharyngeal  Result Value Ref Range Status   MRSA by PCR NEGATIVE NEGATIVE Final    Comment:        The GeneXpert MRSA Assay (FDA approved for NASAL specimens only), is one component of a comprehensive MRSA colonization surveillance program. It is not intended to diagnose MRSA infection nor to guide or monitor treatment for MRSA infections. Performed at [facility], [place] [address] [place], [place] [address]   Culture, Urine     Status: None   Collection Time: 01/24/20  4:43 PM   Specimen: Urine, Clean Catch  Result Value Ref Range Status   Specimen Description   Final    URINE, CLEAN CATCH Performed at [facility], [place] [address], [place], [place] [address]    Special Requests   Final    NONE Performed at [facility], [place] [address], [place], [place] [address]    Culture   Final    NO GROWTH Performed at [facility], [place] [address], [place], [place] [address]    Report Status 01/25/2020 FINAL  Final  MRSA PCR Screening     Status: None   Collection Time: 01/27/20  6:30 PM   Specimen: Nasopharyngeal Wash  Result Value Ref Range Status   MRSA by PCR  NEGATIVE NEGATIVE Final    Comment:        The GeneXpert MRSA Assay (FDA approved for NASAL specimens only), is one component of a comprehensive MRSA colonization surveillance program. It is not intended to diagnose MRSA infection nor to guide or monitor treatment for MRSA infections. Performed at [facility], [place] [address] [place],  [address]     Lab Basic Metabolic Panel: Recent Labs  Lab 01/27/20 0440 01/27/20 0440 01/27/20 1535 01/27/20 1535 01/27/20 1830 01/28/20 0400 01/28/20 1656 2020-01-30 0050 Jan 30, 2020 1629  NA 142   < > 140   < > 140 138  136 137 138 138  K 5.0   < > 6.2*   < > 5.8* 4.5  4.2 4.6 4.3 4.6  CL 104   < > 105   < >  104 100  99 99 102 99  CO2 18*   < > 18*   < > 17* 21*  20* 18* 16* 16*  GLUCOSE 156*   < > 215*   < > 179* 180*  178* 141* 115* 126*  BUN 83*   < > 71*   < > 69* 59*  58* 46* 40* 70*  CREATININE 2.02*   < > 2.16*   < > 2.05* 1.74*  1.72* 1.68* 1.50* 2.94*  CALCIUM 8.2*   < > 8.1*   < > 8.0* 8.1*  8.2* 7.5* 7.5* 6.9*  MG 2.8*  --   --   --  3.3* 2.8*  --  2.8* 2.9*  PHOS 4.7*   < > 5.3*  --   --  4.5  4.6 5.3* 5.6*  5.5* 10.0*   < > = values in this interval not displayed.   Liver Function Tests: Recent Labs  Lab 01/25/20 0546 01/25/20 1615 01/26/20 0525 01/26/20 1620 01/27/20 0440 01/27/20 0440 01/27/20 1535 01/28/20 0400 01/28/20 1656 2020-02-10 0050 02/10/20 1629  AST 92*  --  100*  --  154*  --   --  606*  --  1,899*  --   ALT 15  --  14  --  20  --   --  29  --  75*  --   ALKPHOS 239*  --  225*  --  293*  --   --  317*  --  409*  --   BILITOT 3.3*  --  3.9*  --  5.4*  --   --  5.7*  --  7.0*  --   PROT 6.7  --  6.0*  --  6.5  --   --  5.8*  --  5.4*  --   ALBUMIN 3.1*   < > 2.9*   < > 2.9*   < > 2.8* 2.7*  2.8* 2.5* 2.6*  2.6* 2.3*   < > = values in this interval not displayed.   No results for input(s): LIPASE, AMYLASE in the last 168 hours. Recent Labs  Lab 2020-02-10 0050   AMMONIA 55*   CBC: Recent Labs  Lab 01/25/20 0546 01/25/20 0546 01/26/20 0525 01/26/20 0525 01/27/20 0440 01/27/20 0440 01/28/20 0400 01/28/20 1256 2020-02-10 0050 2020-02-10 0800 Feb 10, 2020 1205  WBC 38.2*  --  46.9*  --  54.6*  --  56.6*  --  53.8*  --   --   NEUTROABS 36.3*  --  43.5*  --  51.0*  --  52.8*  --  49.4*  --   --   HGB 16.4   < > 14.9   < > 15.6   < > 15.0 14.2 14.3 14.2 13.9  HCT 49.5   < > 44.2   < > 46.0   < > 45.5 42.8 43.7 43.2 42.6  MCV 94.8  --  94.4  --  93.7  --  96.4  --  98.4  --   --   PLT 135*  --  147*  --  191  --  140*  --  144*  --   --    < > = values in this interval not displayed.   Cardiac Enzymes: No results for input(s): CKTOTAL, CKMB, CKMBINDEX, TROPONINI in the last 168 hours. Sepsis Labs: Recent Labs  Lab 01/26/20 0525 01/27/20 0440 01/28/20 0400 02/10/20 0050  WBC 46.9* 54.6* 56.6* 53.8*    [place] DO 01/30/2020, 7:48  AM

## 2020-02-03 NOTE — Progress Notes (Signed)
[facility]  Record reviewed, patient seen briefly in ICU this morning.  He is very weak, not moving or eating. Mr. Pund' condition has continued to detiriorate.  He cannot recover from this illness. Agree with palliative care consultation, focusing on comfort measures.  Roselie Awkward, MD [facility] Pager: [phone] Cell: [phone] If no response, call [phone]

## 2020-02-03 NOTE — Progress Notes (Signed)
[facility] Progress Note Patient Name: Christopher Travis DOB: 06-20-1946 MRN: [id number]   Date of Service  Feb 09, 2020  HPI/Events of Note  Irregular Rhythm - Looks like blocked APC's on monitor. However, hard to tell.   eICU Interventions  Will order: 1. 12 Lead EKG now.  2. Send AM labs early.      Intervention Category Major Interventions: Arrhythmia - evaluation and management  [name] Cornelia Copa 2020-02-09, 12:31 AM

## 2020-02-03 NOTE — Progress Notes (Signed)
PROGRESS NOTE    Christopher Travis  [id number] DOB: 1946-11-18 DOA: 01/21/2020 PCP: System, Pcp Not In   Brief Narrative:  Christopher Travis is an 74 y.o.  Hispanic male PMHx Parkinson's disease and non-Hodgkin's lymphoma in remission who developed subjective fever malaise about 1 week prior to admission, started developing shortness of breath about 2 to 3 days prior to admission - at home his sats began reading 80% presented to [facility] found to be hypoxic,  Tachypneic chest x-ray showed bilateral infiltrates, SARS-CoV-2 PCR, his creatinine was 1.3 (up from baseline 1.0) elevated bilirubin with a leukocytosis lactic acid of 4.3 procalcitonin 2.5 D-dimer 1.28 blood cultures ordered was fluid resuscitated started on IV Decadron on remdesivir.   Subjective: Worsening mental status, nonverbal, poorly following commands - Levophed initiated overnight. Continues to pull at oxygen and appears uncomfortable. ROS markedly limited given patient's current status.  Assessment & Plan:   Principal Problem:   Pneumonia due to COVID-19 virus Active Problems:   Parkinson disease ([place])   Hypertension   AKI (acute kidney injury) ([place])   Severe sepsis ([place])   Hyperbilirubinemia   Acute respiratory failure with hypoxia (HCC)   Parkinson's disease ([place])   Palliative care encounter   HCAP (healthcare-associated pneumonia)   Leukocytosis   Upper GI bleed   Thrush of mouth and esophagus ([place])   Acute renal failure (ARF) ([place])   Liver failure ([place])  Covid pneumonia/Acute Respiratory Failure with hypoxia/HCAP Recent Labs    01/27/20 0440 01/28/20 0400 2020-02-13 0050  DDIMER 12.96* 9.93* 9.64*  FERRITIN 642* >7,500* >7,500*  CRP 8.2* 5.4* 3.8*   - 2/13 Covid convalescent plasma x2 doses - Decadron completed - Remdesivir completed - 2/15 Actemra x1 dose - Titrate O2 to maintain SPO2 85% if possible. - Prone patient up to 16 hours/day as tolerated; if necessary prone in  2 to 3 hour sessions -  2/19 Fentanyl 50 mcg q 4 hr PRN - 2/20 PCXR; multi lobar pneumonia see results below  Septic shock - Unknown source HCAP vs bacteremia vs UTI vs unknown etiology - Leukocytosis but remains afebrile - 2/17 PCXR; worsening groundglass opacification see results below - 2/18 patient's respiratory status continues to deteriorate will DC CAP coverage and start HCAP coverage x7 days. - 2/22 respiratory status stabilized.  Patient now on [place] only; per RN at night occasionally requires addition of NRB to maintain sats. - 2/23 - overnight BP continues to fall - now on levophed - 2/24 - now on NRB and HFNC to maintain sats in high 80s  Thrush - Fluconazole IV complete 5-day course  Acute LEFT lower extremity DVT/Acute RIGHT lower extremity SVT - Treatment dose heparin drip. - Unable to verify if patient has PE spoke with daughter who confirms has true iodine allergy, therefore no CTA PE protocol.  Chronic diastolic CHF - Strict in and out continues to be positive given IV fluid burden w/ medications - Daily weight Filed Weights   01/27/20 0500 01/28/20 0500 2020-02-13 0500  Weight: 80.9 kg 82 kg 79.2 kg  - D5W 19ml/hr  Essential hypertension: Hypotension - Holding antihypertensive medication due to the sepsis picture. - Patient has had multiple episode of tachycardia/bradycardia.  Sick sinus syndrome? - Echocardiogram positive for chronic diastolic CHF see results below - 2/23 Levophed restarted overnight.  MAP goal> 60, will titrate down as tolerated  Sick sinus syndrome - Bradycardia has resolved therefore rules out sick sinus syndrome. Chronic diastolic CHF see above.  Acute kidney injury:/Anuric  Recent Labs  Lab 01/27/20 1535 01/27/20 1830 01/28/20 0400 01/28/20 1656 February 02, 2020 0050  CREATININE 2.16* 2.05* 1.74*  1.72* 1.68* 1.50*  - See CHF - 2/18 renal ultrasound nondiagnostic see results below - 2/18 Lasix IV 60 mg x 1 - Patient's renal status has continued to worsen  patient now uremic.  Spoke with Dr. Rodman Key nephrology about initiating CRRT. She concurs patient would benefit from CRRT - 2/23 Anuric continue CRRT per nephrology - 2/24 Hold CRRT per nephrology  Acute uremic Encephalopathy -Trend BUN -Alert appears to be appropriately answering questions with nod of his head.    Hyperbilirubinemia/Liver failure -Patient's bilirubin continues to climb CMP Latest Ref Rng & Units 02-Feb-2020 01/28/2020 01/28/2020  Glucose 70 - 99 mg/dL 115(H) 141(H) 180(H)  BUN 8 - 23 mg/dL 40(H) 46(H) 59(H)  Creatinine 0.61 - 1.24 mg/dL 1.50(H) 1.68(H) 1.74(H)  Sodium 135 - 145 mmol/L 138 137 138  Potassium 3.5 - 5.1 mmol/L 4.3 4.6 4.5  Chloride 98 - 111 mmol/L 102 99 100  CO2 22 - 32 mmol/L 16(L) 18(L) 21(L)  Calcium 8.9 - 10.3 mg/dL 7.5(L) 7.5(L) 8.1(L)  Total Protein 6.5 - 8.1 g/dL 5.4(L) - 5.8(L)  Total Bilirubin 0.3 - 1.2 mg/dL 7.0(H) - 5.7(H)  Alkaline Phos 38 - 126 U/L 409(H) - 317(H)  AST 15 - 41 U/L 1,899(H) - 606(H)  ALT 0 - 44 U/L 75(H) - 29   Recent Labs  Lab 01/27/20 0440 01/28/20 0400 2020/02/02 0050  INR 1.3* 1.5* 1.7*  -Avoid hepatotoxic medication -2/23-24th liver failure continues to worsen, despite maximal treatment.  Parkinson's disease -At home on combination of Sinemet IR 50-200 and  Sinemet CR 100-400mg .  -Sinemet per pharmacy.  Changes will be made to home dosing secondary to Sinemet CR not being able to be crushed and placed down tube  Hyperkalemia -2/22 discussed with Dr. Roney Jaffe Nephrology. Will change bath and CRRT to decrease potassium -2/22 calcium gluconate 1 g  -2/23 stabilized on CRRT  Thrombocytopenia CBC Latest Ref Rng & Units 2020-02-02 Feb 02, 2020 Feb 02, 2020  WBC 4.0 - 10.5 K/uL - - 53.8(HH)  Hemoglobin 13.0 - 17.0 g/dL 13.9 14.2 14.3  Hematocrit 39.0 - 52.0 % 42.6 43.2 43.7  Platelets 150 - 400 K/uL - - 144(L)  -2/23 trended down slightly today monitor closely given patient's upper GI bleed  Leukocytosis,  worsening - likely leukemoid reaction -2/21 most likely Leukemoid Reaction/Demargination.   -2/22 positive mild left shift.  Negative fever, patient has been hypothermic.  Cultures to date NGTD see results below -2/22 phone consult Dr. Carlyle Basques, ID discussed case concurs that most likely leukemoid reaction.  Upper GI bleed, stable -2/22 1 episode of tarry stool reported.  Fecal occult positive Recent Labs  Lab 01/26/20 0525 01/27/20 0440 01/28/20 0400 01/28/20 1256 02/02/2020 0050  HGB 14.9 15.6 15.0 14.2 14.3  -Protonix IV 40 mg BID -Hg stable -Decrease heparin to minimal therapeutic level -H/H TID  Dysphagia -2/22 consulted dietitian; all tube feed formulas, with morning concerning iodine allergy.  Since patient has ANAPHYLACTIC reaction to iodine discussed case with pharmacy.  Pharmacy contacted [employer] regarding Vital HP and they confirmed that it does include<1% marine oil and potassium iodide.Marland KitchenMarland KitchenAlthough it might be a very small amount in the formula that could cause a potential reaction, they couldn't advise using it.  Consulted dietitian on recommendation for feeds for patient. -2/23 NG tube pulled overnight.  Swallow evaluation pending if patient can swallow medication will hold off and have core track  placed in A.m. if patient unable to swallow medication safely will replace NG tube today.  Goals of care/ethics: - Long discussion with the family the patient on 01/19/2020, expressed that he did not want to be intubated or resuscitated this was expressed to the family.  I have expressed to the family my concerns about his extremely poor prognosis they would like to talk it over and give it a few more hours to see if he turns around to have explained to him that it is unlikely but we would do as they wish.  We will call him again this afternoon. - 2/17 consult palliative care; patient with extremely poor prognosis family does not want to escalate care.  Comfort care vs home hospice  vs residential hospice  - 2/20 again spoke with family who continues to push for reversal of DNR.  Again informed family that if they could convince patient to reverse DNR then we will honor that request.  Unfortunately currently patient uremic A/O x1, unable to have an in-depth conversation with family concerning reversal of his previous DNR request.  Christopher Travis (daughter) is adamant that father did not understand when he made himself DNR because his native language is Spanish, however Dr. Tammi Klippel physician who explained CODE STATUS to patient native language is Spanish.   - 2/21 patient now on CRRT x24 hours patient with extremely poor prognosis for surviving hospitalization - 2/24 Lengthy family discussion about ongoing demise of patient. Family had another visit with patient given worsening status and cardiac dysrhythmia overnight-patient remains high risk for decompensation.  Attempted to discuss worsening condition of patient and bridge discussion to hospice or palliative care, however family continues to focus on prolonging patient's life which at this point is not my primary goal as the patient appears uncomfortable and dyspneic. Palliative to discuss with family as well - we spoke about patient's very grim outlook.  DVT prophylaxis: Heparin drip Code Status: DNR Family Communication: Updated daily Disposition Plan: TBD, suspect hospital demise at this point.  Consultants:  Dr. Rodman Key nephrology  PCCM Dr. Lake Bells phone consult Dr. Carlyle Basques, ID   Procedures/Significant Events:  2/13 Covid convalescent plasma x2 doses 2/15 Actemra x1 dose 2/16 bilateral lower extremity Doppler;RIGHT: Positive acute SVT involving the  right great saphenous vein, and right varicosities or other superficial veins.  LEFT: Positive acute DVT involving the left gastrocnemius veins.  2/17 PCXR; low lung volume with worsening of LEFT>> RIGHT interstitial and ground-glass opacities concerning for bilateral  pneumonia 2/18 echocardiogram;.Left Ventricle: EF= 60 -65%. The left ventricle has normal function. The left ventricle has no regional wall motion abnormalities.  -Grade I diastolic dysfunction (impaired relaxation).  -There is mildly elevated pulmonary artery systolic pressure.    2/19 renal US; No acute or focal abnormality identified. No hydronephrosis. 4 cm simple cyst left kidney 2/20; Precedex started overnight 2/21 PCXR;-multilobar bilateral pneumonia with similar aeration to the prior Study -Left internal jugular Vas-Cath with tip terminating at the superior cavoatrial junction. 2/21 NG tube placed Levophed 2/21>>2/22 Precedex to 2/21>> 2/22 Levophed 2/22>>>   I have personally reviewed and interpreted all radiology studies and my findings are as above.  VENTILATOR SETTINGS: [place] 2/23  FiO2 100% Flow; 40 L/min SPO2; 100%   Cultures 2/12 blood 1/2 positive coag negative staph most likely contaminant 2/12 SARS coronavirus 2 positive 2/17 sputum pending 2/17 Blood RIGHT AC negative final 2/17 blood LEFT AC negative final 2/18 MRSA by PCR negative 2/19 urine negative 2/20 acute hepatitis panel  pending 2/20 HIV panel negative    Antimicrobials: Anti-infectives (From admission, onward)   Start     Dose/Rate Stop   01/25/20 2200  ceFEPIme (MAXIPIME) 2 g in sodium chloride 0.9 % 100 mL IVPB     2 g 200 mL/hr over 30 Minutes Feb 07, 2020 2359   01/24/20 1000  fluconazole (DIFLUCAN) IVPB 100 mg     100 mg 50 mL/hr over 60 Minutes 01/28/20 2359   01/23/20 1630  fluconazole (DIFLUCAN) IVPB 200 mg     200 mg 100 mL/hr over 60 Minutes 01/23/20 1924   01/23/20 0800  ceFEPIme (MAXIPIME) 2 g in sodium chloride 0.9 % 100 mL IVPB  Status:  Discontinued     2 g 200 mL/hr over 30 Minutes 01/25/20 1433   01/18/20 1000  remdesivir 100 mg in sodium chloride 0.9 % 100 mL IVPB     100 mg 200 mL/hr over 30 Minutes 01/21/20 1106   01/18/20 0400  azithromycin (ZITHROMAX) 500 mg in  sodium chloride 0.9 % 250 mL IVPB     500 mg 250 mL/hr over 60 Minutes 01/22/20 0422   01/18/20 0300  cefTRIAXone (ROCEPHIN) 2 g in sodium chloride 0.9 % 100 mL IVPB     2 g 200 mL/hr over 30 Minutes 01/22/20 0317   01/12/2020 2045  remdesivir 100 mg in sodium chloride 0.9 % 100 mL IVPB     100 mg 200 mL/hr over 30 Minutes 01/08/2020 2232       Devices NG tube placed 2/21>>   LINES  LEFT IJ Vas-Cath 2/20>>    Continuous Infusions: .  prismasol BGK 4/2.5 500 mL/hr at 01/28/20 1540  .  prismasol BGK 4/2.5 200 mL/hr at 01/28/20 2025  . sodium chloride    . ceFEPime (MAXIPIME) IV 2 g (2020-02-07 0750)  . dexmedetomidine (PRECEDEX) IV infusion Stopped (01/27/20 0523)  . dextrose 50 mL/hr at 02-07-2020 0600  . feeding supplement (PIVOT 1.5 CAL) Stopped (02/07/20 0728)  . heparin 850 Units/hr (02/07/20 0600)  . norepinephrine (LEVOPHED) Adult infusion 16 mcg/min (02/07/20 0600)  . prismasol BGK 2/2.5 dialysis solution 2,000 mL/hr at 01/28/20 2301     Objective: Vitals:   Feb 07, 2020 0615 2020-02-07 0630 02/07/20 0645 2020-02-07 0700  BP: 104/65 (!) 100/56 (!) 103/58 98/60  Pulse: 95 100 95 (!) 118  Resp: (!) 25 (!) 26 (!) 22 (!) 25  Temp:    98.6 F (37 C)  TempSrc:    Axillary  SpO2: 92% (!) 89% 92% 94%  Weight:      Height:        Intake/Output Summary (Last 24 hours) at 02-07-20 0829 Last data filed at 2020/02/07 0734 Gross per 24 hour  Intake 1870.79 ml  Output 517 ml  Net 1353.79 ml   Filed Weights   01/27/20 0500 01/28/20 0500 02/07/20 0500  Weight: 80.9 kg 82 kg 79.2 kg    General: Alert, interactive, positive acute respiratory distress Eyes: negative scleral hemorrhage, negative anisocoria, negative icterus ENT: Negative Runny nose, negative gingival bleeding, Neck:  Negative scars, masses, torticollis, lymphadenopathy, JVD Lungs: Tachypneic clear to auscultation bilaterally without wheezes or crackles Cardiovascular: Regular rate and rhythm without murmur gallop  or rub normal S1 and S2 Abdomen: negative abdominal pain, nondistended, positive soft, bowel sounds, no rebound, no ascites, no appreciable mass Extremities: No significant cyanosis, clubbing, positive +1  edema bilateral lower extremities Skin: Negative rashes, lesions, ulcers Psychiatric:  Negative depression, negative anxiety, negative fatigue, negative mania  Central nervous system:  Cranial nerves II through XII intact, tongue/uvula midline, all extremities muscle strength 5/5, sensation intact throughout, negative dysarthria, negative expressive aphasia, negative receptive aphasia.      Data Reviewed: Care during the described time interval was provided by me .  I have reviewed this patient's available data, including medical history, events of note, physical examination, and all test results as part of my evaluation.   CBC: Recent Labs  Lab 01/25/20 0546 01/25/20 0546 01/26/20 0525 01/27/20 0440 01/28/20 0400 01/28/20 1256 2020/02/26 0050  WBC 38.2*  --  46.9* 54.6* 56.6*  --  53.8*  NEUTROABS 36.3*  --  43.5* 51.0* 52.8*  --  49.4*  HGB 16.4   < > 14.9 15.6 15.0 14.2 14.3  HCT 49.5   < > 44.2 46.0 45.5 42.8 43.7  MCV 94.8  --  94.4 93.7 96.4  --  98.4  PLT 135*  --  147* 191 140*  --  144*   < > = values in this interval not displayed.   Basic Metabolic Panel: Recent Labs  Lab 01/26/20 0525 01/26/20 1620 01/27/20 0440 01/27/20 0440 01/27/20 1535 01/27/20 1830 01/28/20 0400 01/28/20 1656 2020-02-26 0050  NA 151*   < > 142   < > 140 140 138  136 137 138  K 4.9   < > 5.0   < > 6.2* 5.8* 4.5  4.2 4.6 4.3  CL 112*   < > 104   < > 105 104 100  99 99 102  CO2 22   < > 18*   < > 18* 17* 21*  20* 18* 16*  GLUCOSE 118*   < > 156*   < > 215* 179* 180*  178* 141* 115*  BUN 62*   < > 83*   < > 71* 69* 59*  58* 46* 40*  CREATININE 2.94*   < > 2.02*   < > 2.16* 2.05* 1.74*  1.72* 1.68* 1.50*  CALCIUM 7.7*   < > 8.2*   < > 8.1* 8.0* 8.1*  8.2* 7.5* 7.5*  MG 3.0*  --   2.8*  --   --  3.3* 2.8*  --  2.8*  PHOS 4.1   < > 4.7*  --  5.3*  --  4.5  4.6 5.3* 5.6*  5.5*   < > = values in this interval not displayed.   GFR: Estimated Creatinine Clearance: 45.3 mL/min (A) (by C-G formula based on SCr of 1.5 mg/dL (H)). Liver Function Tests: Recent Labs  Lab 01/25/20 0546 01/25/20 1615 01/26/20 0525 01/26/20 1620 01/27/20 0440 01/27/20 1535 01/28/20 0400 01/28/20 1656 2020/02/26 0050  AST 92*  --  100*  --  154*  --  606*  --  1,899*  ALT 15  --  14  --  20  --  29  --  75*  ALKPHOS 239*  --  225*  --  293*  --  317*  --  409*  BILITOT 3.3*  --  3.9*  --  5.4*  --  5.7*  --  7.0*  PROT 6.7  --  6.0*  --  6.5  --  5.8*  --  5.4*  ALBUMIN 3.1*   < > 2.9*   < > 2.9* 2.8* 2.7*  2.8* 2.5* 2.6*  2.6*   < > = values in this interval not displayed.   No results for input(s): LIPASE, AMYLASE in the last 168 hours. Recent Labs  Lab 2020-02-26 0050  AMMONIA 55*  Coagulation Profile: Recent Labs  Lab 01/27/20 0440 01/28/20 0400 02-06-2020 0050  INR 1.3* 1.5* 1.7*   Cardiac Enzymes: No results for input(s): CKTOTAL, CKMB, CKMBINDEX, TROPONINI in the last 168 hours. BNP (last 3 results) No results for input(s): PROBNP in the last 8760 hours. HbA1C: No results for input(s): HGBA1C in the last 72 hours. CBG: Recent Labs  Lab 01/28/20 1658 01/28/20 1935 February 06, 2020 0020 2020/02/06 0528 02/06/20 0739  GLUCAP 137* 122* 96 148* 170*   Lipid Profile: No results for input(s): CHOL, HDL, LDLCALC, TRIG, CHOLHDL, LDLDIRECT in the last 72 hours. Thyroid Function Tests: No results for input(s): TSH, T4TOTAL, FREET4, T3FREE, THYROIDAB in the last 72 hours. Anemia Panel: Recent Labs    01/28/20 0400 2020/02/06 0050  FERRITIN >7,500* >7,500*   Urine analysis: No results found for: COLORURINE, APPEARANCEUR, LABSPEC, PHURINE, GLUCOSEU, HGBUR, BILIRUBINUR, KETONESUR, PROTEINUR, UROBILINOGEN, NITRITE, LEUKOCYTESUR Sepsis  Labs: @LABRCNTIP (procalcitonin:4,lacticidven:4)  ) Recent Results (from the past 240 hour(s))  MRSA PCR Screening     Status: None   Collection Time: 01/20/20  9:40 AM   Specimen: Nasopharyngeal  Result Value Ref Range Status   MRSA by PCR NEGATIVE NEGATIVE Final    Comment:        The GeneXpert MRSA Assay (FDA approved for NASAL specimens only), is one component of a comprehensive MRSA colonization surveillance program. It is not intended to diagnose MRSA infection nor to guide or monitor treatment for MRSA infections. Performed at [facility], [place] [address], [place], [place] [address]   Culture, blood (routine x 2)     Status: None   Collection Time: 01/22/20  7:54 PM   Specimen: BLOOD  Result Value Ref Range Status   Specimen Description BLOOD RIGHT ANTECUBITAL  Final   Special Requests   Final    BOTTLES DRAWN AEROBIC AND ANAEROBIC Blood Culture adequate volume   Culture   Final    NO GROWTH 5 DAYS Performed at [facility], [place] [address] [place], [place] [address]    Report Status 01/27/2020 FINAL  Final  Culture, blood (routine x 2)     Status: None   Collection Time: 01/22/20  8:04 PM   Specimen: BLOOD  Result Value Ref Range Status   Specimen Description BLOOD LEFT ANTECUBITAL  Final   Special Requests   Final    BOTTLES DRAWN AEROBIC ONLY Blood Culture results may not be optimal due to an inadequate volume of blood received in culture bottles   Culture   Final    NO GROWTH 5 DAYS Performed at [facility], [place] [address], [place], [place] [address]    Report Status 01/27/2020 FINAL  Final  MRSA PCR Screening     Status: None   Collection Time: 01/23/20  9:20 AM   Specimen: Nasal Mucosa; Nasopharyngeal  Result Value Ref Range Status   MRSA by PCR NEGATIVE NEGATIVE Final    Comment:        The GeneXpert MRSA Assay (FDA approved for NASAL specimens only), is one component of a comprehensive MRSA colonization surveillance  program. It is not intended to diagnose MRSA infection nor to guide or monitor treatment for MRSA infections. Performed at [facility], [place] [address], [place],  [address]   Culture, Urine     Status: None   Collection Time: 01/24/20  4:43 PM   Specimen: Urine, Clean Catch  Result Value Ref Range Status   Specimen Description   Final  URINE, CLEAN CATCH Performed at [facility], [place] [address] [place], [place] [address]    Special Requests   Final    NONE Performed at [facility], [place] [address] [place], [place] [address]    Culture   Final    NO GROWTH Performed at [facility], [place] [address], [place], [place] [address]    Report Status 01/25/2020 FINAL  Final  MRSA PCR Screening     Status: None   Collection Time: 01/27/20  6:30 PM   Specimen: Nasopharyngeal Wash  Result Value Ref Range Status   MRSA by PCR NEGATIVE NEGATIVE Final    Comment:        The GeneXpert MRSA Assay (FDA approved for NASAL specimens only), is one component of a comprehensive MRSA colonization surveillance program. It is not intended to diagnose MRSA infection nor to guide or monitor treatment for MRSA infections. Performed at [facility], [place] [address], [place], [place] [address]          Radiology Studies: DG CHEST PORT 1 VIEW  Result Date: 01/28/2020 CLINICAL DATA:  Respiratory failure EXAM: PORTABLE CHEST 1 VIEW COMPARISON:  01/25/2020 FINDINGS: Left IJ central line is unchanged. Persistent interstitial opacities with improving lung aeration. No pleural effusion or pneumothorax. Stable cardiomediastinal contours. IMPRESSION: Persistent interstitial opacities with improving aeration. Electronically Signed   By: Macy Mis M.D.   On: 01/28/2020 09:17        Scheduled Meds: . carbidopa-levodopa  3 tablet Per Tube 3 times per day   And  . carbidopa-levodopa  2 tablet Per Tube QHS    . chlorhexidine  15 mL Mouth Rinse BID  . Chlorhexidine Gluconate Cloth  6 each Topical Daily  . insulin aspart  0-9 Units Subcutaneous TID WC  . mouth rinse  15 mL Mouth Rinse 10 times per day  . metoprolol tartrate  50 mg Oral BID  . pantoprazole (PROTONIX) IV  40 mg Intravenous Q12H  . rOPINIRole  3 mg Oral Q24H  . sodium chloride flush  10-40 mL Intracatheter Q12H  . sodium chloride flush  3 mL Intravenous Q12H  . sodium chloride flush  3 mL Intravenous Q12H  . Thrombi-Pad  1 each Topical Once   Continuous Infusions: .  prismasol BGK 4/2.5 500 mL/hr at 01/28/20 1540  .  prismasol BGK 4/2.5 200 mL/hr at 01/28/20 2025  . sodium chloride    . ceFEPime (MAXIPIME) IV 2 g (02-13-2020 0750)  . dexmedetomidine (PRECEDEX) IV infusion Stopped (01/27/20 0523)  . dextrose 50 mL/hr at 02/13/20 0600  . feeding supplement (PIVOT 1.5 CAL) Stopped (2020-02-13 0728)  . heparin 850 Units/hr (13-Feb-2020 0600)  . norepinephrine (LEVOPHED) Adult infusion 16 mcg/min (02/13/2020 0600)  . prismasol BGK 2/2.5 dialysis solution 2,000 mL/hr at 01/28/20 2301     LOS: 11 days   The patient is critically ill with multiple organ systems failure and requires high complexity decision making for assessment and support, frequent evaluation and titration of therapies, application of advanced monitoring technologies and extensive interpretation of multiple databases. Critical Care Time devoted to patient care services described in this note  Time spent: 40 minutes     Little Ishikawa, MD [facility] Pager [phone]  If 7PM-7AM, please contact night-coverage [email or web address] Password [facility] 02/13/2020, 8:29 AM

## 2020-02-03 NOTE — Progress Notes (Signed)
Dr. Augustin Coupe notified of evening events. Reviewed morning labs stated it was ok to hold off CRRT and revaluate in the morning.   Daughter [name] called and given this update

## 2020-02-03 NOTE — Progress Notes (Signed)
Had extended conversation with patient's daughter on the phone regarding patient's current condition and plan of care. Patient's state of actively dying was explained to patient's daughter. [name] was very frustrated and stated multiple times she felt her father was not receiving proper care. Per several previous notes this was a persistent theme. This RN provided emotional support and explained that we were doing everything within the patient's code status to support the patient.

## 2020-02-03 NOTE — Progress Notes (Signed)
[facility] Progress Note Patient Name: Christopher Travis DOB: 01/10/1946 MRN: [id number]   Date of Service  Feb 12, 2020  HPI/Events of Note  Request for thrombi-pad.  eICU Interventions  Will order thrombi-pad.     Intervention Category Major Interventions: Other:  Pecola Haxton Cornelia Copa 02-12-20, 6:28 AM

## 2020-02-03 NOTE — Progress Notes (Signed)
[facility] Progress Note    Assessment/ Plan:   1.  Acute oliguric kidney injury: Cr up to 4.11 with BUN up to 185. Started CRRT 2/20. Had sig ectopy last night and CRRT was dc'd.   - possible comfort care  - labs okay, cont to hold CRRT for now  2.  Hyperkalemia: better   3.  Acute hypoxic RF: d/t COVID and probable PE, on decadron, s/p remdesivir, on cefepime and conazole and hep gtt  4.  Parkinson's: on Sinemet  5.  Thrombocytopenia: plts 105, HIT negative  6.  Elevated LFTs: likely d/t COVID infection, w/u per primary  Kelly Splinter, MD 02/23/20, 2:43 PM       Subjective:     Patient not examined today directly given COVID-19 + status, utilizing data taken from chart +/- discussions w/ providers and staff.     Objective:   BP (!) 77/37   Pulse 73   Temp 98.8 F (37.1 C) (Axillary)   Resp (!) 26   Ht 5\' 11"  (1.803 m)   Wt 79.2 kg   SpO2 93%   BMI 24.35 kg/m   Intake/Output Summary (Last 24 hours) at February 23, 2020 1441 Last data filed at 02/23/20 0734 Gross per 24 hour  Intake 1430.1 ml  Output 421 ml  Net 1009.1 ml   Weight change: -2.8 kg  Physical Exam:  Patient not examined today directly given COVID-19 + status, utilizing data taken from chart +/- discussions w/ providers and staff.      Imaging: DG CHEST PORT 1 VIEW  Result Date: 01/28/2020 CLINICAL DATA:  Respiratory failure EXAM: PORTABLE CHEST 1 VIEW COMPARISON:  01/25/2020 FINDINGS: Left IJ central line is unchanged. Persistent interstitial opacities with improving lung aeration. No pleural effusion or pneumothorax. Stable cardiomediastinal contours. IMPRESSION: Persistent interstitial opacities with improving aeration. Electronically Signed   By: Macy Mis M.D.   On: 01/28/2020 09:17    Labs: BMET Recent Labs  Lab 01/26/20 0525 01/26/20 0525 01/26/20 1620 01/27/20 0440 01/27/20 1535 01/27/20 1830 01/28/20 0400 01/28/20 1656 02/23/20 0050  NA 151*   < >  145 142 140 140 138  136 137 138  K 4.9   < > 5.2* 5.0 6.2* 5.8* 4.5  4.2 4.6 4.3  CL 112*   < > 110 104 105 104 100  99 99 102  CO2 22   < > 23 18* 18* 17* 21*  20* 18* 16*  GLUCOSE 118*   < > 137* 156* 215* 179* 180*  178* 141* 115*  BUN 62*   < > 65* 83* 71* 69* 59*  58* 46* 40*  CREATININE 2.94*   < > 2.11* 2.02* 2.16* 2.05* 1.74*  1.72* 1.68* 1.50*  CALCIUM 7.7*   < > 7.4* 8.2* 8.1* 8.0* 8.1*  8.2* 7.5* 7.5*  PHOS 4.1  --  4.1 4.7* 5.3*  --  4.5  4.6 5.3* 5.6*  5.5*   < > = values in this interval not displayed.   CBC Recent Labs  Lab 01/26/20 0525 01/26/20 0525 01/27/20 0440 01/27/20 0440 01/28/20 0400 01/28/20 0400 01/28/20 1256 02/23/20 0050 2020-02-23 0800 02/23/20 1205  WBC 46.9*  --  54.6*  --  56.6*  --   --  53.8*  --   --   NEUTROABS 43.5*  --  51.0*  --  52.8*  --   --  49.4*  --   --   HGB 14.9   < > 15.6   < >  15.0   < > 14.2 14.3 14.2 13.9  HCT 44.2   < > 46.0   < > 45.5   < > 42.8 43.7 43.2 42.6  MCV 94.4  --  93.7  --  96.4  --   --  98.4  --   --   PLT 147*  --  191  --  140*  --   --  144*  --   --    < > = values in this interval not displayed.    Medications:    . carbidopa-levodopa  3 tablet Per Tube 3 times per day   And  . carbidopa-levodopa  2 tablet Per Tube QHS  . chlorhexidine  15 mL Mouth Rinse BID  . Chlorhexidine Gluconate Cloth  6 each Topical Daily  . insulin aspart  0-9 Units Subcutaneous TID WC  . mouth rinse  15 mL Mouth Rinse 10 times per day  . metoprolol tartrate  50 mg Oral BID  . pantoprazole (PROTONIX) IV  40 mg Intravenous Q12H  . rOPINIRole  3 mg Oral Q24H  . sodium chloride flush  10-40 mL Intracatheter Q12H  . sodium chloride flush  3 mL Intravenous Q12H  . sodium chloride flush  3 mL Intravenous Q12H  . Thrombi-Pad  1 each Topical Once

## 2020-02-03 DEATH — deceased

## 2021-09-11 IMAGING — DX DG CHEST 1V PORT
1 series · 1 of 1 positions shown · non-contrast
Comparison: Chest radiograph 01/21/2019

CLINICAL DATA: Pneumonia. Orogastric tube placement.

EXAM:
PORTABLE CHEST 1 VIEW

[chest]
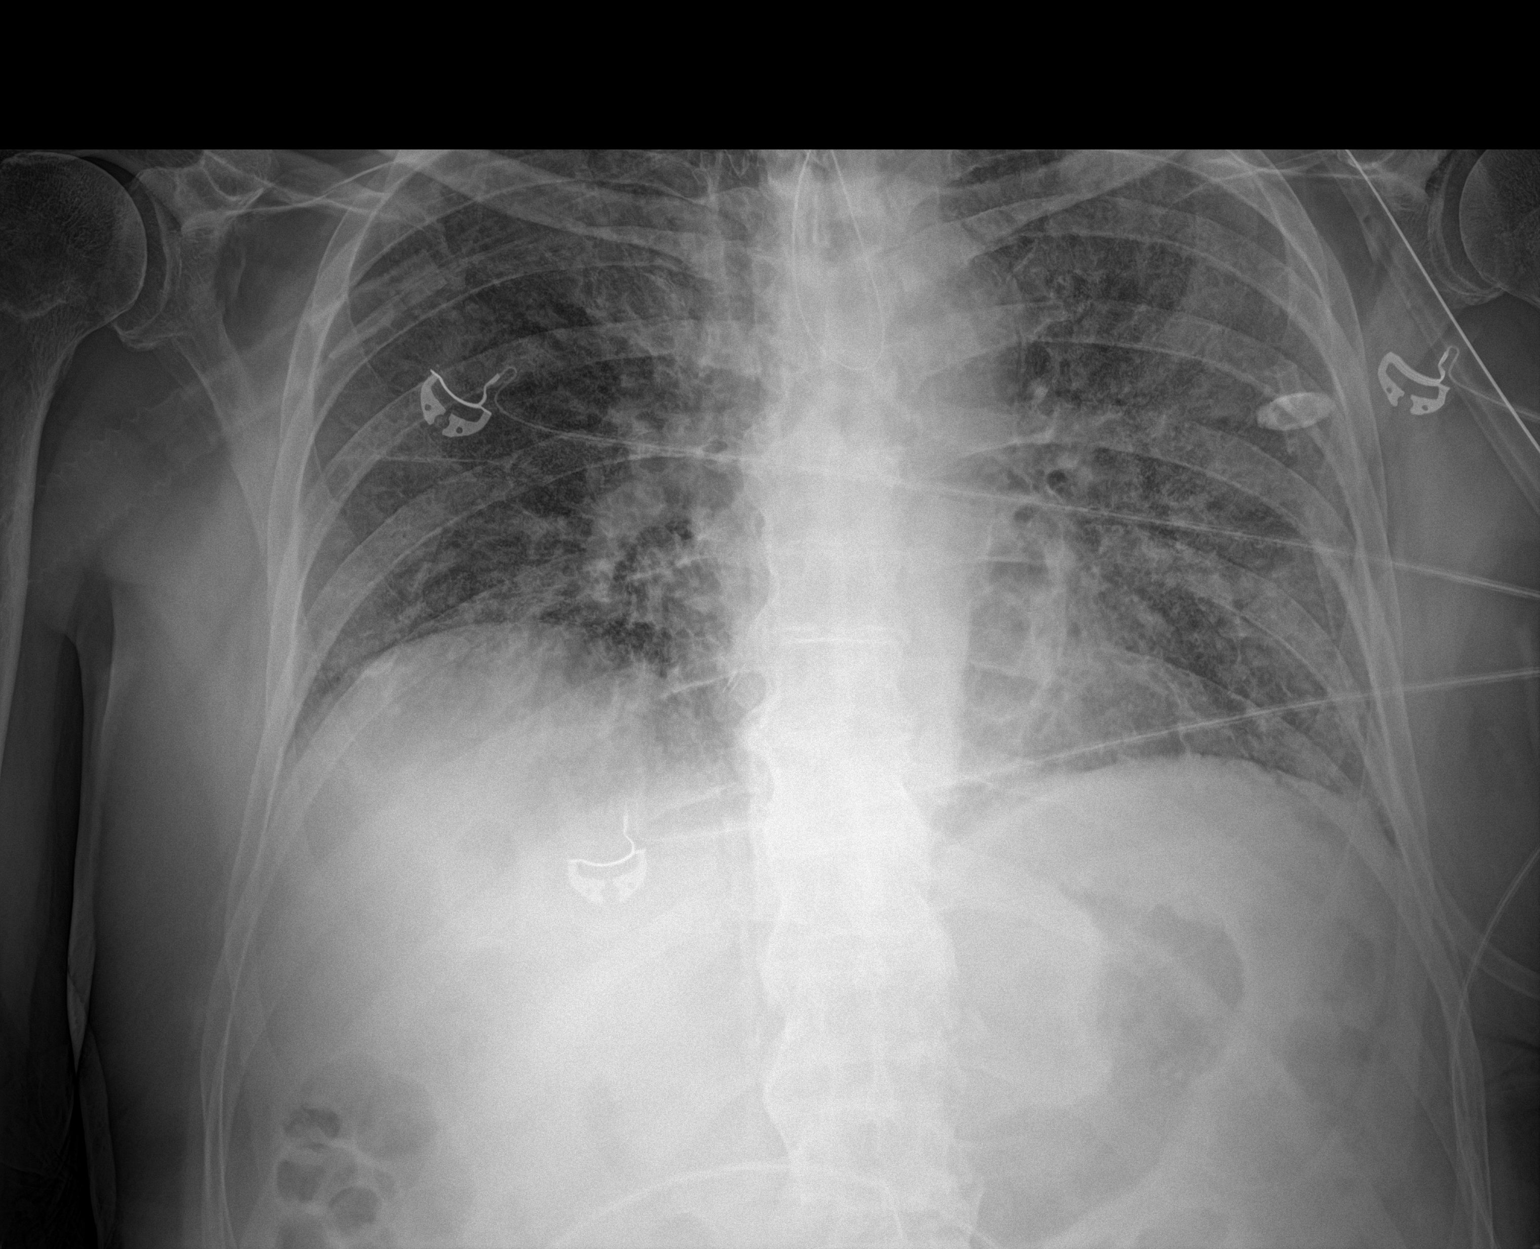

[1 of 1 positions shown; findings below may reference images not displayed]

FINDINGS: The enteric tube is coiled in the mid upper esophagus, tip not
included in the field of view. Low lung volumes with diffuse
heterogeneous bilateral lung opacities, not significantly changed.
Unchanged heart size and mediastinal contours. No pneumothorax or
large pleural effusion.
IMPRESSION: 1. Enteric tube coiled in the mid upper esophagus, tip not included
in the field of view. Recommend repositioning.
2. Unchanged diffuse heterogeneous bilateral lung opacities
consistent with COVID pneumonia

## 2021-09-12 IMAGING — DX DG CHEST 1V PORT
1 series · 1 of 1 positions shown · non-contrast
Comparison: Chest x-ray 01/24/2020.

CLINICAL DATA: 73-year-old male status post central line placement.

EXAM:
PORTABLE CHEST 1 VIEW

[chest ap]
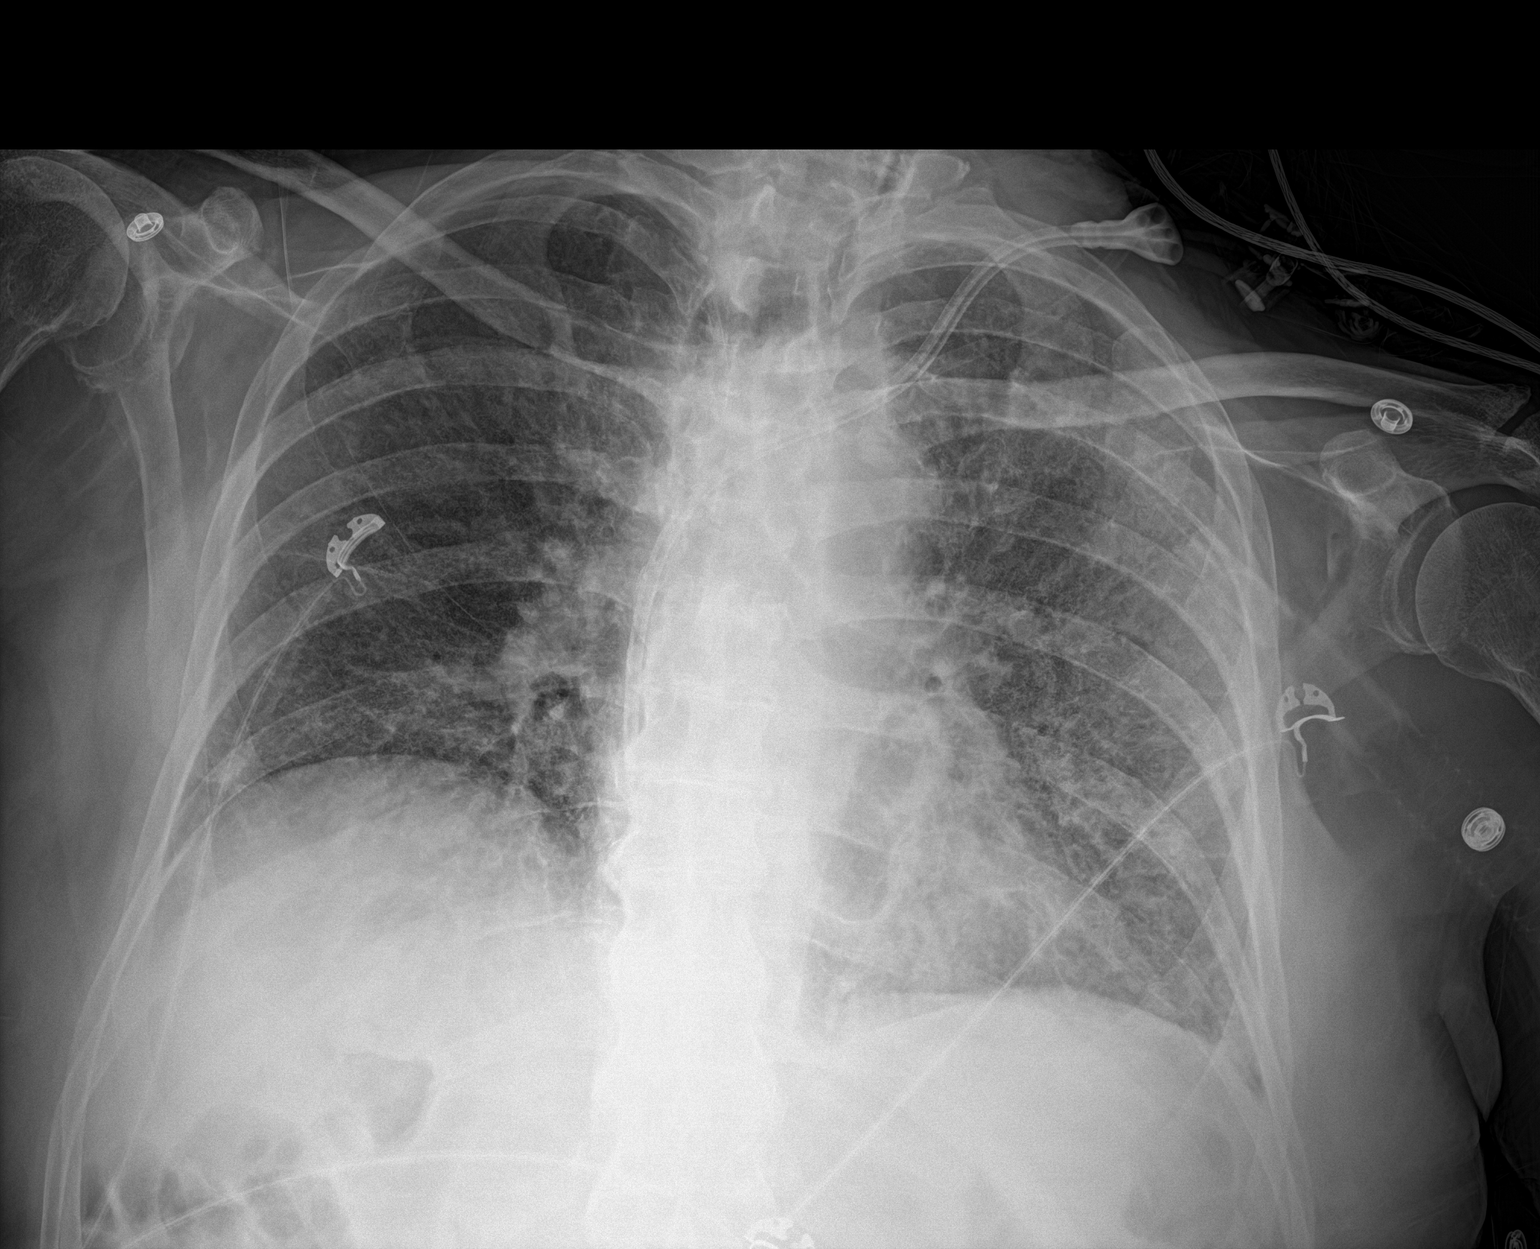

[1 of 1 positions shown; findings below may reference images not displayed]

FINDINGS: Previously noted enteric tube has been removed. Left internal
jugular Vas-Cath with tip terminating at the superior cavoatrial
junction. Lung volumes are low. Widespread ill-defined opacities and
areas of interstitial prominence are again noted in the lungs
bilaterally (left greater than right), with similar aeration to the
prior study. No definite pleural effusions. No pneumothorax. No
evidence of pulmonary edema. Heart size is normal. The patient is
rotated to the left on today's exam, resulting in distortion of the
mediastinal contours and reduced diagnostic sensitivity and
specificity for mediastinal pathology.
IMPRESSION: 1. Support apparatus, as above.
2. Multilobar bilateral pneumonia with similar aeration to the prior
study, as above.
